# Patient Record
Sex: Female | Born: 1935 | Race: White | Hispanic: No | Smoking: Never smoker
Health system: Southern US, Community
[De-identification: ages and names within clinical notes are randomized; demographics above are authoritative.]

## PROBLEM LIST (undated history)

## (undated) DIAGNOSIS — F419 Anxiety disorder, unspecified: Secondary | ICD-10-CM

## (undated) DIAGNOSIS — I639 Cerebral infarction, unspecified: Secondary | ICD-10-CM

## (undated) DIAGNOSIS — F329 Major depressive disorder, single episode, unspecified: Secondary | ICD-10-CM

## (undated) DIAGNOSIS — R21 Rash and other nonspecific skin eruption: Secondary | ICD-10-CM

## (undated) DIAGNOSIS — L821 Other seborrheic keratosis: Secondary | ICD-10-CM

## (undated) DIAGNOSIS — R3129 Other microscopic hematuria: Secondary | ICD-10-CM

## (undated) DIAGNOSIS — K922 Gastrointestinal hemorrhage, unspecified: Secondary | ICD-10-CM

## (undated) DIAGNOSIS — I10 Essential (primary) hypertension: Secondary | ICD-10-CM

## (undated) DIAGNOSIS — H539 Unspecified visual disturbance: Secondary | ICD-10-CM

## (undated) DIAGNOSIS — I442 Atrioventricular block, complete: Secondary | ICD-10-CM

## (undated) DIAGNOSIS — S32010A Wedge compression fracture of first lumbar vertebra, initial encounter for closed fracture: Secondary | ICD-10-CM

## (undated) DIAGNOSIS — J309 Allergic rhinitis, unspecified: Secondary | ICD-10-CM

## (undated) DIAGNOSIS — T8859XA Other complications of anesthesia, initial encounter: Secondary | ICD-10-CM

## (undated) DIAGNOSIS — N6019 Diffuse cystic mastopathy of unspecified breast: Secondary | ICD-10-CM

## (undated) DIAGNOSIS — T4145XA Adverse effect of unspecified anesthetic, initial encounter: Secondary | ICD-10-CM

## (undated) DIAGNOSIS — F32A Depression, unspecified: Secondary | ICD-10-CM

## (undated) DIAGNOSIS — M1990 Unspecified osteoarthritis, unspecified site: Secondary | ICD-10-CM

## (undated) HISTORY — DX: Rash and other nonspecific skin eruption: R21

## (undated) HISTORY — DX: Unspecified visual disturbance: H53.9

## (undated) HISTORY — DX: Anxiety disorder, unspecified: F41.9

## (undated) HISTORY — DX: Other seborrheic keratosis: L82.1

## (undated) HISTORY — DX: Wedge compression fracture of first lumbar vertebra, initial encounter for closed fracture: S32.010A

## (undated) HISTORY — DX: Major depressive disorder, single episode, unspecified: F32.9

## (undated) HISTORY — DX: Depression, unspecified: F32.A

## (undated) HISTORY — DX: Essential (primary) hypertension: I10

## (undated) HISTORY — PX: INSERT / REPLACE / REMOVE PACEMAKER: SUR710

## (undated) HISTORY — DX: Gastrointestinal hemorrhage, unspecified: K92.2

## (undated) HISTORY — DX: Unspecified osteoarthritis, unspecified site: M19.90

## (undated) HISTORY — DX: Diffuse cystic mastopathy of unspecified breast: N60.19

## (undated) HISTORY — DX: Allergic rhinitis, unspecified: J30.9

## (undated) HISTORY — DX: Atrioventricular block, complete: I44.2

## (undated) HISTORY — DX: Cerebral infarction, unspecified: I63.9

## (undated) HISTORY — DX: Other microscopic hematuria: R31.29

---

## 1999-02-10 ENCOUNTER — Encounter: Payer: Self-pay | Admitting: Obstetrics and Gynecology

## 1999-02-10 ENCOUNTER — Encounter: Admission: RE | Admit: 1999-02-10 | Discharge: 1999-02-10 | Payer: Self-pay | Admitting: Obstetrics and Gynecology

## 2000-02-12 ENCOUNTER — Encounter: Admission: RE | Admit: 2000-02-12 | Discharge: 2000-02-12 | Payer: Self-pay | Admitting: Obstetrics and Gynecology

## 2000-02-12 ENCOUNTER — Encounter: Payer: Self-pay | Admitting: Obstetrics and Gynecology

## 2001-02-13 ENCOUNTER — Encounter: Admission: RE | Admit: 2001-02-13 | Discharge: 2001-02-13 | Payer: Self-pay | Admitting: Obstetrics and Gynecology

## 2001-02-13 ENCOUNTER — Encounter: Payer: Self-pay | Admitting: Obstetrics and Gynecology

## 2002-02-15 ENCOUNTER — Encounter: Admission: RE | Admit: 2002-02-15 | Discharge: 2002-02-15 | Payer: Self-pay | Admitting: Obstetrics and Gynecology

## 2002-02-15 ENCOUNTER — Encounter: Payer: Self-pay | Admitting: Obstetrics and Gynecology

## 2002-08-15 ENCOUNTER — Encounter: Admission: RE | Admit: 2002-08-15 | Discharge: 2002-08-15 | Payer: Self-pay | Admitting: Internal Medicine

## 2002-08-15 ENCOUNTER — Encounter: Payer: Self-pay | Admitting: Internal Medicine

## 2003-02-18 ENCOUNTER — Encounter: Admission: RE | Admit: 2003-02-18 | Discharge: 2003-02-18 | Payer: Self-pay | Admitting: Obstetrics and Gynecology

## 2003-02-18 ENCOUNTER — Encounter: Payer: Self-pay | Admitting: Obstetrics and Gynecology

## 2004-02-19 ENCOUNTER — Encounter: Admission: RE | Admit: 2004-02-19 | Discharge: 2004-02-19 | Payer: Self-pay | Admitting: Obstetrics and Gynecology

## 2004-05-06 ENCOUNTER — Ambulatory Visit (HOSPITAL_COMMUNITY): Admission: RE | Admit: 2004-05-06 | Discharge: 2004-05-06 | Payer: Self-pay | Admitting: Cardiology

## 2005-02-26 ENCOUNTER — Encounter: Admission: RE | Admit: 2005-02-26 | Discharge: 2005-02-26 | Payer: Self-pay | Admitting: Obstetrics and Gynecology

## 2005-03-11 ENCOUNTER — Encounter: Admission: RE | Admit: 2005-03-11 | Discharge: 2005-03-11 | Payer: Self-pay | Admitting: Obstetrics and Gynecology

## 2006-02-28 ENCOUNTER — Encounter: Admission: RE | Admit: 2006-02-28 | Discharge: 2006-02-28 | Payer: Self-pay | Admitting: Obstetrics and Gynecology

## 2006-05-27 ENCOUNTER — Inpatient Hospital Stay (HOSPITAL_COMMUNITY): Admission: EM | Admit: 2006-05-27 | Discharge: 2006-05-31 | Payer: Self-pay | Admitting: Emergency Medicine

## 2007-03-03 ENCOUNTER — Encounter: Admission: RE | Admit: 2007-03-03 | Discharge: 2007-03-03 | Payer: Self-pay | Admitting: Internal Medicine

## 2008-01-15 ENCOUNTER — Encounter: Admission: RE | Admit: 2008-01-15 | Discharge: 2008-01-15 | Payer: Self-pay | Admitting: Family Medicine

## 2008-03-04 ENCOUNTER — Encounter: Admission: RE | Admit: 2008-03-04 | Discharge: 2008-03-04 | Payer: Self-pay | Admitting: Obstetrics and Gynecology

## 2009-03-05 ENCOUNTER — Encounter: Admission: RE | Admit: 2009-03-05 | Discharge: 2009-03-05 | Payer: Self-pay | Admitting: Obstetrics and Gynecology

## 2009-03-14 ENCOUNTER — Encounter: Admission: RE | Admit: 2009-03-14 | Discharge: 2009-03-14 | Payer: Self-pay | Admitting: Obstetrics and Gynecology

## 2010-03-06 ENCOUNTER — Encounter: Admission: RE | Admit: 2010-03-06 | Discharge: 2010-03-06 | Payer: Self-pay | Admitting: Internal Medicine

## 2010-05-16 ENCOUNTER — Encounter: Payer: Self-pay | Admitting: Obstetrics and Gynecology

## 2010-09-11 NOTE — Consult Note (Signed)
NAMESYNAI, [name] NO.:  [id number]   MEDICAL RECORD NO.:  [id number]          PATIENT TYPE:  INP   LOCATION:  1826                         FACILITY:  [facility]   PHYSICIAN:  Corky Crafts, MDDATE OF BIRTH:  12-11-1924   DATE OF CONSULTATION:  05/27/2006  DATE OF DISCHARGE:                                 CONSULTATION   REASON FOR CONSULTATION:  Bradycardia.   HISTORY OF PRESENT ILLNESS:  The patient is a 77 year old woman who  underwent cath in January2006 showing no significant coronary artery  disease. She had been on diltiazem in the past for diastolic  dysfunction, but had a heart rate in the 30s. Diltiazem was stopped, and  her heart rate improved to the 50s to 60s range. Earlier this week she  experienced tachy palpitations and called her primary care doctor. He  called in some Toprol XL, and the patient tachy palpitations did  resolve. She then began having dyspnea on exertion and came to the  office today. She was found to have sinus bradycardia with 2:1 AV block  with a heart rate around 30 beats per minute. She was hemodynamically  stable. She does not complain of chest pain. She does get somewhat  lightheaded if she walks around. While lying in bed she feels okay. She  was given atropine in the emergency room without any significant change  in her heart rate.   PAST MEDICAL HISTORY:  1. Hypertension.  2. Question of diastolic dysfunction.  3. History of fibroadenoma.   ALLERGIES:  ANTIHISTAMINES, MICARDIS.   MEDICATIONS:  1. Aspirin 81 mg a day.  2. Maxzide 37.5/25.  3. Norvasc 5 mg a day.  4. Fish oil.  5. Toprol XL 25 mg a day for the past four days.  6. Multivitamin.   PAST SURGICAL HISTORY:  Breast surgery.   FAMILY HISTORY:  Mother had heart disease and died at 23. Father died of  lung cancer at age 48.   SOCIAL HISTORY:  The patient is widowed. She has one adult daughter. She  lives alone. She does not smoke, drink or use  any drugs. She walks one  mile six days per week.   REVIEW OF SYSTEMS:  She has no weight loss. No nausea or vomiting,  lightheadedness and slow heart rate, dyspnea on exertion as described  above. All other systems negative.   PHYSICAL EXAMINATION:  VITAL SIGNS:  Blood pressure is 142/52, pulse 28.  HEENT:  Head normocephalic, atraumatic. Eyes:  Extraocular movements  intact.  NECK:  No carotid bruits.  CARDIOVASCULAR:  Bradycardic, S1, S2.  LUNGS:  Clear to auscultation bilaterally.  ABDOMEN:  Soft, nontender, nondistended.  EXTREMITIES:  Showed no edema.  SKIN:  Warm and dry.  NEUROLOGICAL:  No focal motor or sensory deficits.  PSYCHIATRIC:  Normal mood and affect.  BACK:  No kyphosis.   LABORATORY DATA:  Lab work shows hematocrit of 43.5. Creatinine 3.6.  Troponin less than 0.05.   Chest x-ray is pending. EKG shows sinus bradycardia with atrial rate of  about 59 beats per minute with 2:1 AV block. Ventricular rate is about  29 beats per minute.   ASSESSMENT/PLAN:  A 77 year old with possible tachy-brady syndrome. To  my knowledge we have yet to document tachy arrhythmias on a rhythm  strip.   PLAN:  1. Cardiac:  Will hold all rate slowing drugs at this point. The      patient is currently hemodynamically stable and has not required      transcutaneous pacing at any point. Will continue to monitor. If      she does require transcutaneous pacing will consider placing a      temporary pacemaker.  2. Second degree heart block is likely related to beta blocker. As      metoprolol wears off her heart rate should improve.  3. Will monitor her in a step-down bed.  4. If the patient's heart rate improves and we do not document any      tachy arrhythmias while she is in the hospital, I would consider      sending her home with an event monitor. If we are able to document      tachy arrhythmias she would likely be a candidate for a pacemaker.      She clearly will not tolerate  even low-dose rate slowing drug.      Eventually her Norvasc can be continued for blood pressure control.  5. We will follow the patient while she is in the hospital.      Corky Crafts, MD  Electronically Signed     JSV/MEDQ  D:  05/27/2006  T:  05/27/2006  Job:  [id number]

## 2010-09-11 NOTE — Cardiovascular Report (Signed)
NAMEELIZABETH, PAULSEN NO.:  [id number]   MEDICAL RECORD NO.:  [id number]          PATIENT TYPE:  INP   LOCATION:  2926                         FACILITY:  [facility]   PHYSICIAN:  Francisca December, M.D.  DATE OF BIRTH:  [birth year]   DATE OF PROCEDURE:  05/28/2006  DATE OF DISCHARGE:                            CARDIAC CATHETERIZATION   PROCEDURE:  Temporary pacemaker insertion.   INDICATIONS:  An 77 year old woman admitted on beta blockers with  complete heart block.  Now greater than 36 hours since last dose of beta  blocker, persists with complete heart block, nodal escape at a rate of  25 to 35 beats per minute.  She is brought to the catheterization  laboratory at this time for insertion of a temporary transvenous pacing  wire in anticipation of the need for a permanent pacemaker insertion.   PROCEDURE NOTE:  The patient is brought to the cardiac catheterization  laboratory in a fasting state.  The right groin was prepped and draped  in the usual sterile fashion.  Local anesthesia was obtained with  infiltration of 1% lidocaine.  A 6-French catheter sheath was inserted  percutaneously into the right femoral vein utilizing an anterior  approach over the guiding J-wire.  A 5-French balloon flow directed  pacing wire was advanced into the right ventricular apex without  difficulty.  Excellent pacing capture was obtained with rate of 70 beats  per minute and an output 5 MA.  The sheath was then sutured into place  and the patient was transported back to the CCU in stable condition.   IMPRESSION:  Successful temporary right ventricular pacing wire placed  under fluoroscopy.      Francisca December, M.D.  Electronically Signed     JHE/MEDQ  D:  05/28/2006  T:  05/28/2006  Job:  [id number]

## 2010-09-11 NOTE — Cardiovascular Report (Signed)
[name], LOCKAMY NO.:  [id number]   MEDICAL RECORD NO.:  [id number]          PATIENT TYPE:  OIB   LOCATION:  2899                         FACILITY:  [facility]   PHYSICIAN:  Armanda Magic, M.D.     DATE OF BIRTH:  [birth year]   DATE OF PROCEDURE:  05/06/2004  DATE OF DISCHARGE:                              CARDIAC CATHETERIZATION   PROCEDURE:  Left heart catheterization, coronary angiography, left  ventriculography.   OPERATOR:  Armanda Magic, M.D.   INDICATIONS:  Chest pain, shortness of breath.   COMPLICATIONS:  None.   IV ACCESS:  Via a right femoral artery 6 French sheath.   This is a 77 year old white female who has been having significant  exertional chest pain and shortness of breath.  She underwent an Adenosine  Cardiolite study which showed a fixed defect in the basal septum consistent  with the presence of attenuation artifact but a reversible defect in the  apex and now presents for cardiac catheterization.   The patient was brought to the cardiac catheterization laboratory in the  fasting non sedated state.  Informed consent was obtained.  The patient was  connected to continuous heart rate and pulse oximetry monitoring,  intermittent blood pressure monitoring.  The right groin was prepped and  draped in a sterile fashion.  A 1% Xylocaine was used for local anesthesia.  Using the modified Seldinger technique, a 6 French sheath was placed in the  right femoral artery.  Under fluoroscopic guidance, a 6 [place] JL4 catheter  was placed in the left coronary artery.  Monocine films were taken at 30  degree RAO and 40 degree LAO views.  This catheter was next changed out over  our guide wire for a 6 [place] JR4 catheter was placed under fluoroscopic  guidance in the right coronary artery.  Multiple cine films were taken at 30  degree RAO, 40 degree LAO views.  This catheter was next changed out over  our guide wire for a 6 French angled pigtail catheter  which was placed under  fluoroscopic guidance in the left ventricular cavity.  The left  ventriculography was performed in the 30 degree RAO views and a total of 30  cc of contrast at 15 cc per second.  The catheter was then pulled back  across the aortic valve with no significant gradient noted.  At the end of  the procedure, all catheters and sheaths were removed.  Manual compression  was performed until an adequate hemostasis was obtained.  The patient was  transferred back to the room in stable condition.   RESULTS:  1.  Left main coronary artery is widely patent and bifurcates in the left      anterior descending artery and left circumflex artery.  The left      anterior descending artery is widely patent throughout its course to the      apex giving rise to one diagonal which is a fairly large diagonal with a      30% proximal narrowing.  It bifurcates distally into two daughter      branches, both of which are  widely patent.  The left circumflex is      widely patent throughout its course and traverses the AV groove.  It      gives rise distally to a very large obtuse marginal branch which      trifurcates into three daughter branches and is widely patent.  2.  The right coronary artery is widely patent throughout its course and      distally bifurcates into the posterior descending artery and      posterolateral artery, both of which are widely patent.  3.  Left ventriculography shows normal left ventricular systolic function,      ejection fraction 65%, LV pressure 151/14 mmHg, aortic pressure 151/72      mmHg, LVEDP 24 mmHg.   ASSESSMENT:  1.  Nonobstructive coronary disease.  2.  Normal left ventricular function.  3.  Elevated LVEDP consistent with diastolic dysfunction which could be      causing her shortness of breath and chest pain.   PLAN:  Discharge to administer IV fluid bed rest, Cardizem CD 180 mg a day  for diastolic dysfunction, pulmonary function tests as an  outpatient for  shortness of breath to rule out pulmonary disease.  Follow up with me in two  weeks.  Check a 2D echocardiogram as an outpatient if not already done.      TT/MEDQ  D:  05/06/2004  T:  05/06/2004  Job:  [id number]

## 2010-09-11 NOTE — Op Note (Signed)
NAMESAUMYA, HUKILL NO.:  [id number]   MEDICAL RECORD NO.:  [id number]          PATIENT TYPE:  INP   LOCATION:  2926                         FACILITY:  [facility]   PHYSICIAN:  [name], M.D.  DATE OF BIRTH:  1924/08/30   DATE OF PROCEDURE:  05/30/2006  DATE OF DISCHARGE:                               OPERATIVE REPORT   PROCEDURES PERFORMED:  1. Insertion of dual-chamber permanent transvenous pacemaker.  2. Left subclavian venogram.   INDICATIONS:  [name] is a 77 year old woman with a history of  nonobstructive CAD and hypertension.  She was admitted 3 days ago with a  marked bradycardia thought secondary to beta blocker therapy.  However,  with resolution of the beta blocker therapy, she continued to have  evidence of high-degree heart block and intermittent complete heart  block.  Heart rate was in a 20-30 beat per minute range.  She required  temporary transvenous pacemaker which has been in place for 48 hours.  She is brought to the catheterization laboratory at this time for  insertion of a permanent dual-chamber transvenous pacemaker.   PROCEDURAL NOTE:  The patient was brought to the cardiac catheterization  laboratory in the fasting state.  The left prepectoral region was  prepped and draped in the usual sterile fashion.  Local anesthesia was  obtained with infiltration of 1% lidocaine throughout the left  prepectoral region.  A left subclavian venogram was then performed with  a peripheral injection of 20 cc of Omnipaque.  A digital cineangiogram  was obtained and road mapped to guide future left subclavian puncture.  The venogram did demonstrate the vein to be widely patent and coursing  in a normal fashion over the anterior surface of the first rib and  beneath the middle third of the clavicle.  There was no evidence for  persistence of the left superior vena cava.   A 6-7 cm incision was then made in the deltopectoral groove. and  this  was carried down by sharp dissection with electrocautery to the  prepectoral fascia.  There, a plane was lifted and a pocket formed  inferiorly and medially utilizing blunt dissection.  Hemostasis was  obtained.  Two separate left subclavian punctures were then performed  through which was passed a 0.038-inch tight J guidewire.  A figure-of-  eight hemostasis suture was required around the pectoralis insertion  site.  A 7-French tearaway sheath and dilator was then advanced over a  wire.  The wire and dilator were removed.  The ventricular lead was  advanced to the level of the right atrium, and the sheath was torn away.  The lead was then manipulated onto the right ventricular septum using  fluoroscopic landmarks and standard techniques.  This was an active  fixation lead, and the screw was advanced as appropriate.  It was tested  for adequate pacing parameters. and these are reported below.  It was  also tested for diaphragmatic pacing at 10 volts, and none was found.  The lead was then sutured into place using three separate 0 silk  ligatures.  Over the remaining guidewire, a second tearaway  sheath and  dilator were advanced.  The dilator and wire were removed.  The atrial  lead was advanced to the level of the right atrium, and the sheath was  torn away.  Using standard technique and fluoroscopic landmarks, the  lead was manipulated into the right atrial appendage.  There, excellent  pacing parameters were obtained, as will be noted below.  This was also  an active fixation lead, and a screw was advanced as appropriate.  The  lead was tested for diaphragmatic pacing at 10 volts, and none was  found.  The lead was then sutured into place using three separate 0 silk  ligatures.  The kanamycin-soaked gauze was then removed from the  pocket,and the pocket was copiously irrigated using 1% kanamycin  solution.  The leads were then attached to the pacing generator,  carefully  identifying each by its serial number, and placing each into  the appropriate receptacle.  Each lead was tightened into place and  tested for security.  The leads were then wound beneath the pacing  generator, and the generator was placed in the pocket.  An 0 silk  anchoring suture was applied.  The wound was then closed using 2-0  Vicryl in a running fashion for the subcutaneous layer.  The skin was  approximated using 4-0 Vicryl in a running subcuticular fashion.  Steri-  Strips and a sterile dressing were applied, and the patient was  transported to the recovery area in A sense, V pace mode.   EQUIPMENT DATA:  The pacing generator is a Medtronic Welda, model  number [id number], are all once serial number [id number].  The atrial lead  is a Medtronic model number Z7227316, serial number [id number].  The  ventricular lead is a Medtronic model number Z7227316, serial number  [id number].   PACING DATA:  The ventricular lead detected a 12 mV R wave, but this was  paced.  There was no underlying ventricular rhythm.  The threshold was  0.6 V at 0.5 msec pulse width.  The impedance was 816 ohms, resulting in  a current at capture threshold of 0.9 MA.  The atrial lead detected a  4.1 mV P wave.  The pacing threshold was 0.6 V at 0.5 msec pulse width.  The impedance was 619 ohms resulting in a current at capture threshold  of 1.3 MA.      [name], M.D.  Electronically Signed     JHE/MEDQ  D:  05/30/2006  T:  05/30/2006  Job:  [id number]   cc:   Candyce Churn, M.D.

## 2010-09-11 NOTE — Discharge Summary (Signed)
[name], [name] NO.:  [id number]   MEDICAL RECORD NO.:  [id number]          PATIENT TYPE:  INP   LOCATION:  3703                         FACILITY:  [facility]   PHYSICIAN:  Candyce Churn, M.D.DATE OF BIRTH:  [birth year]   DATE OF ADMISSION:  05/27/2006  DATE OF DISCHARGE:  05/31/2006                               DISCHARGE SUMMARY   DISCHARGE DIAGNOSES:  1. Bradycardia with a second degree heart block with heart rate in the      30s - resolved with placement of a permanent pacemaker.  2. Hypertension.  3. Nonobstructive coronary arteries via catheterization January of      2006.  4. History of bilateral breast fibroadenomas.   DISCHARGE MEDICATIONS:  1. Toprol-XL 25 mg daily.  2. Aspirin 81 mg daily.  3. Norvasc 5 mg daily.  4. Vicodin 1 p.o. q.4 hours p.r.n. pain.   CONSULTATIONS:  Cardiology - Dr. Everette Rank.   PROCEDURES:  Placement of dual-chamber permanent pacemaker, May 30, 2006, Dr. Corliss Marcus.   HOSPITAL COURSE:  [name] is a very pleasant 77 year old  female with hypertension and history of bradycardia and palpitations,  who presented to [facility] on May 27, 2006 with a  heart rate of 29.  She is in second degree type 2 AV block.  Toprol-XL  had been started several days before for tachycardia and palpitations.  She was having some chest tightness with exertion and no sweating, but  mild shortness of breath.   Patient was admitted and monitored off a beta blocker therapy and heart  rate remained in the 20s.   She was seen in consultation by Dr. Everette Rank and ultimately had a  permanent pacemaker placed on May 30, 2006.  This was a dual-  chamber, Medtronic [place], model number [id number] with a serial number of  [id number].   Atrial lead is a Medtronic, model number Z7227316, serial number [id number]  and the ventricular lead is a Medtronic, model number 5706, serial  number [id number].   The patient tolerated the insertion well and was pacing well with normal  pacer interrogation on May 30, 2006, status post placement.   For atrial measurements, her P/R-wave was 4.1 millivolt and pedence was  619 ohms, threshold was 0.6 and current was 1.3 milliamps.  For  ventricular lead, P/R-wave was 12.0 millivolt, pedence was 816 ohms,  threshold was 0.06.  Current was 0.9 milliamps.   Patient did have some mild hypertension while hospitalized and was  restarted on Toprol-XL 25 mg daily at discharge, which can now be safely  done with a permanent pacemaker in place.   The patient will be seen in followup in 1 week in my office and as per  cardiology.      Candyce Churn, M.D.  Electronically Signed     RNG/MEDQ  D:  05/31/2006  T:  05/31/2006  Job:  [id number]   cc:   Francisca December, M.D.  Corky Crafts, MD

## 2010-09-11 NOTE — H&P (Signed)
NAMEMARYAN, [name] NO.:  [id number]   MEDICAL RECORD NO.:  [id number]          PATIENT TYPE:  INP   LOCATION:  2926                         FACILITY:  [facility]   PHYSICIAN:  [name], M.D.DATE OF BIRTH:  Feb 08, 1925   DATE OF ADMISSION:  05/27/2006  DATE OF DISCHARGE:                              HISTORY & PHYSICAL   PRIMARY CARE PHYSICIAN:  [name].   CARDIAC ASSESSMENT:  [name].   CHIEF COMPLAINT:  1. Near syncope.  2. Bradycardia.  3. Second degree heart block on EKG.   HISTORY OF PRESENT ILLNESS:  [name] is a very pleasant  77 year old female with hypertension, bradycardia, palpitations who  presents with a heart rate of 29 and second degree type 2 heart block.  Toprol XL 25 mg a day was started on May 23, 2006 for complaint of  tachycardia and palpitations.  She is now having some chest discomfort  with exertion-feels tight substernally.  Denies any diaphoresis and is  moderately short of breath with minimal exertion.  Admitted now for  further work up and with probable need for cardiac pacing.   MEDICATIONS:  1. Aspirin 81 mg a day.  2. Fish oil 3 grams daily.  3. Multivitamin 1 daily.  4. Norvasc 5 mg daily.  5. Toprol XL 25 mg for 4 days prior to admission and then will be      discontinued.   ALLERGIES:  ANTIHISTAMINES causes nightmares.  MICARDIS-patient did not  tolerate.  ATIVAN caused confusion and delirium.   PAST MEDICAL HISTORY:  1. Hypertension.  2. Fibrocystic breast disease.  3. DJD of the hips.  4. Nonobstructive coronary artery disease-single vessel-diagonal off      LAD from cardiac catheterizations from January 2006.   PAST SURGICAL HISTORY:  Right and left breast lumpectomies.   FAMILY HISTORY:  1. Lung cancer.  2. Coronary artery disease.  3. Drug hypertension.   HABITS:  No tobacco or alcohol.   SOCIAL HISTORY:  Patient is widowed.  Has been married twice.   Both  husbands had strokes.  Very supportive daughter, [name].   REVIEW OF SYSTEMS:  As per HPI.  She does have shortness of breath on  exertion.  No fevers or chills, abdominal pain, dysuria, change of bowel  habits or peripheral edema.   PHYSICAL EXAMINATION:  GENERAL:  Alert and oriented x3.  Pulse is 30 and  regular.  Blood pressure is 142/64.  Respiratory rate is 16, nonlabored.  Weight is 130.  HEENT EXAM:  Atraumatic, normocephalic.  NECK:  Supple without JVD or thyromegaly or carotid bruits.  CHEST:  Was clear to auscultation.  CARDIAC EXAM:  Regular rhythm, very bradycardic.  1/6 systolic ejection  murmur noted over the right second intracostal space.  ABDOMEN:  Soft, nontender.  EXTREMITIES:  Without edema.  NEUROLOGICAL:  Nonfocal.   EKG reveals sinus rhythm with second degree type 2 AV block.  Ventricular rate is 29.   ASSESSMENT:  Severe heart block-question beta blocker induced only.  She  is having some chest tightness which may be secondary to coronary  insufficiency secondary to bradycardia.   PLAN:  Will admit and monitor.  Place on oxygen and discontinue Toprol  and check cardiac enzymes and ask [name] to consult.      [name], M.D.  Electronically Signed     RNG/MEDQ  D:  05/30/2006  T:  05/30/2006  Job:  [id number]   cc:   Maria Crafts, MD

## 2011-02-03 ENCOUNTER — Other Ambulatory Visit: Payer: Self-pay | Admitting: Internal Medicine

## 2011-02-03 DIAGNOSIS — Z1231 Encounter for screening mammogram for malignant neoplasm of breast: Secondary | ICD-10-CM

## 2011-03-08 ENCOUNTER — Ambulatory Visit
Admission: RE | Admit: 2011-03-08 | Discharge: 2011-03-08 | Disposition: A | Discharge status: Home / Self Care | Payer: Medicare Other | Source: Ambulatory Visit | Attending: Internal Medicine | Admitting: Internal Medicine

## 2011-03-08 DIAGNOSIS — Z1231 Encounter for screening mammogram for malignant neoplasm of breast: Secondary | ICD-10-CM

## 2011-04-28 DIAGNOSIS — Z950 Presence of cardiac pacemaker: Secondary | ICD-10-CM | POA: Diagnosis not present

## 2011-07-08 DIAGNOSIS — M25519 Pain in unspecified shoulder: Secondary | ICD-10-CM | POA: Diagnosis not present

## 2011-07-08 DIAGNOSIS — H6120 Impacted cerumen, unspecified ear: Secondary | ICD-10-CM | POA: Diagnosis not present

## 2011-07-22 DIAGNOSIS — R300 Dysuria: Secondary | ICD-10-CM | POA: Diagnosis not present

## 2011-08-02 DIAGNOSIS — Z950 Presence of cardiac pacemaker: Secondary | ICD-10-CM | POA: Diagnosis not present

## 2011-08-02 DIAGNOSIS — I495 Sick sinus syndrome: Secondary | ICD-10-CM | POA: Diagnosis not present

## 2011-08-16 DIAGNOSIS — R300 Dysuria: Secondary | ICD-10-CM | POA: Diagnosis not present

## 2011-08-23 DIAGNOSIS — Z79899 Other long term (current) drug therapy: Secondary | ICD-10-CM | POA: Diagnosis not present

## 2011-08-23 DIAGNOSIS — R350 Frequency of micturition: Secondary | ICD-10-CM | POA: Diagnosis not present

## 2011-08-23 DIAGNOSIS — E538 Deficiency of other specified B group vitamins: Secondary | ICD-10-CM | POA: Diagnosis not present

## 2011-08-23 DIAGNOSIS — Z0000 Encounter for general adult medical examination without abnormal findings: Secondary | ICD-10-CM | POA: Diagnosis not present

## 2011-08-23 DIAGNOSIS — I10 Essential (primary) hypertension: Secondary | ICD-10-CM | POA: Diagnosis not present

## 2011-08-23 DIAGNOSIS — F329 Major depressive disorder, single episode, unspecified: Secondary | ICD-10-CM | POA: Diagnosis not present

## 2011-08-23 DIAGNOSIS — E782 Mixed hyperlipidemia: Secondary | ICD-10-CM | POA: Diagnosis not present

## 2011-08-23 DIAGNOSIS — Z1331 Encounter for screening for depression: Secondary | ICD-10-CM | POA: Diagnosis not present

## 2011-08-23 DIAGNOSIS — E559 Vitamin D deficiency, unspecified: Secondary | ICD-10-CM | POA: Diagnosis not present

## 2011-10-10 DIAGNOSIS — L02419 Cutaneous abscess of limb, unspecified: Secondary | ICD-10-CM | POA: Diagnosis not present

## 2011-10-10 DIAGNOSIS — IMO0001 Reserved for inherently not codable concepts without codable children: Secondary | ICD-10-CM | POA: Diagnosis not present

## 2011-10-10 DIAGNOSIS — L03119 Cellulitis of unspecified part of limb: Secondary | ICD-10-CM | POA: Diagnosis not present

## 2011-11-02 DIAGNOSIS — M67919 Unspecified disorder of synovium and tendon, unspecified shoulder: Secondary | ICD-10-CM | POA: Diagnosis not present

## 2011-11-16 DIAGNOSIS — Z950 Presence of cardiac pacemaker: Secondary | ICD-10-CM | POA: Diagnosis not present

## 2011-11-16 DIAGNOSIS — I10 Essential (primary) hypertension: Secondary | ICD-10-CM | POA: Diagnosis not present

## 2011-11-16 DIAGNOSIS — E782 Mixed hyperlipidemia: Secondary | ICD-10-CM | POA: Diagnosis not present

## 2011-11-25 DIAGNOSIS — Z950 Presence of cardiac pacemaker: Secondary | ICD-10-CM | POA: Diagnosis not present

## 2011-12-07 DIAGNOSIS — S0990XA Unspecified injury of head, initial encounter: Secondary | ICD-10-CM | POA: Diagnosis not present

## 2011-12-07 DIAGNOSIS — T148XXA Other injury of unspecified body region, initial encounter: Secondary | ICD-10-CM | POA: Diagnosis not present

## 2011-12-07 DIAGNOSIS — S0100XA Unspecified open wound of scalp, initial encounter: Secondary | ICD-10-CM | POA: Diagnosis not present

## 2012-01-07 DIAGNOSIS — R55 Syncope and collapse: Secondary | ICD-10-CM | POA: Diagnosis not present

## 2012-01-07 DIAGNOSIS — S0990XA Unspecified injury of head, initial encounter: Secondary | ICD-10-CM | POA: Diagnosis not present

## 2012-01-07 DIAGNOSIS — M542 Cervicalgia: Secondary | ICD-10-CM | POA: Diagnosis not present

## 2012-01-31 DIAGNOSIS — H3530 Unspecified macular degeneration: Secondary | ICD-10-CM | POA: Diagnosis not present

## 2012-01-31 DIAGNOSIS — H04129 Dry eye syndrome of unspecified lacrimal gland: Secondary | ICD-10-CM | POA: Diagnosis not present

## 2012-01-31 DIAGNOSIS — H02059 Trichiasis without entropian unspecified eye, unspecified eyelid: Secondary | ICD-10-CM | POA: Diagnosis not present

## 2012-01-31 DIAGNOSIS — Z961 Presence of intraocular lens: Secondary | ICD-10-CM | POA: Diagnosis not present

## 2012-02-02 ENCOUNTER — Other Ambulatory Visit: Payer: Self-pay | Admitting: Internal Medicine

## 2012-02-02 DIAGNOSIS — Z1231 Encounter for screening mammogram for malignant neoplasm of breast: Secondary | ICD-10-CM

## 2012-02-07 DIAGNOSIS — Z23 Encounter for immunization: Secondary | ICD-10-CM | POA: Diagnosis not present

## 2012-02-07 DIAGNOSIS — M546 Pain in thoracic spine: Secondary | ICD-10-CM | POA: Diagnosis not present

## 2012-02-07 DIAGNOSIS — M542 Cervicalgia: Secondary | ICD-10-CM | POA: Diagnosis not present

## 2012-03-03 DIAGNOSIS — H6120 Impacted cerumen, unspecified ear: Secondary | ICD-10-CM | POA: Diagnosis not present

## 2012-03-08 ENCOUNTER — Ambulatory Visit
Admission: RE | Admit: 2012-03-08 | Discharge: 2012-03-08 | Disposition: A | Discharge status: Home / Self Care | Payer: Medicare Other | Source: Ambulatory Visit | Attending: Internal Medicine | Admitting: Internal Medicine

## 2012-03-08 DIAGNOSIS — Z1231 Encounter for screening mammogram for malignant neoplasm of breast: Secondary | ICD-10-CM

## 2012-05-04 DIAGNOSIS — I10 Essential (primary) hypertension: Secondary | ICD-10-CM | POA: Diagnosis not present

## 2012-05-04 DIAGNOSIS — M19019 Primary osteoarthritis, unspecified shoulder: Secondary | ICD-10-CM | POA: Diagnosis not present

## 2012-05-04 DIAGNOSIS — L570 Actinic keratosis: Secondary | ICD-10-CM | POA: Diagnosis not present

## 2012-05-04 DIAGNOSIS — M542 Cervicalgia: Secondary | ICD-10-CM | POA: Diagnosis not present

## 2012-06-05 DIAGNOSIS — M549 Dorsalgia, unspecified: Secondary | ICD-10-CM | POA: Diagnosis not present

## 2012-08-15 DIAGNOSIS — I10 Essential (primary) hypertension: Secondary | ICD-10-CM | POA: Diagnosis not present

## 2012-08-15 DIAGNOSIS — R609 Edema, unspecified: Secondary | ICD-10-CM | POA: Diagnosis not present

## 2012-09-19 DIAGNOSIS — I809 Phlebitis and thrombophlebitis of unspecified site: Secondary | ICD-10-CM | POA: Diagnosis not present

## 2012-09-19 DIAGNOSIS — M1710 Unilateral primary osteoarthritis, unspecified knee: Secondary | ICD-10-CM | POA: Diagnosis not present

## 2012-09-19 DIAGNOSIS — M76899 Other specified enthesopathies of unspecified lower limb, excluding foot: Secondary | ICD-10-CM | POA: Diagnosis not present

## 2012-10-09 DIAGNOSIS — I495 Sick sinus syndrome: Secondary | ICD-10-CM | POA: Diagnosis not present

## 2012-10-09 DIAGNOSIS — Z950 Presence of cardiac pacemaker: Secondary | ICD-10-CM | POA: Diagnosis not present

## 2012-11-16 DIAGNOSIS — I10 Essential (primary) hypertension: Secondary | ICD-10-CM | POA: Diagnosis not present

## 2012-11-16 DIAGNOSIS — R609 Edema, unspecified: Secondary | ICD-10-CM | POA: Diagnosis not present

## 2012-11-16 DIAGNOSIS — E782 Mixed hyperlipidemia: Secondary | ICD-10-CM | POA: Diagnosis not present

## 2012-11-16 DIAGNOSIS — Z950 Presence of cardiac pacemaker: Secondary | ICD-10-CM | POA: Diagnosis not present

## 2012-11-21 DIAGNOSIS — M542 Cervicalgia: Secondary | ICD-10-CM | POA: Diagnosis not present

## 2012-12-28 DIAGNOSIS — L723 Sebaceous cyst: Secondary | ICD-10-CM | POA: Diagnosis not present

## 2012-12-28 DIAGNOSIS — C44319 Basal cell carcinoma of skin of other parts of face: Secondary | ICD-10-CM | POA: Diagnosis not present

## 2012-12-28 DIAGNOSIS — L738 Other specified follicular disorders: Secondary | ICD-10-CM | POA: Diagnosis not present

## 2012-12-28 DIAGNOSIS — L570 Actinic keratosis: Secondary | ICD-10-CM | POA: Diagnosis not present

## 2013-02-07 ENCOUNTER — Ambulatory Visit (INDEPENDENT_AMBULATORY_CARE_PROVIDER_SITE_OTHER): Payer: Medicare Other | Admitting: *Deleted

## 2013-02-07 ENCOUNTER — Encounter: Payer: Self-pay | Admitting: Internal Medicine

## 2013-02-07 DIAGNOSIS — I442 Atrioventricular block, complete: Secondary | ICD-10-CM | POA: Diagnosis not present

## 2013-02-07 LAB — PACEMAKER DEVICE OBSERVATION
AL AMPLITUDE: 2 mv
AL IMPEDENCE PM: 563 Ohm
AL THRESHOLD: 0.5 V
ATRIAL PACING PM: 8
BAMS-0001: 150 {beats}/min
RV LEAD IMPEDENCE PM: 687 Ohm
RV LEAD THRESHOLD: 0.5 V

## 2013-02-07 NOTE — Progress Notes (Signed)
Pacemaker check in clinic. Normal device function. Thresholds, sensing, impedances consistent with previous measurements. Device programmed to maximize longevity. 135 mode switches (0.4%)---89 AHR episodes, Max Duration of 1 hr , Max A 186, Max V 145, Max Avg V 110---PACs. 2 NSVT episodes--max V 226, max dur.of 21 bts---last episode 4-23. Device programmed at appropriate safety margins. Histogram distribution appropriate for patient activity level. Device programmed to optimize intrinsic conduction. Estimated longevity 2.5 years. Patient will follow up with GT in 3 months.

## 2013-02-08 DIAGNOSIS — Z0000 Encounter for general adult medical examination without abnormal findings: Secondary | ICD-10-CM | POA: Diagnosis not present

## 2013-02-08 DIAGNOSIS — L270 Generalized skin eruption due to drugs and medicaments taken internally: Secondary | ICD-10-CM | POA: Diagnosis not present

## 2013-02-08 DIAGNOSIS — I10 Essential (primary) hypertension: Secondary | ICD-10-CM | POA: Diagnosis not present

## 2013-02-08 DIAGNOSIS — Z1331 Encounter for screening for depression: Secondary | ICD-10-CM | POA: Diagnosis not present

## 2013-02-08 DIAGNOSIS — R21 Rash and other nonspecific skin eruption: Secondary | ICD-10-CM | POA: Diagnosis not present

## 2013-03-08 DIAGNOSIS — M5430 Sciatica, unspecified side: Secondary | ICD-10-CM | POA: Diagnosis not present

## 2013-03-08 DIAGNOSIS — R21 Rash and other nonspecific skin eruption: Secondary | ICD-10-CM | POA: Diagnosis not present

## 2013-03-08 DIAGNOSIS — I10 Essential (primary) hypertension: Secondary | ICD-10-CM | POA: Diagnosis not present

## 2013-03-19 DIAGNOSIS — H3530 Unspecified macular degeneration: Secondary | ICD-10-CM | POA: Diagnosis not present

## 2013-03-19 DIAGNOSIS — Z961 Presence of intraocular lens: Secondary | ICD-10-CM | POA: Diagnosis not present

## 2013-03-26 ENCOUNTER — Other Ambulatory Visit: Payer: Self-pay | Admitting: Internal Medicine

## 2013-03-26 ENCOUNTER — Ambulatory Visit
Admission: RE | Admit: 2013-03-26 | Discharge: 2013-03-26 | Disposition: A | Discharge status: Home / Self Care | Payer: Medicare Other | Source: Ambulatory Visit | Attending: Internal Medicine | Admitting: Internal Medicine

## 2013-03-26 DIAGNOSIS — M25559 Pain in unspecified hip: Secondary | ICD-10-CM | POA: Diagnosis not present

## 2013-03-26 DIAGNOSIS — M5430 Sciatica, unspecified side: Secondary | ICD-10-CM | POA: Diagnosis not present

## 2013-03-26 DIAGNOSIS — M76899 Other specified enthesopathies of unspecified lower limb, excluding foot: Secondary | ICD-10-CM | POA: Diagnosis not present

## 2013-03-26 DIAGNOSIS — M25552 Pain in left hip: Secondary | ICD-10-CM

## 2013-05-16 ENCOUNTER — Encounter: Payer: Self-pay | Admitting: *Deleted

## 2013-05-16 DIAGNOSIS — R21 Rash and other nonspecific skin eruption: Secondary | ICD-10-CM | POA: Diagnosis not present

## 2013-05-16 DIAGNOSIS — Z85828 Personal history of other malignant neoplasm of skin: Secondary | ICD-10-CM | POA: Diagnosis not present

## 2013-05-18 ENCOUNTER — Encounter: Payer: Self-pay | Admitting: Internal Medicine

## 2013-05-18 ENCOUNTER — Ambulatory Visit (INDEPENDENT_AMBULATORY_CARE_PROVIDER_SITE_OTHER): Payer: Medicare Other | Admitting: Internal Medicine

## 2013-05-18 VITALS — BP 162/96 | HR 87 | Ht 65.0 in | Wt 127.0 lb

## 2013-05-18 DIAGNOSIS — Z950 Presence of cardiac pacemaker: Secondary | ICD-10-CM

## 2013-05-18 DIAGNOSIS — I442 Atrioventricular block, complete: Secondary | ICD-10-CM

## 2013-05-18 LAB — MDC_IDC_ENUM_SESS_TYPE_INCLINIC
Battery Impedance: 1639 Ohm
Battery Remaining Longevity: 31 mo
Battery Voltage: 2.74 V
Brady Statistic AP VP Percent: 11 %
Brady Statistic AP VS Percent: 0 %
Brady Statistic AS VP Percent: 88 %
Brady Statistic AS VS Percent: 0 %
Date Time Interrogation Session: 20150123101807
Lead Channel Impedance Value: 464 Ohm
Lead Channel Impedance Value: 704 Ohm
Lead Channel Pacing Threshold Amplitude: 0.5 V
Lead Channel Pacing Threshold Amplitude: 0.75 V
Lead Channel Pacing Threshold Pulse Width: 0.4 ms
Lead Channel Pacing Threshold Pulse Width: 0.4 ms
Lead Channel Setting Pacing Amplitude: 1.5 V
Lead Channel Setting Pacing Amplitude: 2 V
Lead Channel Setting Pacing Pulse Width: 0.4 ms
Lead Channel Setting Sensing Sensitivity: 2 mV
MDC IDC MSMT LEADCHNL RA SENSING INTR AMPL: 2 mV

## 2013-05-18 NOTE — Patient Instructions (Signed)
Your physician recommends that you continue on your current medications as directed. Please refer to the Current Medication list given to you today.  Remote monitoring is used to monitor your Pacemaker of ICD from home. This monitoring reduces the number of office visits required to check your device to one time per year. It allows [place] to keep an eye on the functioning of your device to ensure it is working properly. You are scheduled for a device check from home on 08/20/2013. You may send your transmission at any time that day. If you have a wireless device, the transmission will be sent automatically. After your physician reviews your transmission, you will receive a postcard with your next transmission date.  Your physician wants you to follow-up in: 1 year with Dr. Lovena Le.  You will receive a reminder letter in the mail two months in advance. If you don't receive a letter, please call our office to schedule the follow-up appointment.

## 2013-05-19 ENCOUNTER — Encounter: Payer: Self-pay | Admitting: Internal Medicine

## 2013-05-19 DIAGNOSIS — I442 Atrioventricular block, complete: Secondary | ICD-10-CM | POA: Insufficient documentation

## 2013-05-19 DIAGNOSIS — Z950 Presence of cardiac pacemaker: Secondary | ICD-10-CM | POA: Insufficient documentation

## 2013-05-19 NOTE — Assessment & Plan Note (Signed)
She is asymptomatic after PPM insertion.

## 2013-05-19 NOTE — Progress Notes (Signed)
HPI [name] is referred today for ongoing evaluation and management of her PPM. She is a pleasant 80 yo woman, with a h/o CHB, s/p PPM insertion and HTN. The patient denies anginal symptoms. No sob or syncope. Minimal peripheral edema. Allergies  Allergen Reactions  . Antihistamines, Chlorpheniramine-Type     nightmares  . Ativan [Lorazepam]     Possible: agitation  . Imiquimod   . Micardis Hct [Telmisartan-Hctz]     fatigue  . Thiazide-Type Diuretics     Possible photosensitivity  . Ultracet [Tramadol-Acetaminophen]     nightmares     Current Outpatient Prescriptions  Medication Sig Dispense Refill  . amLODipine (NORVASC) 5 MG tablet Take 2.5 mg by mouth daily.      Marland Kitchen aspirin 81 MG tablet Take 81 mg by mouth daily.      . calcium carbonate (OS-CAL) 600 MG TABS tablet Take 600 mg by mouth daily.      . Camphor-Menthol (TIGER BALM REGULAR STRENGTH) 11-8 % OINT Apply topically as directed.      . Cholecalciferol (VITAMIN D3) 2000 UNITS TABS Take by mouth daily.      . Cyanocobalamin (VITAMIN B-12 CR PO) Take by mouth daily.      . fish oil-omega-3 fatty acids 1000 MG capsule Take 2 g by mouth 2 (two) times daily.      . Multiple Vitamin (MULTIVITAMIN) tablet Take 1 tablet by mouth daily.      Marland Kitchen triamterene-hydrochlorothiazide (MAXZIDE-25) 37.5-25 MG per tablet Take 0.5 tablets by mouth daily.       No current facility-administered medications for this visit.     Past Medical History  Diagnosis Date  . Hypertension   . Facial rash   . Seborrheic keratoses   . Microscopic hematuria     with negative workup   . Fibrocystic disease of breast   . Anxiety   . Compression fracture of L1 lumbar vertebra   . Eustachian tube dysfunction   . Allergic rhinitis   . Rhomboid muscle pain     spasm  . Depression     situational  . Urinary frequency     with nocturia  . Headache   . UTI (lower urinary tract infection)   . Tendinopathy of rotator cuff   . Arthritis      Left AC joint    ROS:   All systems reviewed and negative except as noted in the HPI.   Past Surgical History  Procedure Laterality Date  . Insert / replace / remove pacemaker       Family History  Problem Relation Age of Onset  . Diabetes Mother   . Stroke Mother   . Coronary artery disease Mother   . Cancer - Lung Father   . Hypertension Sister   . Hypertension Brother   . Diabetes Brother   . Cancer Brother   . Hypertension Brother   . Diabetes Brother   . Cancer Brother      History   Social History  . Marital Status: Widowed    Spouse Name: [name]    Number of Children: [name]  . Years of Education: [name]   Occupational History  . Not on file.   Social History Main Topics  . Smoking status: Never Smoker   . Smokeless tobacco: Not on file  . Alcohol Use: No  . Drug Use: No  . Sexual Activity: Not on file   Other Topics Concern  . Not on  file   Social History Narrative  . No narrative on file     BP 162/96  Pulse 87  Ht 5\' 5"  (1.651 m)  Wt 127 lb (57.607 kg)  BMI 21.13 kg/m2  Physical Exam:  Well appearing elderly woman, NAD HEENT: Unremarkable Neck:  No JVD, no thyromegally Back:  No CVA tenderness Lungs:  Clear with no wheezes, rales, or rhonchi HEART:  Regular rate rhythm, no murmurs, no rubs, no clicks Abd:  soft, positive bowel sounds, no organomegally, no rebound, no guarding Ext:  2 plus pulses, no edema, no cyanosis, no clubbing Skin:  No rashes no nodules Neuro:  CN II through XII intact, motor grossly intact  EKG  DEVICE  Normal device function.  See PaceArt for details.   Assess/Plan:

## 2013-05-19 NOTE — Assessment & Plan Note (Signed)
Her Medtronic DDD PM is working normally. Will recheck in several months. She has approximately 2.5 years of battery longevity.

## 2013-05-23 DIAGNOSIS — R300 Dysuria: Secondary | ICD-10-CM | POA: Diagnosis not present

## 2013-05-23 DIAGNOSIS — M1710 Unilateral primary osteoarthritis, unspecified knee: Secondary | ICD-10-CM | POA: Diagnosis not present

## 2013-05-23 DIAGNOSIS — IMO0002 Reserved for concepts with insufficient information to code with codable children: Secondary | ICD-10-CM | POA: Diagnosis not present

## 2013-05-23 DIAGNOSIS — R269 Unspecified abnormalities of gait and mobility: Secondary | ICD-10-CM | POA: Diagnosis not present

## 2013-05-23 DIAGNOSIS — I10 Essential (primary) hypertension: Secondary | ICD-10-CM | POA: Diagnosis not present

## 2013-06-01 DIAGNOSIS — F3289 Other specified depressive episodes: Secondary | ICD-10-CM | POA: Diagnosis not present

## 2013-06-01 DIAGNOSIS — S9030XA Contusion of unspecified foot, initial encounter: Secondary | ICD-10-CM | POA: Diagnosis not present

## 2013-06-01 DIAGNOSIS — F329 Major depressive disorder, single episode, unspecified: Secondary | ICD-10-CM | POA: Diagnosis not present

## 2013-06-01 DIAGNOSIS — W19XXXA Unspecified fall, initial encounter: Secondary | ICD-10-CM | POA: Diagnosis not present

## 2013-06-11 DIAGNOSIS — IMO0001 Reserved for inherently not codable concepts without codable children: Secondary | ICD-10-CM | POA: Diagnosis not present

## 2013-06-11 DIAGNOSIS — M1710 Unilateral primary osteoarthritis, unspecified knee: Secondary | ICD-10-CM | POA: Diagnosis not present

## 2013-06-11 DIAGNOSIS — R269 Unspecified abnormalities of gait and mobility: Secondary | ICD-10-CM | POA: Diagnosis not present

## 2013-06-11 DIAGNOSIS — W19XXXA Unspecified fall, initial encounter: Secondary | ICD-10-CM | POA: Diagnosis not present

## 2013-06-20 DIAGNOSIS — W19XXXA Unspecified fall, initial encounter: Secondary | ICD-10-CM | POA: Diagnosis not present

## 2013-06-20 DIAGNOSIS — IMO0001 Reserved for inherently not codable concepts without codable children: Secondary | ICD-10-CM | POA: Diagnosis not present

## 2013-06-20 DIAGNOSIS — M1710 Unilateral primary osteoarthritis, unspecified knee: Secondary | ICD-10-CM | POA: Diagnosis not present

## 2013-06-20 DIAGNOSIS — R269 Unspecified abnormalities of gait and mobility: Secondary | ICD-10-CM | POA: Diagnosis not present

## 2013-06-26 DIAGNOSIS — R269 Unspecified abnormalities of gait and mobility: Secondary | ICD-10-CM | POA: Diagnosis not present

## 2013-06-26 DIAGNOSIS — IMO0001 Reserved for inherently not codable concepts without codable children: Secondary | ICD-10-CM | POA: Diagnosis not present

## 2013-06-26 DIAGNOSIS — M1710 Unilateral primary osteoarthritis, unspecified knee: Secondary | ICD-10-CM | POA: Diagnosis not present

## 2013-06-26 DIAGNOSIS — W19XXXA Unspecified fall, initial encounter: Secondary | ICD-10-CM | POA: Diagnosis not present

## 2013-06-26 DIAGNOSIS — F3289 Other specified depressive episodes: Secondary | ICD-10-CM | POA: Diagnosis not present

## 2013-06-26 DIAGNOSIS — F329 Major depressive disorder, single episode, unspecified: Secondary | ICD-10-CM | POA: Diagnosis not present

## 2013-06-26 DIAGNOSIS — I10 Essential (primary) hypertension: Secondary | ICD-10-CM | POA: Diagnosis not present

## 2013-06-26 DIAGNOSIS — IMO0002 Reserved for concepts with insufficient information to code with codable children: Secondary | ICD-10-CM | POA: Diagnosis not present

## 2013-06-28 DIAGNOSIS — M1710 Unilateral primary osteoarthritis, unspecified knee: Secondary | ICD-10-CM | POA: Diagnosis not present

## 2013-06-28 DIAGNOSIS — IMO0001 Reserved for inherently not codable concepts without codable children: Secondary | ICD-10-CM | POA: Diagnosis not present

## 2013-06-28 DIAGNOSIS — W19XXXA Unspecified fall, initial encounter: Secondary | ICD-10-CM | POA: Diagnosis not present

## 2013-06-28 DIAGNOSIS — R269 Unspecified abnormalities of gait and mobility: Secondary | ICD-10-CM | POA: Diagnosis not present

## 2013-07-03 DIAGNOSIS — IMO0001 Reserved for inherently not codable concepts without codable children: Secondary | ICD-10-CM | POA: Diagnosis not present

## 2013-07-03 DIAGNOSIS — M1710 Unilateral primary osteoarthritis, unspecified knee: Secondary | ICD-10-CM | POA: Diagnosis not present

## 2013-07-03 DIAGNOSIS — R269 Unspecified abnormalities of gait and mobility: Secondary | ICD-10-CM | POA: Diagnosis not present

## 2013-07-03 DIAGNOSIS — W19XXXA Unspecified fall, initial encounter: Secondary | ICD-10-CM | POA: Diagnosis not present

## 2013-07-05 DIAGNOSIS — R269 Unspecified abnormalities of gait and mobility: Secondary | ICD-10-CM | POA: Diagnosis not present

## 2013-07-05 DIAGNOSIS — W19XXXA Unspecified fall, initial encounter: Secondary | ICD-10-CM | POA: Diagnosis not present

## 2013-07-05 DIAGNOSIS — IMO0001 Reserved for inherently not codable concepts without codable children: Secondary | ICD-10-CM | POA: Diagnosis not present

## 2013-07-05 DIAGNOSIS — M1710 Unilateral primary osteoarthritis, unspecified knee: Secondary | ICD-10-CM | POA: Diagnosis not present

## 2013-07-10 DIAGNOSIS — IMO0001 Reserved for inherently not codable concepts without codable children: Secondary | ICD-10-CM | POA: Diagnosis not present

## 2013-07-10 DIAGNOSIS — M1710 Unilateral primary osteoarthritis, unspecified knee: Secondary | ICD-10-CM | POA: Diagnosis not present

## 2013-07-10 DIAGNOSIS — W19XXXA Unspecified fall, initial encounter: Secondary | ICD-10-CM | POA: Diagnosis not present

## 2013-07-10 DIAGNOSIS — R269 Unspecified abnormalities of gait and mobility: Secondary | ICD-10-CM | POA: Diagnosis not present

## 2013-07-12 DIAGNOSIS — M1710 Unilateral primary osteoarthritis, unspecified knee: Secondary | ICD-10-CM | POA: Diagnosis not present

## 2013-07-12 DIAGNOSIS — W19XXXA Unspecified fall, initial encounter: Secondary | ICD-10-CM | POA: Diagnosis not present

## 2013-07-12 DIAGNOSIS — R269 Unspecified abnormalities of gait and mobility: Secondary | ICD-10-CM | POA: Diagnosis not present

## 2013-07-12 DIAGNOSIS — IMO0001 Reserved for inherently not codable concepts without codable children: Secondary | ICD-10-CM | POA: Diagnosis not present

## 2013-07-17 DIAGNOSIS — IMO0001 Reserved for inherently not codable concepts without codable children: Secondary | ICD-10-CM | POA: Diagnosis not present

## 2013-07-17 DIAGNOSIS — R269 Unspecified abnormalities of gait and mobility: Secondary | ICD-10-CM | POA: Diagnosis not present

## 2013-07-17 DIAGNOSIS — W19XXXA Unspecified fall, initial encounter: Secondary | ICD-10-CM | POA: Diagnosis not present

## 2013-07-17 DIAGNOSIS — M1710 Unilateral primary osteoarthritis, unspecified knee: Secondary | ICD-10-CM | POA: Diagnosis not present

## 2013-07-19 DIAGNOSIS — IMO0001 Reserved for inherently not codable concepts without codable children: Secondary | ICD-10-CM | POA: Diagnosis not present

## 2013-07-19 DIAGNOSIS — R269 Unspecified abnormalities of gait and mobility: Secondary | ICD-10-CM | POA: Diagnosis not present

## 2013-07-19 DIAGNOSIS — M1710 Unilateral primary osteoarthritis, unspecified knee: Secondary | ICD-10-CM | POA: Diagnosis not present

## 2013-07-19 DIAGNOSIS — W19XXXA Unspecified fall, initial encounter: Secondary | ICD-10-CM | POA: Diagnosis not present

## 2013-08-20 ENCOUNTER — Encounter: Payer: Self-pay | Admitting: Internal Medicine

## 2013-08-20 ENCOUNTER — Ambulatory Visit (INDEPENDENT_AMBULATORY_CARE_PROVIDER_SITE_OTHER): Payer: Medicare Other | Admitting: *Deleted

## 2013-08-20 DIAGNOSIS — I442 Atrioventricular block, complete: Secondary | ICD-10-CM | POA: Diagnosis not present

## 2013-08-20 LAB — MDC_IDC_ENUM_SESS_TYPE_REMOTE
Battery Impedance: 1881 Ohm
Battery Remaining Longevity: 26 mo
Battery Voltage: 2.75 V
Brady Statistic AP VP Percent: 7 %
Brady Statistic AP VS Percent: 0 %
Brady Statistic AS VP Percent: 92 %
Brady Statistic AS VS Percent: 1 %
Date Time Interrogation Session: 20150427203437
Lead Channel Impedance Value: 672 Ohm
Lead Channel Pacing Threshold Amplitude: 0.5 V
Lead Channel Pacing Threshold Amplitude: 0.75 V
Lead Channel Pacing Threshold Pulse Width: 0.4 ms
Lead Channel Pacing Threshold Pulse Width: 0.4 ms
Lead Channel Sensing Intrinsic Amplitude: 2.8 mV
Lead Channel Setting Pacing Amplitude: 1.5 V
Lead Channel Setting Pacing Pulse Width: 0.4 ms
Lead Channel Setting Sensing Sensitivity: 2 mV
MDC IDC MSMT LEADCHNL RA IMPEDANCE VALUE: 476 Ohm
MDC IDC SET LEADCHNL RV PACING AMPLITUDE: 2 V

## 2013-09-06 ENCOUNTER — Encounter: Payer: Self-pay | Admitting: Cardiology

## 2013-09-06 DIAGNOSIS — IMO0002 Reserved for concepts with insufficient information to code with codable children: Secondary | ICD-10-CM | POA: Diagnosis not present

## 2013-09-06 DIAGNOSIS — I10 Essential (primary) hypertension: Secondary | ICD-10-CM | POA: Diagnosis not present

## 2013-09-06 DIAGNOSIS — R269 Unspecified abnormalities of gait and mobility: Secondary | ICD-10-CM | POA: Diagnosis not present

## 2013-09-06 DIAGNOSIS — F3289 Other specified depressive episodes: Secondary | ICD-10-CM | POA: Diagnosis not present

## 2013-09-06 DIAGNOSIS — W19XXXA Unspecified fall, initial encounter: Secondary | ICD-10-CM | POA: Diagnosis not present

## 2013-09-06 DIAGNOSIS — R233 Spontaneous ecchymoses: Secondary | ICD-10-CM | POA: Diagnosis not present

## 2013-09-06 DIAGNOSIS — M1710 Unilateral primary osteoarthritis, unspecified knee: Secondary | ICD-10-CM | POA: Diagnosis not present

## 2013-09-06 DIAGNOSIS — F329 Major depressive disorder, single episode, unspecified: Secondary | ICD-10-CM | POA: Diagnosis not present

## 2013-10-11 ENCOUNTER — Encounter: Payer: Self-pay | Admitting: Physician Assistant

## 2013-10-11 DIAGNOSIS — E860 Dehydration: Secondary | ICD-10-CM | POA: Diagnosis not present

## 2013-10-17 DIAGNOSIS — M549 Dorsalgia, unspecified: Secondary | ICD-10-CM | POA: Diagnosis not present

## 2013-10-19 ENCOUNTER — Encounter: Payer: Self-pay | Admitting: Interventional Cardiology

## 2013-10-21 DIAGNOSIS — IMO0002 Reserved for concepts with insufficient information to code with codable children: Secondary | ICD-10-CM | POA: Diagnosis not present

## 2013-11-16 ENCOUNTER — Ambulatory Visit: Payer: Medicare Other | Admitting: Interventional Cardiology

## 2013-11-21 ENCOUNTER — Ambulatory Visit (INDEPENDENT_AMBULATORY_CARE_PROVIDER_SITE_OTHER): Payer: Medicare Other | Admitting: Physician Assistant

## 2013-11-21 ENCOUNTER — Encounter: Payer: Self-pay | Admitting: Physician Assistant

## 2013-11-21 VITALS — BP 150/70 | HR 75 | Ht 65.0 in | Wt 129.0 lb

## 2013-11-21 DIAGNOSIS — E785 Hyperlipidemia, unspecified: Secondary | ICD-10-CM

## 2013-11-21 DIAGNOSIS — I10 Essential (primary) hypertension: Secondary | ICD-10-CM | POA: Insufficient documentation

## 2013-11-21 DIAGNOSIS — Z950 Presence of cardiac pacemaker: Secondary | ICD-10-CM

## 2013-11-21 MED ORDER — AMLODIPINE BESYLATE 10 MG PO TABS: 10.0000 mg | ORAL_TABLET | Freq: Every day | ORAL | Status: DC

## 2013-11-21 NOTE — Patient Instructions (Signed)
INCREASE NORVASC TO 10 MG DAILY; NEW RX SENT IN TODAY  YOU WILL NEED A BLOOD PRESSURE CHECK WITH THE NURSE IN 3-4 WEEKS; MAKE SURE TO BRING YOUR BP MACHINE THAT DAY AS WELL  Your physician wants you to follow-up in: [place] DR. VARANASI. You will receive a reminder letter in the mail two months in advance. If you don't receive a letter, please call our office to schedule the follow-up appointment.

## 2013-11-21 NOTE — Progress Notes (Signed)
Cardiology Office Note    Date:  11/21/2013   ID:  [name], DOB 11-01-1924, MRN [id number]  PCP:  [name], MD  Cardiologist:  Dr. Casandra Doffing   Electrophysiologist:  Dr. Cristopher Peru    History of Present Illness: [name] is a 80 y.o. female with a hx of no significant CAD at cath in 2006, prior CVA (residual L leg weakness), HTN, HL, CHB s/p PPM.  Last seen by Dr. Casandra Doffing in 7.2014.  Returns for f/u.  The patient denies chest pain, shortness of breath, syncope, orthopnea, PND.  She has some mild edema without change. She has mild assoc stasis changes.  BP stays high at home.   Studies:  - LHC (04/2004):  Dx 30%, EF 65%  - Echo (04/2004):  EF 70%, mild diast dysfn, mild Ao sclerosis  - Nuclear (03/2004):  Breast atten, apical gut artifact vs ischemia, EF 76%; low risk   Recent Labs: No results found for requested labs within last 365 days. 10/11/13:  K 4.3, Cr 0.88 09/06/13:  Hgb 14.6, TSH 2.38   Wt Readings from Last 3 Encounters:  11/21/13 129 lb (58.514 kg)  05/18/13 127 lb (57.607 kg)     Past Medical History  Diagnosis Date  . Hypertension   . Facial rash   . Seborrheic keratoses   . Microscopic hematuria     with negative workup   . Fibrocystic disease of breast   . Anxiety   . Compression fracture of L1 lumbar vertebra   . Eustachian tube dysfunction   . Allergic rhinitis   . Rhomboid muscle pain     spasm  . Depression     situational  . Urinary frequency     with nocturia  . Headache   . UTI (lower urinary tract infection)   . Tendinopathy of rotator cuff   . Arthritis     Left AC joint    Current Outpatient Prescriptions  Medication Sig Dispense Refill  . amLODipine (NORVASC) 5 MG tablet Take 2.5 mg by mouth daily.      Marland Kitchen aspirin 81 MG tablet Take 81 mg by mouth daily.      . calcium carbonate (OS-CAL) 600 MG TABS tablet Take 600 mg by mouth daily.      . Camphor-Menthol (TIGER BALM REGULAR STRENGTH) 11-8  % OINT Apply topically as directed.      . Cholecalciferol (VITAMIN D3) 2000 UNITS TABS Take by mouth daily.      . Cyanocobalamin (VITAMIN B-12 CR PO) Take by mouth daily.      . fish oil-omega-3 fatty acids 1000 MG capsule Take 2 g by mouth 2 (two) times daily.      . Multiple Vitamin (MULTIVITAMIN) tablet Take 1 tablet by mouth daily.      Marland Kitchen triamterene-hydrochlorothiazide (MAXZIDE-25) 37.5-25 MG per tablet Take 0.5 tablets by mouth daily.       No current facility-administered medications for this visit.    Allergies:   Antihistamines, chlorpheniramine-type; Ativan; Imiquimod; Micardis hct; Thiazide-type diuretics; and Ultracet   Social History:  The patient  reports that she has never smoked. She does not have any smokeless tobacco history on file. She reports that she does not drink alcohol or use illicit drugs.   Family History:  The patient's family history includes Cancer in her brother and brother; Cancer - Lung in her father; Coronary artery disease in her mother; Diabetes in her brother, brother, and mother; Hypertension in  her brother, brother, and sister; Stroke in her mother.   ROS:  Please see the history of present illness.      All other systems reviewed and negative.   PHYSICAL EXAM: VS:  BP 150/70  Pulse 75  Ht 5\' 5"  (1.651 m)  Wt 129 lb (58.514 kg)  BMI 21.47 kg/m2 Well nourished, well developed, in no acute distress HEENT: normal Neck: no JVD Vascular:  No carotid bruit Cardiac:  normal S1, S2; RRR; no murmur Lungs:  clear to auscultation bilaterally, no wheezing, rhonchi or rales Abd: soft, nontender, no hepatomegaly Ext: trace bilateral LE  [place] stasis changes Skin: warm and dry Neuro:  CNs 2-12 intact, no focal abnormalities noted  EKG:  NSR, Vpaced, HR 75     ASSESSMENT AND PLAN:  Unspecified essential hypertension :  BP uncontrolled.  Increase Norvasc from 5 to 10 mg QD.  Check BP with RN in 3-4 weeks with her machine.    HLD (hyperlipidemia):   Managed by PCP.  Pacemaker:  F/u with EP as planned.    Disposition:  F/u with RN in 3-4 weeks for BP check with her BP machine.  F/u with Dr. Casandra Doffing in 6 mos.    Signed, Versie Starks, MHS 11/21/2013 3:52 PM    [facility] [place], [place], [place]  [address] Phone: [phone]; Fax: [phone]

## 2013-11-22 ENCOUNTER — Encounter: Payer: Medicare Other | Admitting: *Deleted

## 2013-11-28 ENCOUNTER — Encounter: Payer: Self-pay | Admitting: *Deleted

## 2013-11-28 ENCOUNTER — Encounter: Payer: Self-pay | Admitting: Interventional Cardiology

## 2013-11-29 ENCOUNTER — Encounter: Payer: Self-pay | Admitting: Cardiology

## 2013-12-04 ENCOUNTER — Other Ambulatory Visit: Payer: Self-pay

## 2013-12-04 DIAGNOSIS — I10 Essential (primary) hypertension: Secondary | ICD-10-CM

## 2013-12-04 MED ORDER — AMLODIPINE BESYLATE 10 MG PO TABS: 10.0000 mg | ORAL_TABLET | Freq: Every day | ORAL | Status: DC

## 2013-12-07 ENCOUNTER — Ambulatory Visit (INDEPENDENT_AMBULATORY_CARE_PROVIDER_SITE_OTHER): Payer: Medicare Other | Admitting: *Deleted

## 2013-12-07 DIAGNOSIS — I442 Atrioventricular block, complete: Secondary | ICD-10-CM

## 2013-12-10 NOTE — Progress Notes (Signed)
Remote pacemaker transmission.   

## 2013-12-11 LAB — MDC_IDC_ENUM_SESS_TYPE_REMOTE
Brady Statistic AP VS Percent: 0.1 %
Brady Statistic AS VP Percent: 89.1 %
Brady Statistic AS VS Percent: 0.6 %
Lead Channel Impedance Value: 502 Ohm
Lead Channel Pacing Threshold Amplitude: 0.75 V
Lead Channel Pacing Threshold Pulse Width: 0.4 ms
Lead Channel Pacing Threshold Pulse Width: 0.4 ms
Lead Channel Setting Pacing Amplitude: 2 V
Lead Channel Setting Pacing Pulse Width: 0.4 ms
MDC IDC MSMT LEADCHNL RA SENSING INTR AMPL: 2.8 mV
MDC IDC MSMT LEADCHNL RV IMPEDANCE VALUE: 687 Ohm
MDC IDC MSMT LEADCHNL RV PACING THRESHOLD AMPLITUDE: 0.375 V
MDC IDC SET LEADCHNL RA PACING AMPLITUDE: 1.5 V
MDC IDC SET LEADCHNL RV SENSING SENSITIVITY: 2 mV
MDC IDC STAT BRADY AP VP PERCENT: 10.3 %

## 2013-12-20 DIAGNOSIS — F329 Major depressive disorder, single episode, unspecified: Secondary | ICD-10-CM | POA: Diagnosis not present

## 2013-12-20 DIAGNOSIS — I10 Essential (primary) hypertension: Secondary | ICD-10-CM | POA: Diagnosis not present

## 2013-12-20 DIAGNOSIS — I2510 Atherosclerotic heart disease of native coronary artery without angina pectoris: Secondary | ICD-10-CM | POA: Diagnosis not present

## 2013-12-20 DIAGNOSIS — T148XXA Other injury of unspecified body region, initial encounter: Secondary | ICD-10-CM | POA: Diagnosis not present

## 2013-12-20 DIAGNOSIS — Z23 Encounter for immunization: Secondary | ICD-10-CM | POA: Diagnosis not present

## 2013-12-20 DIAGNOSIS — F3289 Other specified depressive episodes: Secondary | ICD-10-CM | POA: Diagnosis not present

## 2014-02-10 DIAGNOSIS — T798XXA Other early complications of trauma, initial encounter: Secondary | ICD-10-CM | POA: Diagnosis not present

## 2014-02-10 DIAGNOSIS — L03116 Cellulitis of left lower limb: Secondary | ICD-10-CM | POA: Diagnosis not present

## 2014-02-11 DIAGNOSIS — T148 Other injury of unspecified body region: Secondary | ICD-10-CM | POA: Diagnosis not present

## 2014-02-18 DIAGNOSIS — Z23 Encounter for immunization: Secondary | ICD-10-CM | POA: Diagnosis not present

## 2014-02-18 DIAGNOSIS — T148 Other injury of unspecified body region: Secondary | ICD-10-CM | POA: Diagnosis not present

## 2014-02-23 DIAGNOSIS — R21 Rash and other nonspecific skin eruption: Secondary | ICD-10-CM | POA: Diagnosis not present

## 2014-03-12 ENCOUNTER — Encounter: Payer: Self-pay | Admitting: Internal Medicine

## 2014-03-13 ENCOUNTER — Ambulatory Visit (INDEPENDENT_AMBULATORY_CARE_PROVIDER_SITE_OTHER): Payer: Medicare Other | Admitting: *Deleted

## 2014-03-13 DIAGNOSIS — I442 Atrioventricular block, complete: Secondary | ICD-10-CM

## 2014-03-13 LAB — MDC_IDC_ENUM_SESS_TYPE_REMOTE
Battery Impedance: 2122 Ohm
Battery Remaining Longevity: 24 mo
Battery Voltage: 2.74 V
Brady Statistic AP VP Percent: 10 %
Brady Statistic AS VP Percent: 89 %
Brady Statistic AS VS Percent: 0 %
Date Time Interrogation Session: 20151117223106
Lead Channel Impedance Value: 517 Ohm
Lead Channel Pacing Threshold Pulse Width: 0.4 ms
Lead Channel Pacing Threshold Pulse Width: 0.4 ms
Lead Channel Sensing Intrinsic Amplitude: 0.7 mV
Lead Channel Setting Pacing Amplitude: 1.5 V
Lead Channel Setting Pacing Amplitude: 2 V
Lead Channel Setting Pacing Pulse Width: 0.4 ms
Lead Channel Setting Sensing Sensitivity: 2 mV
MDC IDC MSMT LEADCHNL RA PACING THRESHOLD AMPLITUDE: 0.75 V
MDC IDC MSMT LEADCHNL RV IMPEDANCE VALUE: 697 Ohm
MDC IDC MSMT LEADCHNL RV PACING THRESHOLD AMPLITUDE: 0.375 V
MDC IDC STAT BRADY AP VS PERCENT: 0 %

## 2014-03-13 NOTE — Progress Notes (Signed)
Remote pacemaker transmission.   

## 2014-03-14 DIAGNOSIS — R3915 Urgency of urination: Secondary | ICD-10-CM | POA: Diagnosis not present

## 2014-03-14 DIAGNOSIS — E782 Mixed hyperlipidemia: Secondary | ICD-10-CM | POA: Diagnosis not present

## 2014-03-14 DIAGNOSIS — F419 Anxiety disorder, unspecified: Secondary | ICD-10-CM | POA: Diagnosis not present

## 2014-03-14 DIAGNOSIS — Z0001 Encounter for general adult medical examination with abnormal findings: Secondary | ICD-10-CM | POA: Diagnosis not present

## 2014-03-14 DIAGNOSIS — I4510 Unspecified right bundle-branch block: Secondary | ICD-10-CM | POA: Diagnosis not present

## 2014-03-14 DIAGNOSIS — M549 Dorsalgia, unspecified: Secondary | ICD-10-CM | POA: Diagnosis not present

## 2014-03-14 DIAGNOSIS — I10 Essential (primary) hypertension: Secondary | ICD-10-CM | POA: Diagnosis not present

## 2014-03-14 DIAGNOSIS — I2510 Atherosclerotic heart disease of native coronary artery without angina pectoris: Secondary | ICD-10-CM | POA: Diagnosis not present

## 2014-03-14 DIAGNOSIS — Z1389 Encounter for screening for other disorder: Secondary | ICD-10-CM | POA: Diagnosis not present

## 2014-03-14 DIAGNOSIS — E559 Vitamin D deficiency, unspecified: Secondary | ICD-10-CM | POA: Diagnosis not present

## 2014-03-20 ENCOUNTER — Encounter: Payer: Self-pay | Admitting: Cardiology

## 2014-03-22 ENCOUNTER — Encounter (HOSPITAL_COMMUNITY): Payer: Self-pay | Admitting: *Deleted

## 2014-03-22 ENCOUNTER — Encounter (HOSPITAL_COMMUNITY)
Admission: EM | Disposition: A | Discharge status: Home / Self Care | Payer: Self-pay | Source: Home / Self Care | Attending: Internal Medicine

## 2014-03-22 ENCOUNTER — Inpatient Hospital Stay (HOSPITAL_COMMUNITY)
Admission: EM | Admit: 2014-03-22 | Discharge: 2014-03-25 | DRG: 378 | Disposition: A | Discharge status: Home / Self Care | Payer: Medicare Other | Attending: Internal Medicine | Admitting: Internal Medicine

## 2014-03-22 DIAGNOSIS — F329 Major depressive disorder, single episode, unspecified: Secondary | ICD-10-CM | POA: Diagnosis present

## 2014-03-22 DIAGNOSIS — K449 Diaphragmatic hernia without obstruction or gangrene: Secondary | ICD-10-CM | POA: Diagnosis present

## 2014-03-22 DIAGNOSIS — Z7982 Long term (current) use of aspirin: Secondary | ICD-10-CM

## 2014-03-22 DIAGNOSIS — E876 Hypokalemia: Secondary | ICD-10-CM | POA: Diagnosis present

## 2014-03-22 DIAGNOSIS — K921 Melena: Secondary | ICD-10-CM | POA: Diagnosis not present

## 2014-03-22 DIAGNOSIS — K922 Gastrointestinal hemorrhage, unspecified: Secondary | ICD-10-CM

## 2014-03-22 DIAGNOSIS — R531 Weakness: Secondary | ICD-10-CM | POA: Diagnosis not present

## 2014-03-22 DIAGNOSIS — L821 Other seborrheic keratosis: Secondary | ICD-10-CM | POA: Diagnosis present

## 2014-03-22 DIAGNOSIS — K254 Chronic or unspecified gastric ulcer with hemorrhage: Secondary | ICD-10-CM | POA: Diagnosis present

## 2014-03-22 DIAGNOSIS — R312 Other microscopic hematuria: Secondary | ICD-10-CM | POA: Diagnosis present

## 2014-03-22 DIAGNOSIS — B9681 Helicobacter pylori [H. pylori] as the cause of diseases classified elsewhere: Secondary | ICD-10-CM | POA: Diagnosis present

## 2014-03-22 DIAGNOSIS — Z950 Presence of cardiac pacemaker: Secondary | ICD-10-CM | POA: Diagnosis not present

## 2014-03-22 DIAGNOSIS — Z833 Family history of diabetes mellitus: Secondary | ICD-10-CM

## 2014-03-22 DIAGNOSIS — D62 Acute posthemorrhagic anemia: Secondary | ICD-10-CM | POA: Diagnosis present

## 2014-03-22 DIAGNOSIS — Z23 Encounter for immunization: Secondary | ICD-10-CM

## 2014-03-22 DIAGNOSIS — Z66 Do not resuscitate: Secondary | ICD-10-CM | POA: Diagnosis present

## 2014-03-22 DIAGNOSIS — M19012 Primary osteoarthritis, left shoulder: Secondary | ICD-10-CM | POA: Diagnosis present

## 2014-03-22 DIAGNOSIS — I10 Essential (primary) hypertension: Secondary | ICD-10-CM | POA: Diagnosis present

## 2014-03-22 DIAGNOSIS — Z8249 Family history of ischemic heart disease and other diseases of the circulatory system: Secondary | ICD-10-CM

## 2014-03-22 DIAGNOSIS — Z823 Family history of stroke: Secondary | ICD-10-CM

## 2014-03-22 DIAGNOSIS — Z888 Allergy status to other drugs, medicaments and biological substances status: Secondary | ICD-10-CM | POA: Diagnosis not present

## 2014-03-22 DIAGNOSIS — I2510 Atherosclerotic heart disease of native coronary artery without angina pectoris: Secondary | ICD-10-CM | POA: Diagnosis present

## 2014-03-22 DIAGNOSIS — F419 Anxiety disorder, unspecified: Secondary | ICD-10-CM | POA: Diagnosis present

## 2014-03-22 DIAGNOSIS — K250 Acute gastric ulcer with hemorrhage: Secondary | ICD-10-CM | POA: Diagnosis not present

## 2014-03-22 DIAGNOSIS — I442 Atrioventricular block, complete: Secondary | ICD-10-CM | POA: Diagnosis present

## 2014-03-22 DIAGNOSIS — K920 Hematemesis: Secondary | ICD-10-CM | POA: Diagnosis not present

## 2014-03-22 DIAGNOSIS — D649 Anemia, unspecified: Secondary | ICD-10-CM | POA: Diagnosis not present

## 2014-03-22 HISTORY — DX: Other complications of anesthesia, initial encounter: T88.59XA

## 2014-03-22 HISTORY — PX: ESOPHAGOGASTRODUODENOSCOPY: SHX5428

## 2014-03-22 HISTORY — DX: Adverse effect of unspecified anesthetic, initial encounter: T41.45XA

## 2014-03-22 LAB — COMPREHENSIVE METABOLIC PANEL
ALK PHOS: 40 U/L (ref 39–117)
ALT: 21 U/L (ref 0–35)
ANION GAP: 14 (ref 5–15)
AST: 19 U/L (ref 0–37)
Albumin: 3 g/dL — ABNORMAL LOW (ref 3.5–5.2)
BUN: 77 mg/dL — AB (ref 6–23)
CO2: 25 mEq/L (ref 19–32)
Calcium: 9.8 mg/dL (ref 8.4–10.5)
Chloride: 101 mEq/L (ref 96–112)
Creatinine, Ser: 1.06 mg/dL (ref 0.50–1.10)
GFR calc non Af Amer: 46 mL/min — ABNORMAL LOW (ref >90)
GFR, EST AFRICAN AMERICAN: 53 mL/min — AB (ref >90)
GLUCOSE: 114 mg/dL — AB (ref 70–99)
POTASSIUM: 3.7 meq/L (ref 3.7–5.3)
Sodium: 140 mEq/L (ref 137–147)
Total Bilirubin: 0.4 mg/dL (ref 0.3–1.2)
Total Protein: 5.8 g/dL — ABNORMAL LOW (ref 6.0–8.3)

## 2014-03-22 LAB — MRSA PCR SCREENING: MRSA BY PCR: NEGATIVE

## 2014-03-22 LAB — CBC
HEMATOCRIT: 31.9 % — AB (ref 36.0–46.0)
HEMOGLOBIN: 10.9 g/dL — AB (ref 12.0–15.0)
MCH: 30.3 pg (ref 26.0–34.0)
MCHC: 34.2 g/dL (ref 30.0–36.0)
MCV: 88.6 fL (ref 78.0–100.0)
Platelets: 162 10*3/uL (ref 150–400)
RBC: 3.6 MIL/uL — ABNORMAL LOW (ref 3.87–5.11)
RDW: 14.4 % (ref 11.5–15.5)
WBC: 11.8 10*3/uL — AB (ref 4.0–10.5)

## 2014-03-22 LAB — PROTIME-INR
INR: 1.04 (ref 0.00–1.49)
Prothrombin Time: 13.7 seconds (ref 11.6–15.2)

## 2014-03-22 LAB — ABO/RH: ABO/RH(D): A POS

## 2014-03-22 SURGERY — EGD (ESOPHAGOGASTRODUODENOSCOPY)
Anesthesia: Moderate Sedation | Laterality: Left

## 2014-03-22 MED ORDER — MIDAZOLAM HCL 5 MG/ML IJ SOLN
INTRAMUSCULAR | Status: AC
  Filled 2014-03-22: qty 1

## 2014-03-22 MED ORDER — SODIUM CHLORIDE 0.9 % IV SOLN: INTRAVENOUS | Status: DC

## 2014-03-22 MED ORDER — BUTAMBEN-TETRACAINE-BENZOCAINE 2-2-14 % EX AERO
INHALATION_SPRAY | CUTANEOUS | Status: DC | PRN
  Administered 2014-03-22: 2 via TOPICAL

## 2014-03-22 MED ORDER — MIDAZOLAM HCL 10 MG/2ML IJ SOLN
INTRAMUSCULAR | Status: DC | PRN
  Administered 2014-03-22: 2 mg via INTRAVENOUS

## 2014-03-22 MED ORDER — SODIUM CHLORIDE 0.9 % IV SOLN
80.0000 mg | Freq: Once | INTRAVENOUS | Status: AC
  Administered 2014-03-22: 80 mg via INTRAVENOUS
  Filled 2014-03-22 (×2): qty 80

## 2014-03-22 MED ORDER — SODIUM CHLORIDE 0.9 % IV SOLN
8.0000 mg/h | INTRAVENOUS | Status: DC
  Administered 2014-03-22 – 2014-03-24 (×6): 8 mg/h via INTRAVENOUS
  Filled 2014-03-22 (×12): qty 80

## 2014-03-22 MED ORDER — FENTANYL CITRATE 0.05 MG/ML IJ SOLN
INTRAMUSCULAR | Status: AC
  Filled 2014-03-22: qty 2

## 2014-03-22 MED ORDER — FENTANYL CITRATE 0.05 MG/ML IJ SOLN
INTRAMUSCULAR | Status: DC | PRN
  Administered 2014-03-22: 25 ug via INTRAVENOUS

## 2014-03-22 NOTE — ED Notes (Signed)
Hurt her back while raking leaves a week ago. Prescribed vicodin - developed constipation. Started having bowel movements last evening and now moe frequent. Now, rectal bleeding and x 1 coffee ground emesis. Was di=zzy but no orthhostatic changes. According to family, pt. Pale with dizzy spells.

## 2014-03-22 NOTE — ED Notes (Signed)
Attempted report 

## 2014-03-22 NOTE — H&P (Signed)
[facility] History and Physical  [name] UEA:[id number] DOB: July 01, 1924 DOA: 03/22/2014  Referring physician: Emergency PCP: [name], MD  Specialists: Gastroenterology  Chief Complaint: bleed  HPI: [name] is a 80 y.o. 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 She has no sick contacts does not take alcohol and sparingly takes ibuprofen or Aleve once in a while took the last 1 to 3 days ago She started having diarrhea last night and had about 2-3 episodes, first episode at 2030. She felt lightheaded at the time and felt she was in a blackout and could not sit up right. She did not have any abdominal pain. The stool was mixed with the blood but did not seem completely firm and was not completely formed. Next line is morning she awoke and when she was on the bathroom passing another bloody stool she also vomited dark brown vomit. She's never had these issues before She's never had a colonoscopy either He did not note any abdominal pain with vomiting but has had nausea since and was medicated for this in the emergency room.  She is a very healthy lady overall drives cooks and cleans herself lives alone She worked for 59 years with [employer] and [employer], she was never a smoker or drinker and finished 12th grade She had 2 husbands both of whom passed with strokes and has one child  Initial lab showed BUN/creatinine = 77/1.06, LFTs within normal limits Blood glucose 114 EC 11.8 Hemoglobin 10.9 with no baseline  EKG performed showed atrial sensed ventricular paced rhythm QRS axis extreme right  Review of Systems:  Denies chest pain Denies rash Denies dysuria Denies falls Denies blurred vision or double vision Denies other complaints except as noted above in organ system  review-all other organ systems were questioned and were negative   Past Medical History  Diagnosis Date  . Hypertension   . Facial rash   . Seborrheic keratoses   . Microscopic hematuria     with negative workup   . Fibrocystic disease of breast   . Anxiety   . Compression fracture of L1 lumbar vertebra   . Eustachian tube dysfunction   . Allergic rhinitis   . Rhomboid muscle pain     spasm  . Depression     situational  . Urinary frequency     with nocturia  . Headache   . UTI (lower urinary tract infection)   . Tendinopathy of rotator cuff   . Arthritis     Left AC joint   Past Surgical History  Procedure Laterality Date  . Insert / replace / remove pacemaker     Social History:  History   Social History Narrative    Allergies  Allergen Reactions  . Clindamycin/Lincomycin Rash  . Antihistamines, Chlorpheniramine-Type     nightmares  . Ativan [Lorazepam]     Possible: agitation  . Imiquimod   . Micardis Hct [Telmisartan-Hctz]     fatigue  . Thiazide-Type Diuretics     Possible photosensitivity  . Ultracet [Tramadol-Acetaminophen]     nightmares    Family History  Problem Relation Age of Onset  . Diabetes Mother   . Stroke Mother   . Coronary artery disease Mother   . Cancer - Lung Father   . Hypertension Sister   .  Hypertension Brother   . Diabetes Brother   . Cancer Brother   . Hypertension Brother   . Diabetes Brother   . Cancer Brother     Prior to Admission medications   Medication Sig Start Date End Date Taking? Authorizing Provider  amLODipine (NORVASC) 10 MG tablet Take 1 tablet (10 mg total) by mouth daily. 12/04/13  Yes Jettie Booze, MD  aspirin 81 MG tablet Take 81 mg by mouth daily.   Yes Historical Provider, MD  calcium carbonate (OS-CAL) 600 MG TABS tablet Take 600 mg by mouth daily.   Yes Historical Provider, MD  Camphor-Menthol (TIGER BALM REGULAR STRENGTH) 11-8 % OINT Apply topically as directed.   Yes Historical Provider,  MD  Cholecalciferol (VITAMIN D3) 2000 UNITS TABS Take 1 tablet by mouth daily.    Yes Historical Provider, MD  Cyanocobalamin (VITAMIN B-12 CR PO) Take 1 tablet by mouth daily.    Yes Historical Provider, MD  fish oil-omega-3 fatty acids 1000 MG capsule Take 2 g by mouth 2 (two) times daily.   Yes Historical Provider, MD  HYDROcodone-acetaminophen (NORCO/VICODIN) 5-325 MG per tablet Take 1 tablet by mouth every 6 (six) hours as needed for severe pain.  03/15/14  Yes Historical Provider, MD  Multiple Vitamin (MULTIVITAMIN) tablet Take 1 tablet by mouth daily.   Yes Historical Provider, MD  triamterene-hydrochlorothiazide (MAXZIDE-25) 37.5-25 MG per tablet Take 0.5 tablets by mouth daily.   Yes Historical Provider, MD   Physical Exam: Filed Vitals:   03/22/14 1000 03/22/14 1030 03/22/14 1115 03/22/14 1121  BP: 131/65 128/70 118/63 118/63  Pulse: 92 93 94 95  Resp: 22 24 19 17   SpO2: 100% 98% 97% 100%     General:  EOMI NCAT mild pallor  Eyes: Extraocular movements intact, arcus senilis  ENT: Dry mucosa poor dentition  Neck: Soft supple no JVD no bruit  Cardiovascular: S1-S2 tachycardic questionable murmur left lower sternal edge  Respiratory: Clinically clear no added sound  Abdomen: Soft nontender nondistended but slightly tender in the epigastric and upper quadrant  Skin: Lower extremities have a bite from her cat bite and she has some denudation of her skin  Musculoskeletal: Range of motion intact  Psychiatric: Pleasant oriented  Neurologic: Cranial nerves grossly intact, moves all 4 limbs equally, power 5/5, sensory grossly intact  Labs on Admission:  Basic Metabolic Panel:  Recent Labs Lab 03/22/14 1028  NA 140  K 3.7  CL 101  CO2 25  GLUCOSE 114*  BUN 77*  CREATININE 1.06  CALCIUM 9.8   Liver Function Tests:  Recent Labs Lab 03/22/14 1028  AST 19  ALT 21  ALKPHOS 40  BILITOT 0.4  PROT 5.8*  ALBUMIN 3.0*   No results for input(s): LIPASE, AMYLASE  in the last 168 hours. No results for input(s): AMMONIA in the last 168 hours. CBC:  Recent Labs Lab 03/22/14 1028  WBC 11.8*  HGB 10.9*  HCT 31.9*  MCV 88.6  PLT 162   Cardiac Enzymes: No results for input(s): CKTOTAL, CKMB, CKMBINDEX, TROPONINI in the last 168 hours.  BNP (last 3 results) No results for input(s): PROBNP in the last 8760 hours. CBG: No results for input(s): GLUCAP in the last 168 hours.  Radiological Exams on Admission: No results found.  EKG: Independently reviewed. As above  Assessment/Plan Principal Problem:   Upper GI bleed-patient started on Protonix PPI infusion which I agree with. Gastroenterology has been consulted as is very likely that since given her constellation clinical  findings as well as her exam and upper GI bleed due to she's never had a colonoscopy and this may need to be done at some point in the future as well although one may argue against it given current guidelines.. Duration and timing of resumption of aspirin and fish oil should be performed and discussed with gastroenterology.  I will keep on IV saline 100 cc per hour at least and hold her antihypertensive medications and she will not get DVT prophylaxis with heparin only with SCDs Active Problems:   Complete heart block s/p PPM 2009-doing fair. Monitor   Essential hypertension-hold amlodipine 10 mg daily, Maxide 37.5-25   CAD (coronary artery disease), CAth 04/2004 non obst-hold aspirin for right now until GI says ok to resume    Time spent: 20 DO NOT RESUSCITATE Discussed with daughter bedside   Nita Sells [facility] Pager [phone]  If 7PM-7AM, please contact night-coverage [email or web address] Password [facility] 03/22/2014, 12:23 PM

## 2014-03-22 NOTE — Consult Note (Signed)
[facility] Consultation Note  Referring Provider:  [facility] Primary Care Physician:  [name], MD  Reason for Consultation:  Hematemesis, melena  HPI: [name] is a 80 y.o. female whom we've been asked to see for evaluation of hematemesis and melena.  In static state of GI health until last night, at which time she began having several black tarry stools.  This morning, she developed some nausea and had a couple episodes of black/coffee ground emesis.  No bright red hematemesis.  No hematochezia.  No abdominal pain, GERD, dysphagia, change in bowel habits, loss-of-appetite, unintentional weight loss.  Baby ASA per day.  No other NSAIDs.  No prior episodes of GI bleeding.  No prior endoscopy or colonoscopy.   Past Medical History  Diagnosis Date  . Hypertension   . Facial rash   . Seborrheic keratoses   . Microscopic hematuria     with negative workup   . Fibrocystic disease of breast   . Anxiety   . Compression fracture of L1 lumbar vertebra   . Eustachian tube dysfunction   . Allergic rhinitis   . Rhomboid muscle pain     spasm  . Depression     situational  . Urinary frequency     with nocturia  . Headache   . UTI (lower urinary tract infection)   . Tendinopathy of rotator cuff   . Arthritis     Left AC joint    Past Surgical History  Procedure Laterality Date  . Insert / replace / remove pacemaker      Prior to Admission medications   Medication Sig Start Date End Date Taking? Authorizing Provider  amLODipine (NORVASC) 10 MG tablet Take 1 tablet (10 mg total) by mouth daily. 12/04/13  Yes Jettie Booze, MD  aspirin 81 MG tablet Take 81 mg by mouth daily.   Yes Historical Provider, MD  calcium carbonate (OS-CAL) 600 MG TABS tablet Take 600 mg by mouth daily.   Yes Historical Provider, MD  Camphor-Menthol (TIGER BALM REGULAR STRENGTH) 11-8 % OINT Apply topically as directed.   Yes Historical Provider, MD  Cholecalciferol  (VITAMIN D3) 2000 UNITS TABS Take 1 tablet by mouth daily.    Yes Historical Provider, MD  Cyanocobalamin (VITAMIN B-12 CR PO) Take 1 tablet by mouth daily.    Yes Historical Provider, MD  fish oil-omega-3 fatty acids 1000 MG capsule Take 2 g by mouth 2 (two) times daily.   Yes Historical Provider, MD  HYDROcodone-acetaminophen (NORCO/VICODIN) 5-325 MG per tablet Take 1 tablet by mouth every 6 (six) hours as needed for severe pain.  03/15/14  Yes Historical Provider, MD  Multiple Vitamin (MULTIVITAMIN) tablet Take 1 tablet by mouth daily.   Yes Historical Provider, MD  triamterene-hydrochlorothiazide (MAXZIDE-25) 37.5-25 MG per tablet Take 0.5 tablets by mouth daily.   Yes Historical Provider, MD    Current Facility-Administered Medications  Medication Dose Route Frequency Provider Last Rate Last Dose  . pantoprazole (PROTONIX) 80 mg in sodium chloride 0.9 % 250 mL (0.32 mg/mL) infusion  8 mg/hr Intravenous Continuous Hoy Morn, MD 25 mL/hr at 03/22/14 1124 8 mg/hr at 03/22/14 1124   Current Outpatient Prescriptions  Medication Sig Dispense Refill  . amLODipine (NORVASC) 10 MG tablet Take 1 tablet (10 mg total) by mouth daily. 90 tablet 3  . aspirin 81 MG tablet Take 81 mg by mouth daily.    . calcium carbonate (OS-CAL) 600 MG TABS tablet Take 600 mg by mouth daily.    Marland Kitchen  Camphor-Menthol (TIGER BALM REGULAR STRENGTH) 11-8 % OINT Apply topically as directed.    . Cholecalciferol (VITAMIN D3) 2000 UNITS TABS Take 1 tablet by mouth daily.     . Cyanocobalamin (VITAMIN B-12 CR PO) Take 1 tablet by mouth daily.     . fish oil-omega-3 fatty acids 1000 MG capsule Take 2 g by mouth 2 (two) times daily.    Marland Kitchen HYDROcodone-acetaminophen (NORCO/VICODIN) 5-325 MG per tablet Take 1 tablet by mouth every 6 (six) hours as needed for severe pain.     . Multiple Vitamin (MULTIVITAMIN) tablet Take 1 tablet by mouth daily.    Marland Kitchen triamterene-hydrochlorothiazide (MAXZIDE-25) 37.5-25 MG per tablet Take 0.5 tablets  by mouth daily.      Allergies as of 03/22/2014 - Review Complete 03/22/2014  Allergen Reaction Noted  . Clindamycin/lincomycin Rash 03/22/2014  . Antihistamines, chlorpheniramine-type  05/16/2013  . Ativan [lorazepam]  05/16/2013  . Imiquimod  02/07/2013  . Micardis hct [telmisartan-hctz]  05/16/2013  . Thiazide-type diuretics  05/16/2013  . Ultracet [tramadol-acetaminophen]  05/16/2013    Family History  Problem Relation Age of Onset  . Diabetes Mother   . Stroke Mother   . Coronary artery disease Mother   . Cancer - Lung Father   . Hypertension Sister   . Hypertension Brother   . Diabetes Brother   . Cancer Brother   . Hypertension Brother   . Diabetes Brother   . Cancer Brother     History   Social History  . Marital Status: Widowed    Spouse Name: [name]    Number of Children: [name]  . Years of Education: [name]   Occupational History  . Not on file.   Social History Main Topics  . Smoking status: Never Smoker   . Smokeless tobacco: Not on file  . Alcohol Use: No  . Drug Use: No  . Sexual Activity: Not on file   Other Topics Concern  . Not on file   Social History Narrative    Review of Systems: Positive = bikd Gen: Denies any fever, chills, rigors, night sweats, anorexia, fatigue, weakness, malaise, involuntary weight loss, and sleep disorder CV: Denies chest pain, angina, palpitations, syncope, orthopnea, PND, peripheral edema, and claudication. Resp: Denies dyspnea, cough, sputum, wheezing, coughing up blood. GI: Described in detail in HPI.    GU : Denies urinary burning, blood in urine, urinary frequency, urinary hesitancy, nocturnal urination, and urinary incontinence. MS: Denies joint pain or swelling.  Denies muscle weakness, cramps, atrophy.  Derm: Denies rash, itching, oral ulcerations, hives, unhealing ulcers.  Psych: Denies depression, anxiety, memory loss, suicidal ideation, hallucinations,  and confusion. Heme: Denies bruising, bleeding, and  enlarged lymph nodes. Neuro:  Denies any headaches, dizziness, paresthesias. Endo:  Denies any problems with DM, thyroid, adrenal function.  Physical Exam: Vital signs in last 24 hours: Pulse Rate:  [87-95] 95 (11/27 1121) Resp:  [17-27] 17 (11/27 1121) BP: (118-131)/(63-70) 118/63 mmHg (11/27 1121) SpO2:  [97 %-100 %] 100 % (11/27 1121)   General:   Alert,  Well-developed, well-nourished, pleasant and cooperative in NAD, elderly but much younger-appearing than stated age Head:  Normocephalic and atraumatic. Eyes:  Sclera clear, no icterus.   Conjunctiva slightly pale Ears:  Normal auditory acuity. Nose:  No deformity, discharge,  or lesions. Mouth:  No deformity or lesions.  Oropharynx pink & moist. Neck:  Supple; no masses or thyromegaly. Lungs:  Clear throughout to auscultation.   No wheezes, crackles, or rhonchi. No acute distress. Heart:  Regular rate and rhythm; no murmurs, clicks, rubs,  or gallops. Abdomen:  Soft, nontender and nondistended. No masses, hepatosplenomegaly or hernias noted. Normal bowel sounds, without guarding, and without rebound.     Msk:  Symmetrical without gross deformities. Normal posture. Pulses:  Normal pulses noted. Extremities:  Without clubbing or edema. Neurologic:  Alert and  oriented x4;  Diffusely weak, otherwise grossly normal neurologically. Skin:  Rare ecchymoses scattered, otherwise intact without significant lesions or rashes. Cervical Nodes:  No significant cervical adenopathy. Psych:  Alert and cooperative. Normal mood and affect.   Lab Results:  Recent Labs  03/22/14 1028  WBC 11.8*  HGB 10.9*  HCT 31.9*  PLT 162   BMET  Recent Labs  03/22/14 1028  NA 140  K 3.7  CL 101  CO2 25  GLUCOSE 114*  BUN 77*  CREATININE 1.06  CALCIUM 9.8   LFT  Recent Labs  03/22/14 1028  PROT 5.8*  ALBUMIN 3.0*  AST 19  ALT 21  ALKPHOS 40  BILITOT 0.4   PT/INR  Recent Labs  03/22/14 1028  LABPROT 13.7  INR 1.04     Studies/Results: No results found.  Impression:  1.  Melena and hematemesis.  Hemodynamically stable.  Suspect peptic ulcer. 2.  Acute blood loss anemia, likely from #1 above.  Plan:  1.  PPI. 2.  Volume repletion. 3.  Follow CBCs, and transfuse PRBCs as needed. 4.  Endoscopy today. 5.  Risks (bleeding, infection, bowel perforation that could require surgery, sedation-related changes in cardiopulmonary systems), benefits (identification and possible treatment of source of symptoms, exclusion of certain causes of symptoms), and alternatives (watchful waiting, radiographic imaging studies, empiric medical treatment) of upper endoscopy (EGD) were explained to patient/family in detail and patient wishes to proceed.   LOS: 0 days   Nicey Krah M  03/22/2014, 12:24 PM

## 2014-03-22 NOTE — H&P (View-Only) (Signed)
[facility] Consultation Note  Referring Provider:  [facility] Primary Care Physician:  [name], MD  Reason for Consultation:  Hematemesis, melena  HPI: [name] is a 80 y.o. female whom we've been asked to see for evaluation of hematemesis and melena.  In static state of GI health until last night, at which time she began having several black tarry stools.  This morning, she developed some nausea and had a couple episodes of black/coffee ground emesis.  No bright red hematemesis.  No hematochezia.  No abdominal pain, GERD, dysphagia, change in bowel habits, loss-of-appetite, unintentional weight loss.  Baby ASA per day.  No other NSAIDs.  No prior episodes of GI bleeding.  No prior endoscopy or colonoscopy.   Past Medical History  Diagnosis Date  . Hypertension   . Facial rash   . Seborrheic keratoses   . Microscopic hematuria     with negative workup   . Fibrocystic disease of breast   . Anxiety   . Compression fracture of L1 lumbar vertebra   . Eustachian tube dysfunction   . Allergic rhinitis   . Rhomboid muscle pain     spasm  . Depression     situational  . Urinary frequency     with nocturia  . Headache   . UTI (lower urinary tract infection)   . Tendinopathy of rotator cuff   . Arthritis     Left AC joint    Past Surgical History  Procedure Laterality Date  . Insert / replace / remove pacemaker      Prior to Admission medications   Medication Sig Start Date End Date Taking? Authorizing Provider  amLODipine (NORVASC) 10 MG tablet Take 1 tablet (10 mg total) by mouth daily. 12/04/13  Yes Jettie Booze, MD  aspirin 81 MG tablet Take 81 mg by mouth daily.   Yes Historical Provider, MD  calcium carbonate (OS-CAL) 600 MG TABS tablet Take 600 mg by mouth daily.   Yes Historical Provider, MD  Camphor-Menthol (TIGER BALM REGULAR STRENGTH) 11-8 % OINT Apply topically as directed.   Yes Historical Provider, MD  Cholecalciferol  (VITAMIN D3) 2000 UNITS TABS Take 1 tablet by mouth daily.    Yes Historical Provider, MD  Cyanocobalamin (VITAMIN B-12 CR PO) Take 1 tablet by mouth daily.    Yes Historical Provider, MD  fish oil-omega-3 fatty acids 1000 MG capsule Take 2 g by mouth 2 (two) times daily.   Yes Historical Provider, MD  HYDROcodone-acetaminophen (NORCO/VICODIN) 5-325 MG per tablet Take 1 tablet by mouth every 6 (six) hours as needed for severe pain.  03/15/14  Yes Historical Provider, MD  Multiple Vitamin (MULTIVITAMIN) tablet Take 1 tablet by mouth daily.   Yes Historical Provider, MD  triamterene-hydrochlorothiazide (MAXZIDE-25) 37.5-25 MG per tablet Take 0.5 tablets by mouth daily.   Yes Historical Provider, MD    Current Facility-Administered Medications  Medication Dose Route Frequency Provider Last Rate Last Dose  . pantoprazole (PROTONIX) 80 mg in sodium chloride 0.9 % 250 mL (0.32 mg/mL) infusion  8 mg/hr Intravenous Continuous Hoy Morn, MD 25 mL/hr at 03/22/14 1124 8 mg/hr at 03/22/14 1124   Current Outpatient Prescriptions  Medication Sig Dispense Refill  . amLODipine (NORVASC) 10 MG tablet Take 1 tablet (10 mg total) by mouth daily. 90 tablet 3  . aspirin 81 MG tablet Take 81 mg by mouth daily.    . calcium carbonate (OS-CAL) 600 MG TABS tablet Take 600 mg by mouth daily.    Marland Kitchen  Camphor-Menthol (TIGER BALM REGULAR STRENGTH) 11-8 % OINT Apply topically as directed.    . Cholecalciferol (VITAMIN D3) 2000 UNITS TABS Take 1 tablet by mouth daily.     . Cyanocobalamin (VITAMIN B-12 CR PO) Take 1 tablet by mouth daily.     . fish oil-omega-3 fatty acids 1000 MG capsule Take 2 g by mouth 2 (two) times daily.    Marland Kitchen HYDROcodone-acetaminophen (NORCO/VICODIN) 5-325 MG per tablet Take 1 tablet by mouth every 6 (six) hours as needed for severe pain.     . Multiple Vitamin (MULTIVITAMIN) tablet Take 1 tablet by mouth daily.    Marland Kitchen triamterene-hydrochlorothiazide (MAXZIDE-25) 37.5-25 MG per tablet Take 0.5 tablets  by mouth daily.      Allergies as of 03/22/2014 - Review Complete 03/22/2014  Allergen Reaction Noted  . Clindamycin/lincomycin Rash 03/22/2014  . Antihistamines, chlorpheniramine-type  05/16/2013  . Ativan [lorazepam]  05/16/2013  . Imiquimod  02/07/2013  . Micardis hct [telmisartan-hctz]  05/16/2013  . Thiazide-type diuretics  05/16/2013  . Ultracet [tramadol-acetaminophen]  05/16/2013    Family History  Problem Relation Age of Onset  . Diabetes Mother   . Stroke Mother   . Coronary artery disease Mother   . Cancer - Lung Father   . Hypertension Sister   . Hypertension Brother   . Diabetes Brother   . Cancer Brother   . Hypertension Brother   . Diabetes Brother   . Cancer Brother     History   Social History  . Marital Status: Widowed    Spouse Name: [name]    Number of Children: [name]  . Years of Education: [name]   Occupational History  . Not on file.   Social History Main Topics  . Smoking status: Never Smoker   . Smokeless tobacco: Not on file  . Alcohol Use: No  . Drug Use: No  . Sexual Activity: Not on file   Other Topics Concern  . Not on file   Social History Narrative    Review of Systems: Positive = bikd Gen: Denies any fever, chills, rigors, night sweats, anorexia, fatigue, weakness, malaise, involuntary weight loss, and sleep disorder CV: Denies chest pain, angina, palpitations, syncope, orthopnea, PND, peripheral edema, and claudication. Resp: Denies dyspnea, cough, sputum, wheezing, coughing up blood. GI: Described in detail in HPI.    GU : Denies urinary burning, blood in urine, urinary frequency, urinary hesitancy, nocturnal urination, and urinary incontinence. MS: Denies joint pain or swelling.  Denies muscle weakness, cramps, atrophy.  Derm: Denies rash, itching, oral ulcerations, hives, unhealing ulcers.  Psych: Denies depression, anxiety, memory loss, suicidal ideation, hallucinations,  and confusion. Heme: Denies bruising, bleeding, and  enlarged lymph nodes. Neuro:  Denies any headaches, dizziness, paresthesias. Endo:  Denies any problems with DM, thyroid, adrenal function.  Physical Exam: Vital signs in last 24 hours: Pulse Rate:  [87-95] 95 (11/27 1121) Resp:  [17-27] 17 (11/27 1121) BP: (118-131)/(63-70) 118/63 mmHg (11/27 1121) SpO2:  [97 %-100 %] 100 % (11/27 1121)   General:   Alert,  Well-developed, well-nourished, pleasant and cooperative in NAD, elderly but much younger-appearing than stated age Head:  Normocephalic and atraumatic. Eyes:  Sclera clear, no icterus.   Conjunctiva slightly pale Ears:  Normal auditory acuity. Nose:  No deformity, discharge,  or lesions. Mouth:  No deformity or lesions.  Oropharynx pink & moist. Neck:  Supple; no masses or thyromegaly. Lungs:  Clear throughout to auscultation.   No wheezes, crackles, or rhonchi. No acute distress. Heart:  Regular rate and rhythm; no murmurs, clicks, rubs,  or gallops. Abdomen:  Soft, nontender and nondistended. No masses, hepatosplenomegaly or hernias noted. Normal bowel sounds, without guarding, and without rebound.     Msk:  Symmetrical without gross deformities. Normal posture. Pulses:  Normal pulses noted. Extremities:  Without clubbing or edema. Neurologic:  Alert and  oriented x4;  Diffusely weak, otherwise grossly normal neurologically. Skin:  Rare ecchymoses scattered, otherwise intact without significant lesions or rashes. Cervical Nodes:  No significant cervical adenopathy. Psych:  Alert and cooperative. Normal mood and affect.   Lab Results:  Recent Labs  03/22/14 1028  WBC 11.8*  HGB 10.9*  HCT 31.9*  PLT 162   BMET  Recent Labs  03/22/14 1028  NA 140  K 3.7  CL 101  CO2 25  GLUCOSE 114*  BUN 77*  CREATININE 1.06  CALCIUM 9.8   LFT  Recent Labs  03/22/14 1028  PROT 5.8*  ALBUMIN 3.0*  AST 19  ALT 21  ALKPHOS 40  BILITOT 0.4   PT/INR  Recent Labs  03/22/14 1028  LABPROT 13.7  INR 1.04     Studies/Results: No results found.  Impression:  1.  Melena and hematemesis.  Hemodynamically stable.  Suspect peptic ulcer. 2.  Acute blood loss anemia, likely from #1 above.  Plan:  1.  PPI. 2.  Volume repletion. 3.  Follow CBCs, and transfuse PRBCs as needed. 4.  Endoscopy today. 5.  Risks (bleeding, infection, bowel perforation that could require surgery, sedation-related changes in cardiopulmonary systems), benefits (identification and possible treatment of source of symptoms, exclusion of certain causes of symptoms), and alternatives (watchful waiting, radiographic imaging studies, empiric medical treatment) of upper endoscopy (EGD) were explained to patient/family in detail and patient wishes to proceed.   LOS: 0 days   Marshall Roehrich M  03/22/2014, 12:24 PM

## 2014-03-22 NOTE — ED Provider Notes (Addendum)
CSN: [id number]     Arrival date & time 03/22/14  0908 History   First MD Initiated Contact with Patient 03/22/14 0914     Chief Complaint  Patient presents with  . GI Bleeding      HPI Reports of dark frequent stools last night followed by developing nausea vomiting today with dark vomit.  No bright red blood noted.  No prior history of GI bleeding.  No use of anticoagulants.  Patient and family deny adamantly use of nonsteroidals or other GI/gastric irritants.  Patient feels slightly weak and was lightheaded earlier today.  Denies abdominal pain.  No upper abdominal pain.  Was having some mid back pain last week.   Past Medical History  Diagnosis Date  . Hypertension   . Facial rash   . Seborrheic keratoses   . Microscopic hematuria     with negative workup   . Fibrocystic disease of breast   . Anxiety   . Compression fracture of L1 lumbar vertebra   . Eustachian tube dysfunction   . Allergic rhinitis   . Rhomboid muscle pain     spasm  . Depression     situational  . Urinary frequency     with nocturia  . Headache   . UTI (lower urinary tract infection)   . Tendinopathy of rotator cuff   . Arthritis     Left AC joint   Past Surgical History  Procedure Laterality Date  . Insert / replace / remove pacemaker     Family History  Problem Relation Age of Onset  . Diabetes Mother   . Stroke Mother   . Coronary artery disease Mother   . Cancer - Lung Father   . Hypertension Sister   . Hypertension Brother   . Diabetes Brother   . Cancer Brother   . Hypertension Brother   . Diabetes Brother   . Cancer Brother    History  Substance Use Topics  . Smoking status: Never Smoker   . Smokeless tobacco: Not on file  . Alcohol Use: No   OB History    No data available     Review of Systems  All other systems reviewed and are negative.     Allergies  Antihistamines, chlorpheniramine-type; Ativan; Imiquimod; Micardis hct; Thiazide-type diuretics; and  Ultracet  Home Medications   Prior to Admission medications   Medication Sig Start Date End Date Taking? Authorizing Provider  amLODipine (NORVASC) 10 MG tablet Take 1 tablet (10 mg total) by mouth daily. 12/04/13   Jettie Booze, MD  aspirin 81 MG tablet Take 81 mg by mouth daily.    Historical Provider, MD  calcium carbonate (OS-CAL) 600 MG TABS tablet Take 600 mg by mouth daily.    Historical Provider, MD  Camphor-Menthol (TIGER BALM REGULAR STRENGTH) 11-8 % OINT Apply topically as directed.    Historical Provider, MD  Cholecalciferol (VITAMIN D3) 2000 UNITS TABS Take by mouth daily.    Historical Provider, MD  Cyanocobalamin (VITAMIN B-12 CR PO) Take by mouth daily.    Historical Provider, MD  fish oil-omega-3 fatty acids 1000 MG capsule Take 2 g by mouth 2 (two) times daily.    Historical Provider, MD  Multiple Vitamin (MULTIVITAMIN) tablet Take 1 tablet by mouth daily.    Historical Provider, MD  triamterene-hydrochlorothiazide (MAXZIDE-25) 37.5-25 MG per tablet Take 0.5 tablets by mouth daily.    Historical Provider, MD   There were no vitals taken for this visit. Physical Exam  Constitutional: She is oriented to person, place, and time. She appears well-developed and well-nourished. No distress.  HENT:  Head: Normocephalic and atraumatic.  Eyes: EOM are normal.  Neck: Normal range of motion.  Cardiovascular: Normal rate, regular rhythm and normal heart sounds.   Pulmonary/Chest: Effort normal and breath sounds normal.  Abdominal: Soft. She exhibits no distension. There is no tenderness.  Genitourinary:  Melena on rectal exam  Musculoskeletal: Normal range of motion.  Neurological: She is alert and oriented to person, place, and time.  Skin: Skin is warm and dry.  Psychiatric: She has a normal mood and affect. Judgment normal.  Nursing note and vitals reviewed.   ED Course  Procedures (including critical care time) Labs Review Labs Reviewed  CBC - Abnormal; Notable  for the following:    WBC 11.8 (*)    RBC 3.60 (*)    Hemoglobin 10.9 (*)    HCT 31.9 (*)    All other components within normal limits  COMPREHENSIVE METABOLIC PANEL - Abnormal; Notable for the following:    Glucose, Bld 114 (*)    BUN 77 (*)    Total Protein 5.8 (*)    Albumin 3.0 (*)    GFR calc non Af Amer 46 (*)    GFR calc Af Amer 53 (*)    All other components within normal limits  PROTIME-INR  OCCULT BLOOD X 1 CARD TO LAB, STOOL  POC OCCULT BLOOD, ED  TYPE AND SCREEN  ABO/RH    Imaging Review No results found.   EKG Interpretation None      MDM   Final diagnoses:  Upper GI bleed    Suspect upper GI bleed.  Protonix drip.  No obvious GI irritants used.  Melena on examination.  Patient will need admission to the hospital likely endoscopy. Elevated BUN. HR improved with IV fluids  GI: case discussed with Dr Iva Lento, MD 03/22/14 Max Meadows, MD 03/22/14 1158

## 2014-03-22 NOTE — ED Notes (Signed)
Spoke with pharmacy requesting Protonix to be sent to [facility]

## 2014-03-22 NOTE — Interval H&P Note (Signed)
History and Physical Interval Note:  03/22/2014 1:07 PM  [name]  has presented today for surgery, with the diagnosis of hematemesis, melena, anemia  The various methods of treatment have been discussed with the patient and family. After consideration of risks, benefits and other options for treatment, the patient has consented to  Procedure(s): ESOPHAGOGASTRODUODENOSCOPY (EGD) (Left) as a surgical intervention .  The patient's history has been reviewed, patient examined, no change in status, stable for surgery.  I have reviewed the patient's chart and labs.  Questions were answered to the patient's satisfaction.     Orean Giarratano M  Assessment:  1.  Hematemesis. 2.  Melena. 3.  Anemia.  Plan:  1.  Endoscopy. 2.  Risks (bleeding, infection, bowel perforation that could require surgery, sedation-related changes in cardiopulmonary systems), benefits (identification and possible treatment of source of symptoms, exclusion of certain causes of symptoms), and alternatives (watchful waiting, radiographic imaging studies, empiric medical treatment) of upper endoscopy (EGD) were explained to patient/family in detail and patient wishes to proceed.

## 2014-03-22 NOTE — Op Note (Signed)
[facility] [place], [id number]   ENDOSCOPY PROCEDURE REPORT  PATIENT: [name], [name]  MR#: [id number] BIRTHDATE: [birth year] , 89  yrs. old GENDER: female ENDOSCOPIST: Arta Silence, MD REFERRED BY:  [facility] PROCEDURE DATE:  04-21-14 PROCEDURE:  EGD w/ control of bleeding ASA CLASS:     Class II INDICATIONS:  hematemesis, melena, anemia. MEDICATIONS: Fentanyl 25 mcg IV and Versed 2 mg IV TOPICAL ANESTHETIC:  DESCRIPTION OF PROCEDURE: After the risks benefits and alternatives of the procedure were thoroughly explained, informed consent was obtained.  The Pentax Gastroscope [id number] endoscope was introduced through the mouth and advanced to the second portion of the duodenum. The instrument was slowly withdrawn as the mucosa was fully examined.    Findings:  Small hiatal hernia.  Scattered small linear erosions in the fundus and body of the stomach.  Significant edema and distortion of the pre-pyloric antrum.  With time and copious insufflation, two punctate kissing ulcers, each about 54mm in diameter, were noted in the pre-pyloric antrum.  One of the ulcers had a small visible vessel.  The vessel was obliterated with 7Fr Bicap probe (20w).  The pyloric channel was slightly narrowed from edema, but the diagnostic endoscope was able to traverse the pylorus into the duodenum.  Duodenum to the second portion was normal.  No old or fresh blood was seen to the extent of our examination.              The scope was then withdrawn from the patient and the procedure completed.  COMPLICATIONS: There were no immediate complications.  ENDOSCOPIC IMPRESSION:     Kissing pre-pyloric gastric ulcers, one with visible vessel, treated as above.  Ulcers highly likely the source of the patient's hematemesis, melena, anemia.  Etiology of the ulcers NSAIDs versus H. pylori.  RECOMMENDATIONS:     1.  Watch for potential complications  of procedure. 2.  Clear liquids ok today, advance tomorrow if no obvious further rebleeding. 3.  Check H. pylori serologies. 4.  PPI drip for  48 hours. 5.  [facility] will follow.  eSigned:  Arta Silence, MD 2014/04/21 2:09 PM   CC:  CPT CODES: ICD CODES:  The ICD and CPT codes recommended by this software are interpretations from the data that the clinical staff has captured with the software.  The verification of the translation of this report to the ICD and CPT codes and modifiers is the sole responsibility of the health care institution and practicing physician where this report was generated.  [employer] will not be held responsible for the validity of the ICD and CPT codes included on this report.  AMA assumes no liability for data contained or not contained herein. CPT is a Designer, television/film set of the [employer].

## 2014-03-22 NOTE — ED Notes (Signed)
Patient transported to Endoscopy

## 2014-03-23 LAB — COMPREHENSIVE METABOLIC PANEL
ALT: 14 U/L (ref 0–35)
AST: 13 U/L (ref 0–37)
Albumin: 2.5 g/dL — ABNORMAL LOW (ref 3.5–5.2)
Alkaline Phosphatase: 30 U/L — ABNORMAL LOW (ref 39–117)
Anion gap: 9 (ref 5–15)
BILIRUBIN TOTAL: 0.4 mg/dL (ref 0.3–1.2)
BUN: 61 mg/dL — ABNORMAL HIGH (ref 6–23)
CALCIUM: 9.4 mg/dL (ref 8.4–10.5)
CHLORIDE: 107 meq/L (ref 96–112)
CO2: 27 mEq/L (ref 19–32)
CREATININE: 1.06 mg/dL (ref 0.50–1.10)
GFR calc Af Amer: 53 mL/min — ABNORMAL LOW (ref >90)
GFR, EST NON AFRICAN AMERICAN: 46 mL/min — AB (ref >90)
Glucose, Bld: 95 mg/dL (ref 70–99)
Potassium: 3.5 mEq/L — ABNORMAL LOW (ref 3.7–5.3)
Sodium: 143 mEq/L (ref 137–147)
Total Protein: 4.6 g/dL — ABNORMAL LOW (ref 6.0–8.3)

## 2014-03-23 LAB — CBC
HCT: 23.8 % — ABNORMAL LOW (ref 36.0–46.0)
HCT: 21 % — ABNORMAL LOW (ref 36.0–46.0)
Hemoglobin: 8.2 g/dL — ABNORMAL LOW (ref 12.0–15.0)
Hemoglobin: 7.1 g/dL — ABNORMAL LOW (ref 12.0–15.0)
MCH: 29.8 pg (ref 26.0–34.0)
MCH: 33.8 pg (ref 26.0–34.0)
MCHC: 34.5 g/dL (ref 30.0–36.0)
MCHC: 33.8 g/dL (ref 30.0–36.0)
MCV: 86.5 fL (ref 78.0–100.0)
MCV: 89.4 fL (ref 78.0–100.0)
PLATELETS: 125 10*3/uL — AB (ref 150–400)
PLATELETS: 108 10*3/uL — AB (ref 150–400)
RBC: 2.75 MIL/uL — ABNORMAL LOW (ref 3.87–5.11)
RBC: 2.35 MIL/uL — ABNORMAL LOW (ref 3.87–5.11)
RDW: 14.9 % (ref 11.5–15.5)
RDW: 49.6 % — AB (ref 11.5–15.5)
WBC: 6.5 10*3/uL (ref 4.0–10.5)
WBC: 5.7 10*3/uL (ref 4.0–10.5)

## 2014-03-23 LAB — PROTIME-INR
INR: 1.07 (ref 0.00–1.49)
PROTHROMBIN TIME: 14 s (ref 11.6–15.2)

## 2014-03-23 LAB — OCCULT BLOOD, POC DEVICE: FECAL OCCULT BLD: POSITIVE — AB

## 2014-03-23 MED ORDER — ONE-DAILY MULTI VITAMINS PO TABS: 1.0000 | ORAL_TABLET | Freq: Every day | ORAL | Status: DC

## 2014-03-23 MED ORDER — CALCIUM CARBONATE 1250 (500 CA) MG PO TABS
1.0000 | ORAL_TABLET | Freq: Every day | ORAL | Status: DC
  Administered 2014-03-23 – 2014-03-25 (×3): 500 mg via ORAL
  Filled 2014-03-23 (×4): qty 1

## 2014-03-23 MED ORDER — PNEUMOCOCCAL VAC POLYVALENT 25 MCG/0.5ML IJ INJ
0.5000 mL | INJECTION | INTRAMUSCULAR | Status: AC
  Administered 2014-03-24: 0.5 mL via INTRAMUSCULAR
  Filled 2014-03-23: qty 0.5

## 2014-03-23 MED ORDER — PROSIGHT PO TABS
1.0000 | ORAL_TABLET | Freq: Every day | ORAL | Status: DC
  Administered 2014-03-23 – 2014-03-25 (×3): 1 via ORAL
  Filled 2014-03-23 (×3): qty 1

## 2014-03-23 MED ORDER — SODIUM CHLORIDE 0.9 % IV SOLN
INTRAVENOUS | Status: DC
  Administered 2014-03-23 – 2014-03-24 (×2): via INTRAVENOUS

## 2014-03-23 MED ORDER — OMEGA-3 FATTY ACIDS 1000 MG PO CAPS: 2.0000 g | ORAL_CAPSULE | Freq: Two times a day (BID) | ORAL | Status: DC

## 2014-03-23 MED ORDER — POTASSIUM CHLORIDE CRYS ER 20 MEQ PO TBCR
40.0000 meq | EXTENDED_RELEASE_TABLET | Freq: Once | ORAL | Status: AC
  Administered 2014-03-23: 40 meq via ORAL
  Filled 2014-03-23: qty 2

## 2014-03-23 MED ORDER — CALCIUM CARBONATE 600 MG PO TABS: 600.0000 mg | ORAL_TABLET | Freq: Every day | ORAL | Status: DC

## 2014-03-23 MED ORDER — OMEGA-3-ACID ETHYL ESTERS 1 G PO CAPS
2.0000 g | ORAL_CAPSULE | Freq: Two times a day (BID) | ORAL | Status: DC
  Administered 2014-03-23 – 2014-03-25 (×5): 2 g via ORAL
  Filled 2014-03-23 (×6): qty 2

## 2014-03-23 MED ORDER — VITAMIN D3 50 MCG (2000 UT) PO TABS: 1.0000 | ORAL_TABLET | Freq: Every day | ORAL | Status: DC

## 2014-03-23 MED ORDER — VITAMIN D3 25 MCG (1000 UNIT) PO TABS
1000.0000 [IU] | ORAL_TABLET | Freq: Every day | ORAL | Status: DC
  Administered 2014-03-23 – 2014-03-25 (×3): 1000 [IU] via ORAL
  Filled 2014-03-23 (×3): qty 1

## 2014-03-23 NOTE — Progress Notes (Signed)
Nutrition Brief Note  Patient identified on the Malnutrition Screening Tool (MST) Report  Wt Readings from Last 15 Encounters:  03/23/14 126 lb 9.6 oz (57.425 kg)  11/21/13 129 lb (58.514 kg)  05/18/13 127 lb (57.607 kg)    Body mass index is 21.07 kg/(m^2). Patient meets criteria for Normal Weight based on current BMI. Patient denies any weight loss, estimating usual body weight around 130 lbs.  Current diet order is Full Liquids, patient is consuming approximately 100% of meals at this time. Pt reports eating well PTA and having a good appetite. She tolerated full liquids at lunch and feels fine. Per RN, pt can be advanced to a regular diet now. Per patient and MD note, likely d/c in the AM.  Labs and medications reviewed.   No nutrition interventions warranted at this time. If nutrition issues arise, please consult RD.   Pryor Ochoa RD, LDN Inpatient Clinical Dietitian Pager: [phone] After Hours Pager: [phone]

## 2014-03-23 NOTE — Progress Notes (Signed)
[facility] Progress Note  Subjective: No stools, no abdominal pain  Objective: Vital signs in last 24 hours: Temp:  [97.2 F (36.2 C)-98.4 F (36.9 C)] 97.6 F (36.4 C) (11/28 0830) Pulse Rate:  [73-121] 73 (11/28 0830) Resp:  [16-29] 17 (11/28 0830) BP: (96-151)/(44-71) 105/44 mmHg (11/28 0830) SpO2:  [93 %-100 %] 95 % (11/28 0830) Weight:  [57.153 kg (126 lb)-57.561 kg (126 lb 14.4 oz)] 57.425 kg (126 lb 9.6 oz) (11/28 0417) Weight change:    PE: Abdomen soft nondistended with normoactive bowel sounds  Lab Results: Results for orders placed or performed during the hospital encounter of 03/22/14 (from the past 24 hour(s))  Type and screen     Status: None   Collection Time: 03/22/14 10:25 AM  Result Value Ref Range   ABO/RH(D) A POS    Antibody Screen NEG    Sample Expiration 03/25/2014   ABO/Rh     Status: None   Collection Time: 03/22/14 10:25 AM  Result Value Ref Range   ABO/RH(D) A POS   CBC     Status: Abnormal   Collection Time: 03/22/14 10:28 AM  Result Value Ref Range   WBC 11.8 (H) 4.0 - 10.5 K/uL   RBC 3.60 (L) 3.87 - 5.11 MIL/uL   Hemoglobin 10.9 (L) 12.0 - 15.0 g/dL   HCT 31.9 (L) 36.0 - 46.0 %   MCV 88.6 78.0 - 100.0 fL   MCH 30.3 26.0 - 34.0 pg   MCHC 34.2 30.0 - 36.0 g/dL   RDW 14.4 11.5 - 15.5 %   Platelets 162 150 - 400 K/uL  Comprehensive metabolic panel     Status: Abnormal   Collection Time: 03/22/14 10:28 AM  Result Value Ref Range   Sodium 140 137 - 147 mEq/L   Potassium 3.7 3.7 - 5.3 mEq/L   Chloride 101 96 - 112 mEq/L   CO2 25 19 - 32 mEq/L   Glucose, Bld 114 (H) 70 - 99 mg/dL   BUN 77 (H) 6 - 23 mg/dL   Creatinine, Ser 1.06 0.50 - 1.10 mg/dL   Calcium 9.8 8.4 - 10.5 mg/dL   Total Protein 5.8 (L) 6.0 - 8.3 g/dL   Albumin 3.0 (L) 3.5 - 5.2 g/dL   AST 19 0 - 37 U/L   ALT 21 0 - 35 U/L   Alkaline Phosphatase 40 39 - 117 U/L   Total Bilirubin 0.4 0.3 - 1.2 mg/dL   GFR calc non Af Amer 46 (L) >90 mL/min   GFR calc Af Amer  53 (L) >90 mL/min   Anion gap 14 5 - 15  Protime-INR     Status: None   Collection Time: 03/22/14 10:28 AM  Result Value Ref Range   Prothrombin Time 13.7 11.6 - 15.2 seconds   INR 1.04 0.00 - 1.49  MRSA PCR Screening     Status: None   Collection Time: 03/22/14  3:12 PM  Result Value Ref Range   MRSA by PCR NEGATIVE NEGATIVE  Comprehensive metabolic panel     Status: Abnormal   Collection Time: 03/23/14  2:10 AM  Result Value Ref Range   Sodium 143 137 - 147 mEq/L   Potassium 3.5 (L) 3.7 - 5.3 mEq/L   Chloride 107 96 - 112 mEq/L   CO2 27 19 - 32 mEq/L   Glucose, Bld 95 70 - 99 mg/dL   BUN 61 (H) 6 - 23 mg/dL   Creatinine, Ser 1.06 0.50 - 1.10 mg/dL  Calcium 9.4 8.4 - 10.5 mg/dL   Total Protein 4.6 (L) 6.0 - 8.3 g/dL   Albumin 2.5 (L) 3.5 - 5.2 g/dL   AST 13 0 - 37 U/L   ALT 14 0 - 35 U/L   Alkaline Phosphatase 30 (L) 39 - 117 U/L   Total Bilirubin 0.4 0.3 - 1.2 mg/dL   GFR calc non Af Amer 46 (L) >90 mL/min   GFR calc Af Amer 53 (L) >90 mL/min   Anion gap 9 5 - 15  CBC     Status: Abnormal   Collection Time: 03/23/14  2:10 AM  Result Value Ref Range   WBC 6.5 4.0 - 10.5 K/uL   RBC 2.75 (L) 3.87 - 5.11 MIL/uL   Hemoglobin 8.2 (L) 12.0 - 15.0 g/dL   HCT 23.8 (L) 36.0 - 46.0 %   MCV 86.5 78.0 - 100.0 fL   MCH 29.8 26.0 - 34.0 pg   MCHC 34.5 30.0 - 36.0 g/dL   RDW 14.9 11.5 - 15.5 %   Platelets 125 (L) 150 - 400 K/uL  Protime-INR     Status: None   Collection Time: 03/23/14  2:10 AM  Result Value Ref Range   Prothrombin Time 14.0 11.6 - 15.2 seconds   INR 1.07 0.00 - 1.49    Studies/Results: No results found.    Assessment: Duodenal ulcers one with some stigmata of hemorrhage status post epinephrine injection. H. pylori antibody pending  Plan: Advance to full liquid diet Monitor stools and hemoglobin. Probably switch to oral PPI today or tomorrow Await H. pylori antibody. Possibly home tomorrow if stable    Leisl Spurrier C 03/23/2014, 10:02 AM

## 2014-03-23 NOTE — Progress Notes (Signed)
[place] TEAM 1 - Stepdown/ICU TEAM Progress Note  [name] EQA:[id number] DOB: 07-17-1924 DOA: 03/22/2014 PCP: [name], MD  Admit HPI / Brief Narrative: 80 yo female s/p Medtronic ppm for symptomatic 2nd degree HB, non obst CAD from cath 04/2004, fibrocystic breast disease, and prior L1 compression fx who came to the [facility] ED 03/22/2014 with a 1 day history of dark stool. She sparingly takes ibuprofen or Aleve once in a while.  She started having diarrhea and felt lightheaded at the time. She did not have any abdominal pain. The stool was mixed with the dark blood.  She later vomited dark brown vomit.  HPI/Subjective: Pt has no new complaints today.  She is tolerating her liquid diet w/o difficulty.  She denies recurrent bleeding or nausea.  No abdom pain.   Assessment/Plan:  GIB - hematemesis and melena > gastric ulcers  S/p cautery via EGD - IV protonix for 48hrs - advance diet as able - H pylori ab pending - possible d/c home in AM   Acute blood loss anemia  Hgb nadir of 8.2 thus far - transfuse as needed for Hgb <7.0  Mild hypokalemia Due to decreased intake - replace and follow   HTN BP well controlled  Chronic microscopic hematuria   Code Status: LIMITED - MEDICATIONS ONLY - NO CPR - NO INTUBATION - NO DEFIB Family Communication: no family present at time of exam Disposition Plan: SDU - probable d/c home in AM   Consultants: GI  Procedures: EGD - 11/27 - Kissing pre-pyloric gastric ulcers, one with visible vessel, treated w/ bicap probe. Ulcers highly likely the source of the patient's hematemesis, melena, anemia. Etiology of the ulcers NSAIDs versus H. pylori.  Antibiotics: none  DVT prophylaxis: SCDs  Objective: Blood pressure 115/52, pulse 77, temperature 98.1 F (36.7 C), temperature source Oral, resp. rate 19, height 5\' 5"  (1.651 m), weight 57.425 kg (126 lb 9.6 oz), SpO2 97 %.  Intake/Output Summary (Last 24 hours) at 03/23/14  1430 Last data filed at 03/23/14 0859  Gross per 24 hour  Intake   1040 ml  Output    350 ml  Net    690 ml   Exam: General: No acute respiratory distress Lungs: Clear to auscultation bilaterally without wheezes or crackles Cardiovascular: Regular rate and rhythm without murmur gallop or rub normal S1 and S2 Abdomen: Nontender, nondistended, soft, bowel sounds positive, no rebound, no ascites, no appreciable mass Extremities: No significant cyanosis, clubbing, or edema bilateral lower extremities  Data Reviewed: Basic Metabolic Panel:  Recent Labs Lab 03/22/14 1028 03/23/14 0210  NA 140 143  K 3.7 3.5*  CL 101 107  CO2 25 27  GLUCOSE 114* 95  BUN 77* 61*  CREATININE 1.06 1.06  CALCIUM 9.8 9.4    Liver Function Tests:  Recent Labs Lab 03/22/14 1028 03/23/14 0210  AST 19 13  ALT 21 14  ALKPHOS 40 30*  BILITOT 0.4 0.4  PROT 5.8* 4.6*  ALBUMIN 3.0* 2.5*   Coags:  Recent Labs Lab 03/22/14 1028 03/23/14 0210  INR 1.04 1.07   CBC:  Recent Labs Lab 03/22/14 1028 03/23/14 0210  WBC 11.8* 6.5  HGB 10.9* 8.2*  HCT 31.9* 23.8*  MCV 88.6 86.5  PLT 162 125*    Recent Results (from the past 240 hour(s))  MRSA PCR Screening     Status: None   Collection Time: 03/22/14  3:12 PM  Result Value Ref Range Status   MRSA by PCR NEGATIVE  NEGATIVE Final    Comment:        The GeneXpert MRSA Assay (FDA approved for NASAL specimens only), is one component of a comprehensive MRSA colonization surveillance program. It is not intended to diagnose MRSA infection nor to guide or monitor treatment for MRSA infections.      Studies:  Recent x-ray studies have been reviewed in detail by the Attending Physician  Scheduled Meds:  Scheduled Meds: . calcium carbonate  1 tablet Oral Q breakfast  . cholecalciferol  1,000 Units Oral Daily  . multivitamin  1 tablet Oral Daily  . omega-3 acid ethyl esters  2 g Oral BID  . [START ON 03/24/2014] pneumococcal 23 valent  vaccine  0.5 mL Intramuscular Tomorrow-1000  . potassium chloride  40 mEq Oral Once    Time spent on care of this patient: 35 mins   Luane Rochon T , MD   [facility] Office  [phone] Pager - Text Page per Shea Evans as per below:  On-Call/Text Page:      [email or web address]      password [email or web address]  If 7PM-7AM, please contact night-coverage [email or web address] Password [email or web address] 03/23/2014, 2:30 PM   LOS: 1 day

## 2014-03-24 DIAGNOSIS — K254 Chronic or unspecified gastric ulcer with hemorrhage: Secondary | ICD-10-CM | POA: Diagnosis not present

## 2014-03-24 LAB — COMPREHENSIVE METABOLIC PANEL
ALT: 11 U/L (ref 0–35)
AST: 12 U/L (ref 0–37)
Albumin: 2.1 g/dL — ABNORMAL LOW (ref 3.5–5.2)
Alkaline Phosphatase: 26 U/L — ABNORMAL LOW (ref 39–117)
Anion gap: 10 (ref 5–15)
BUN: 34 mg/dL — ABNORMAL HIGH (ref 6–23)
CHLORIDE: 109 meq/L (ref 96–112)
CO2: 24 meq/L (ref 19–32)
CREATININE: 0.88 mg/dL (ref 0.50–1.10)
Calcium: 8.9 mg/dL (ref 8.4–10.5)
GFR, EST AFRICAN AMERICAN: 67 mL/min — AB (ref >90)
GFR, EST NON AFRICAN AMERICAN: 57 mL/min — AB (ref >90)
GLUCOSE: 110 mg/dL — AB (ref 70–99)
Potassium: 3.9 mEq/L (ref 3.7–5.3)
Sodium: 143 mEq/L (ref 137–147)
Total Bilirubin: 0.2 mg/dL — ABNORMAL LOW (ref 0.3–1.2)
Total Protein: 4.3 g/dL — ABNORMAL LOW (ref 6.0–8.3)

## 2014-03-24 LAB — CBC
HCT: 20.3 % — ABNORMAL LOW (ref 36.0–46.0)
HCT: 29.9 % — ABNORMAL LOW (ref 36.0–46.0)
Hemoglobin: 7 g/dL — ABNORMAL LOW (ref 12.0–15.0)
Hemoglobin: 10.3 g/dL — ABNORMAL LOW (ref 12.0–15.0)
MCH: 31.1 pg (ref 26.0–34.0)
MCH: 30.7 pg (ref 26.0–34.0)
MCHC: 34.5 g/dL (ref 30.0–36.0)
MCHC: 34.4 g/dL (ref 30.0–36.0)
MCV: 90.2 fL (ref 78.0–100.0)
MCV: 89.3 fL (ref 78.0–100.0)
PLATELETS: 109 10*3/uL — AB (ref 150–400)
PLATELETS: 121 10*3/uL — AB (ref 150–400)
RBC: 2.25 MIL/uL — ABNORMAL LOW (ref 3.87–5.11)
RBC: 3.35 MIL/uL — ABNORMAL LOW (ref 3.87–5.11)
RDW: 15.3 % (ref 11.5–15.5)
RDW: 15 % (ref 11.5–15.5)
WBC: 5.1 10*3/uL (ref 4.0–10.5)
WBC: 7.6 10*3/uL (ref 4.0–10.5)

## 2014-03-24 LAB — PREPARE RBC (CROSSMATCH)

## 2014-03-24 LAB — HEMOGLOBIN AND HEMATOCRIT, BLOOD
HCT: 20 % — ABNORMAL LOW (ref 36.0–46.0)
Hemoglobin: 6.8 g/dL — CL (ref 12.0–15.0)

## 2014-03-24 MED ORDER — SODIUM CHLORIDE 0.9 % IV SOLN: Freq: Once | INTRAVENOUS | Status: DC

## 2014-03-24 MED ORDER — PANTOPRAZOLE SODIUM 40 MG PO TBEC
40.0000 mg | DELAYED_RELEASE_TABLET | Freq: Every day | ORAL | Status: DC
  Administered 2014-03-25: 40 mg via ORAL

## 2014-03-24 NOTE — Progress Notes (Signed)
[place] TEAM 1 - Stepdown/ICU TEAM Progress Note  LIESL SIMONS MOQ:[id number] DOB: 1924-08-11 DOA: 03/22/2014 PCP: [name], MD  Admit HPI / Brief Narrative: 80 yo female s/p Medtronic ppm for HB, non obst CAD from cath 04/2004, fibrocystic breast disease, and prior L1 compression fx who came to the [facility] ED 03/22/2014 with a 1 day history of dark stool. She sparingly takes ibuprofen or Aleve for pain.  She started having diarrhea and felt lightheaded. She did not have any abdominal pain. Her stool was mixed with the dark blood.  She later vomited dark brown vomit.  HPI/Subjective: Pt has no new complaints.  Denies abdom pain, cp, n/v, or sob.    Assessment/Plan:  GIB - hematemesis and melena > gastric ulcers  S/p cautery via EGD - IV protonix for 48hrs - advanced to regular diet - H pylori ab pending   Acute blood loss anemia  Hgb nadir of 6.8 - transfused 2U PRBC today - follow trend - transfuse further as needed for Hgb <7.0  Mild hypokalemia Due to decreased intake - replaced  HTN BP well controlled  Chronic microscopic hematuria   Code Status: LIMITED - MEDICATIONS ONLY - NO CPR - NO INTUBATION - NO DEFIB Family Communication: no family present at time of exam Disposition Plan: SDU   Consultants: GI  Procedures: EGD - 11/27 - Kissing pre-pyloric gastric ulcers, one with visible vessel, treated w/ bicap probe. Ulcers highly likely the source of the patient's hematemesis, melena, anemia. Etiology of the ulcers NSAIDs versus H. pylori.  Antibiotics: none  DVT prophylaxis: SCDs  Objective: Blood pressure 138/59, pulse 86, temperature 97.8 F (36.6 C), temperature source Oral, resp. rate 21, height 5\' 5"  (1.651 m), weight 61.009 kg (134 lb 8 oz), SpO2 97 %.  Intake/Output Summary (Last 24 hours) at 03/24/14 1432 Last data filed at 03/24/14 1111  Gross per 24 hour  Intake   1355 ml  Output    525 ml  Net    830 ml   Exam: General: No acute  respiratory distress Lungs: Clear to auscultation bilaterally without wheezes or crackles Cardiovascular: Regular rate and rhythm without murmur gallop or rub normal S1 and S2 Abdomen: Nontender, nondistended, soft, bowel sounds positive, no rebound, no ascites, no appreciable mass Extremities: No significant cyanosis, clubbing, or edema bilateral lower extremities  Data Reviewed: Basic Metabolic Panel:  Recent Labs Lab 03/22/14 1028 03/23/14 0210 03/24/14 0229  NA 140 143 143  K 3.7 3.5* 3.9  CL 101 107 109  CO2 25 27 24   GLUCOSE 114* 95 110*  BUN 77* 61* 34*  CREATININE 1.06 1.06 0.88  CALCIUM 9.8 9.4 8.9    Liver Function Tests:  Recent Labs Lab 03/22/14 1028 03/23/14 0210 03/24/14 0229  AST 19 13 12   ALT 21 14 11   ALKPHOS 40 30* 26*  BILITOT 0.4 0.4 0.2*  PROT 5.8* 4.6* 4.3*  ALBUMIN 3.0* 2.5* 2.1*   Coags:  Recent Labs Lab 03/22/14 1028 03/23/14 0210  INR 1.04 1.07   CBC:  Recent Labs Lab 03/22/14 1028 03/23/14 0210 03/23/14 1930 03/24/14 0229 03/24/14 0535  WBC 11.8* 6.5 5.7 5.1  --   HGB 10.9* 8.2* 7.1* 7.0* 6.8*  HCT 31.9* 23.8* 21.0* 20.3* 20.0*  MCV 88.6 86.5 89.4 90.2  --   PLT 162 125* 108* 109*  --     Recent Results (from the past 240 hour(s))  MRSA PCR Screening     Status: None   Collection  Time: 03/22/14  3:12 PM  Result Value Ref Range Status   MRSA by PCR NEGATIVE NEGATIVE Final    Comment:        The GeneXpert MRSA Assay (FDA approved for NASAL specimens only), is one component of a comprehensive MRSA colonization surveillance program. It is not intended to diagnose MRSA infection nor to guide or monitor treatment for MRSA infections.      Studies:  Recent x-ray studies have been reviewed in detail by the Attending Physician  Scheduled Meds:  Scheduled Meds: . sodium chloride   Intravenous Once  . calcium carbonate  1 tablet Oral Q breakfast  . cholecalciferol  1,000 Units Oral Daily  . multivitamin  1  tablet Oral Daily  . omega-3 acid ethyl esters  2 g Oral BID  . pneumococcal 23 valent vaccine  0.5 mL Intramuscular Tomorrow-1000    Time spent on care of this patient: 35 mins   Railyn House T , MD   [facility] Office  [phone] Pager - Text Page per Shea Evans as per below:  On-Call/Text Page:      [email or web address]      password [email or web address]  If 7PM-7AM, please contact night-coverage [email or web address] Password [email or web address] 03/24/2014, 2:32 PM   LOS: 2 days

## 2014-03-24 NOTE — Progress Notes (Signed)
Notified Lamar Blinks, NP re: Hgb results last. New order for H&H at 0600.

## 2014-03-24 NOTE — Progress Notes (Signed)
[facility] Progress Note  Subjective: No stool since yesterday yet hemoglobin drop from 76.8, currently receiving a blood transfusion. Tolerating diet    Objective: Vital signs in last 24 hours: Temp:  [97.4 F (36.3 C)-98.6 F (37 C)] 98.2 F (36.8 C) (11/29 1128) Pulse Rate:  [82-107] 82 (11/29 1058) Resp:  [16-26] 16 (11/29 1058) BP: (103-132)/(40-91) 125/52 mmHg (11/29 1128) SpO2:  [95 %-99 %] 95 % (11/29 1058) Weight:  [61.009 kg (134 lb 8 oz)] 61.009 kg (134 lb 8 oz) (11/29 0402) Weight change: 3.856 kg (8 lb 8 oz)   PE: Abdomen soft nondistended with normoactive bowel sounds. No hepatomegaly masses or guarding  Lab Results: Results for orders placed or performed during the hospital encounter of 03/22/14 (from the past 24 hour(s))  CBC     Status: Abnormal   Collection Time: 03/23/14  7:30 PM  Result Value Ref Range   WBC 5.7 4.0 - 10.5 K/uL   RBC 2.35 (L) 3.87 - 5.11 MIL/uL   Hemoglobin 7.1 (L) 12.0 - 15.0 g/dL   HCT 21.0 (L) 36.0 - 46.0 %   MCV 89.4 78.0 - 100.0 fL   MCH 33.8 26.0 - 34.0 pg   MCHC 33.8 30.0 - 36.0 g/dL   RDW 49.6 (H) 11.5 - 15.5 %   Platelets 108 (L) 150 - 400 K/uL  Comprehensive metabolic panel     Status: Abnormal   Collection Time: 03/24/14  2:29 AM  Result Value Ref Range   Sodium 143 137 - 147 mEq/L   Potassium 3.9 3.7 - 5.3 mEq/L   Chloride 109 96 - 112 mEq/L   CO2 24 19 - 32 mEq/L   Glucose, Bld 110 (H) 70 - 99 mg/dL   BUN 34 (H) 6 - 23 mg/dL   Creatinine, Ser 0.88 0.50 - 1.10 mg/dL   Calcium 8.9 8.4 - 10.5 mg/dL   Total Protein 4.3 (L) 6.0 - 8.3 g/dL   Albumin 2.1 (L) 3.5 - 5.2 g/dL   AST 12 0 - 37 U/L   ALT 11 0 - 35 U/L   Alkaline Phosphatase 26 (L) 39 - 117 U/L   Total Bilirubin 0.2 (L) 0.3 - 1.2 mg/dL   GFR calc non Af Amer 57 (L) >90 mL/min   GFR calc Af Amer 67 (L) >90 mL/min   Anion gap 10 5 - 15  CBC     Status: Abnormal   Collection Time: 03/24/14  2:29 AM  Result Value Ref Range   WBC 5.1 4.0 - 10.5 K/uL    RBC 2.25 (L) 3.87 - 5.11 MIL/uL   Hemoglobin 7.0 (L) 12.0 - 15.0 g/dL   HCT 20.3 (L) 36.0 - 46.0 %   MCV 90.2 78.0 - 100.0 fL   MCH 31.1 26.0 - 34.0 pg   MCHC 34.5 30.0 - 36.0 g/dL   RDW 15.3 11.5 - 15.5 %   Platelets 109 (L) 150 - 400 K/uL  Hemoglobin and hematocrit, blood     Status: Abnormal   Collection Time: 03/24/14  5:35 AM  Result Value Ref Range   Hemoglobin 6.8 (LL) 12.0 - 15.0 g/dL   HCT 20.0 (L) 36.0 - 46.0 %  Prepare RBC     Status: None   Collection Time: 03/24/14  6:35 AM  Result Value Ref Range   Order Confirmation ORDER PROCESSED BY BLOOD BANK   Prepare RBC     Status: None   Collection Time: 03/24/14  8:13 AM  Result Value Ref  Range   Order Confirmation ORDER PROCESSED BY BLOOD BANK     Studies/Results: No results found.    Assessment: Duodenal ulcer with bleed, hemoglobin is still low but no stools today  Plan: Agree with transfusion and monitor stools and hemoglobin. Await H. pylori antibody. Hopefully discharge in a day or 2 unless evidence of reactivated bleeding    Kaleiah Kutzer C 03/24/2014, 12:14 PM

## 2014-03-25 ENCOUNTER — Encounter (HOSPITAL_COMMUNITY): Payer: Self-pay | Admitting: Gastroenterology

## 2014-03-25 LAB — TYPE AND SCREEN
ABO/RH(D): A POS
Antibody Screen: NEGATIVE
UNIT DIVISION: 0
Unit division: 0

## 2014-03-25 LAB — CBC
HEMATOCRIT: 29.2 % — AB (ref 36.0–46.0)
Hemoglobin: 10.2 g/dL — ABNORMAL LOW (ref 12.0–15.0)
MCH: 31.7 pg (ref 26.0–34.0)
MCHC: 34.9 g/dL (ref 30.0–36.0)
MCV: 90.7 fL (ref 78.0–100.0)
Platelets: 125 10*3/uL — ABNORMAL LOW (ref 150–400)
RBC: 3.22 MIL/uL — ABNORMAL LOW (ref 3.87–5.11)
RDW: 14.9 % (ref 11.5–15.5)
WBC: 7.1 10*3/uL (ref 4.0–10.5)

## 2014-03-25 LAB — COMPREHENSIVE METABOLIC PANEL
ALBUMIN: 2.3 g/dL — AB (ref 3.5–5.2)
ALT: 13 U/L (ref 0–35)
AST: 14 U/L (ref 0–37)
Alkaline Phosphatase: 31 U/L — ABNORMAL LOW (ref 39–117)
Anion gap: 9 (ref 5–15)
BUN: 28 mg/dL — ABNORMAL HIGH (ref 6–23)
CO2: 25 mEq/L (ref 19–32)
CREATININE: 0.86 mg/dL (ref 0.50–1.10)
Calcium: 9.1 mg/dL (ref 8.4–10.5)
Chloride: 108 mEq/L (ref 96–112)
GFR calc Af Amer: 68 mL/min — ABNORMAL LOW (ref >90)
GFR calc non Af Amer: 59 mL/min — ABNORMAL LOW (ref >90)
Glucose, Bld: 119 mg/dL — ABNORMAL HIGH (ref 70–99)
Potassium: 3.8 mEq/L (ref 3.7–5.3)
Sodium: 142 mEq/L (ref 137–147)
Total Bilirubin: 0.5 mg/dL (ref 0.3–1.2)
Total Protein: 4.9 g/dL — ABNORMAL LOW (ref 6.0–8.3)

## 2014-03-25 LAB — HELICOBACTER PYLORI ABS-IGG+IGA, BLD
H Pylori IgA: 22 U/mL — ABNORMAL HIGH (ref <9.0)
H Pylori IgG: 2.35 {ISR} — ABNORMAL HIGH

## 2014-03-25 MED ORDER — ACETAMINOPHEN 325 MG PO TABS
650.0000 mg | ORAL_TABLET | Freq: Four times a day (QID) | ORAL | Status: DC | PRN
  Administered 2014-03-25 (×2): 650 mg via ORAL
  Filled 2014-03-25 (×2): qty 2

## 2014-03-25 MED ORDER — SACCHAROMYCES BOULARDII 250 MG PO CAPS
250.0000 mg | ORAL_CAPSULE | Freq: Two times a day (BID) | ORAL | Status: DC
  Filled 2014-03-25: qty 1

## 2014-03-25 MED ORDER — PANTOPRAZOLE SODIUM 40 MG PO TBEC
40.0000 mg | DELAYED_RELEASE_TABLET | Freq: Two times a day (BID) | ORAL | Status: DC
Start: 2014-03-26 — End: 2014-03-25

## 2014-03-25 MED ORDER — AMLODIPINE BESYLATE 10 MG PO TABS
10.0000 mg | ORAL_TABLET | Freq: Every day | ORAL | Status: DC
  Administered 2014-03-25: 10 mg via ORAL
  Filled 2014-03-25: qty 1

## 2014-03-25 MED ORDER — CLARITHROMYCIN 500 MG PO TABS
500.0000 mg | ORAL_TABLET | Freq: Two times a day (BID) | ORAL | Status: DC
  Filled 2014-03-25 (×2): qty 1

## 2014-03-25 MED ORDER — SACCHAROMYCES BOULARDII 250 MG PO CAPS: 250.0000 mg | ORAL_CAPSULE | Freq: Two times a day (BID) | ORAL | Status: DC

## 2014-03-25 MED ORDER — AMOXICILLIN 500 MG PO CAPS
1000.0000 mg | ORAL_CAPSULE | Freq: Two times a day (BID) | ORAL | Status: DC
  Filled 2014-03-25 (×2): qty 2

## 2014-03-25 MED ORDER — TRIAMTERENE-HCTZ 37.5-25 MG PO TABS
0.5000 | ORAL_TABLET | Freq: Every day | ORAL | Status: DC
  Administered 2014-03-25: 0.5 via ORAL
  Filled 2014-03-25: qty 0.5

## 2014-03-25 MED ORDER — AMOXICILLIN 500 MG PO CAPS: 1000.0000 mg | ORAL_CAPSULE | Freq: Two times a day (BID) | ORAL | Status: DC

## 2014-03-25 MED ORDER — PANTOPRAZOLE SODIUM 40 MG PO TBEC: 40.0000 mg | DELAYED_RELEASE_TABLET | Freq: Two times a day (BID) | ORAL | Status: DC

## 2014-03-25 MED ORDER — CLARITHROMYCIN 500 MG PO TABS: 500.0000 mg | ORAL_TABLET | Freq: Two times a day (BID) | ORAL | Status: DC

## 2014-03-25 NOTE — Evaluation (Signed)
Occupational Therapy Evaluation Patient Details Name: [name] MRN: [id number] DOB: 09-16-1924 Today's Date: 03/25/2014    History of Present Illness this 80 y.o. female admitted with one week h/o diarrhea, feeling light headed dark stool as well as vomitting dark brown emesis.  Pt diagnosed with GIB due to gastric ulcers.  PMH:  CAD; fibrocystic breas t disease and prior L1 compression fx.    Clinical Impression   Pt admitted with above. She demonstrates the below listed deficits and will benefit from continued OT to maximize safety and independence with BADLs.  Pt appears to be very close to her baseline.  She requires supervision for BADLs, and anticipate she will quickly advance to independent level.  Family is supportive.  No futher OT needs identified.  Will sign off.       Follow Up Recommendations  No OT follow up;Supervision - Intermittent    Equipment Recommendations  None recommended by OT    Recommendations for Other Services       Precautions / Restrictions        Mobility Bed Mobility Overal bed mobility: Independent                Transfers Overall transfer level: Modified independent                    Balance Overall balance assessment: No apparent balance deficits (not formally assessed)                                          ADL Overall ADL's : Needs assistance/impaired Eating/Feeding: Independent   Grooming: Wash/dry hands;Wash/dry face;Oral care;Brushing hair;Supervision/safety;Standing   Upper Body Bathing: Set up;Sitting   Lower Body Bathing: Supervison/ safety;Sit to/from stand   Upper Body Dressing : Set up;Sitting   Lower Body Dressing: Supervision/safety;Sit to/from stand   Toilet Transfer: Supervision/safety;Ambulation;Comfort height toilet   Toileting- Clothing Manipulation and Hygiene: Supervision/safety;Sit to/from stand   Tub/ Shower Transfer: Walk-in  shower;Supervision/safety;Ambulation;Shower seat   Functional mobility during ADLs: Supervision/safety General ADL Comments: Pt initially with complaint of dizziness/lightheadedness when standing.   167/78.     Vision                     Perception     Praxis      Pertinent Vitals/Pain Pain Assessment: No/denies pain     Hand Dominance Right   Extremity/Trunk Assessment Upper Extremity Assessment Upper Extremity Assessment: Overall WFL for tasks assessed   Lower Extremity Assessment Lower Extremity Assessment: Defer to PT evaluation       Communication Communication Communication: HOH   Cognition Arousal/Alertness: Awake/alert Behavior During Therapy: WFL for tasks assessed/performed Overall Cognitive Status: Within Functional Limits for tasks assessed       Memory: Decreased short-term memory (Pt appears with questionable  mild memory deficits)             General Comments       Exercises       Shoulder Instructions      Home Living Family/patient expects to be discharged to:: Private residence Living Arrangements: Alone Available Help at Discharge: Family;Available PRN/intermittently Type of Home: House Home Access: Stairs to enter [employer] of Steps: 1   Home Layout: One level     Bathroom Shower/Tub: Occupational psychologist: Standard     Home Equipment: Environmental consultant - 2 wheels;Cane -  single point          Prior Functioning/Environment Level of Independence: Independent        Comments: Pt drives, does her own yard work including push mowing.  She does endorse ~4 falls/year.  In speaking with pt and daughter they both report that falls occur when pt is outside doing yard work and becomes fatigued.     OT Diagnosis: Generalized weakness   OT Problem List:     OT Treatment/Interventions:      OT Goals(Current goals can be found in the care plan section) Acute Rehab OT Goals OT Goal Formulation: All  assessment and education complete, DC therapy  OT Frequency:     Barriers to D/C:            Co-evaluation              End of Session Nurse Communication: Mobility status  Activity Tolerance: Patient tolerated treatment well Patient left: in chair;with call bell/phone within reach;with family/visitor present   Time: 4210-3128 OT Time Calculation (min): 26 min Charges:  OT General Charges $OT Visit: 1 Procedure OT Evaluation $Initial OT Evaluation Tier I: 1 Procedure OT Treatments $Self Care/Home Management : 8-22 mins G-Codes:    Giavanni Odonovan M 03-30-2014, 11:35 AM

## 2014-03-25 NOTE — Discharge Summary (Signed)
DISCHARGE SUMMARY  [name]  MR#: [id number]  DOB:1924-11-08  Date of Admission: 03/22/2014 Date of Discharge: 03/25/2014  Attending Physician:MCCLUNG,JEFFREY T  Patient's [name]  Consults:  GI - Dr. Paulita Fujita  Disposition: d/c home   Follow-up Appts:     Follow-up Information    Follow up with [name]. Schedule an appointment as soon as possible for a visit in 1 week.   Specialty:  Internal Medicine   Contact information:   [address] [place] [address] [phone]      Tests Needing Follow-up: -repeat CBC is suggested in f/u to assure Hgb stable/climbing   Discharge Diagnoses: GIB - hematemesis and melena > gastric ulcers & H pylori + Acute blood loss anemia  Mild hypokalemia HTN Chronic microscopic hematuria   Initial presentation: 80 yo female s/p Medtronic ppm for HB, non obst CAD from cath 04/2004, fibrocystic breast disease, and prior L1 compression fx who came to the [facility] ED 03/22/2014 with a 1 day history of dark stool. She sparingly takes ibuprofen or Aleve for pain. She started having diarrhea and felt lightheaded. She did not have any abdominal pain. Her stool was mixed with the dark blood. She later vomited dark brown vomit.  Hospital Course:  GIB - hematemesis and melena > gastric ulcers  S/p cautery via EGD - IV protonix for 48hrs - advanced to regular diet - H pylori ab + so will initiated tx course at time of d/c   Acute blood loss anemia  Hgb nadir of 6.8 - transfused 2U PRBC - followed trend - Hb stable at ~10 at time of d/c   Mild hypokalemia Due to decreased intake - replaced  HTN BP well controlled  Chronic microscopic hematuria     Medication List    TAKE these medications        amLODipine 10 MG tablet  Commonly known as:  NORVASC  Take 1 tablet (10 mg total) by mouth daily.     amoxicillin 500 MG capsule  Commonly known as:  AMOXIL  Take 2 capsules  (1,000 mg total) by mouth every 12 (twelve) hours.     aspirin 81 MG tablet  Take 81 mg by mouth daily.     calcium carbonate 600 MG Tabs tablet  Commonly known as:  OS-CAL  Take 600 mg by mouth daily.     clarithromycin 500 MG tablet  Commonly known as:  BIAXIN  Take 1 tablet (500 mg total) by mouth every 12 (twelve) hours.     fish oil-omega-3 fatty acids 1000 MG capsule  Take 2 g by mouth 2 (two) times daily.     HYDROcodone-acetaminophen 5-325 MG per tablet  Commonly known as:  NORCO/VICODIN  Take 1 tablet by mouth every 6 (six) hours as needed for severe pain.     multivitamin tablet  Take 1 tablet by mouth daily.     pantoprazole 40 MG tablet  Commonly known as:  PROTONIX  Take 1 tablet (40 mg total) by mouth 2 (two) times daily with breakfast and lunch.  Start taking on:  03/26/2014     saccharomyces boulardii 250 MG capsule  Commonly known as:  FLORASTOR  Take 1 capsule (250 mg total) by mouth 2 (two) times daily.     TIGER BALM REGULAR STRENGTH 11-8 % Oint  Apply topically as directed.     triamterene-hydrochlorothiazide 37.5-25 MG per tablet  Commonly known as:  MAXZIDE-25  Take 0.5 tablets by  mouth daily.     VITAMIN B-12 CR PO  Take 1 tablet by mouth daily.     Vitamin D3 2000 UNITS Tabs  Take 1 tablet by mouth daily.        Day of Discharge BP 160/71 mmHg  Pulse 91  Temp(Src) 97.4 F (36.3 C) (Oral)  Resp 16  Ht 5\' 4"  (1.626 m)  Wt 58.786 kg (129 lb 9.6 oz)  BMI 22.23 kg/m2  SpO2 99%  Physical Exam: General: No acute respiratory distress Lungs: Clear to auscultation bilaterally without wheezes or crackles Cardiovascular: Regular rate and rhythm without murmur gallop or rub normal S1 and S2 Abdomen: Nontender, nondistended, soft, bowel sounds positive, no rebound, no ascites, no appreciable mass Extremities: No significant cyanosis, clubbing, or edema bilateral lower extremities  Basic Metabolic Panel:  Recent Labs Lab 03/22/14 1028  03/23/14 0210 03/24/14 0229 03/25/14 0240  NA 140 143 143 142  K 3.7 3.5* 3.9 3.8  CL 101 107 109 108  CO2 25 27 24 25   GLUCOSE 114* 95 110* 119*  BUN 77* 61* 34* 28*  CREATININE 1.06 1.06 0.88 0.86  CALCIUM 9.8 9.4 8.9 9.1    Liver Function Tests:  Recent Labs Lab 03/22/14 1028 03/23/14 0210 03/24/14 0229 03/25/14 0240  AST 19 13 12 14   ALT 21 14 11 13   ALKPHOS 40 30* 26* 31*  BILITOT 0.4 0.4 0.2* 0.5  PROT 5.8* 4.6* 4.3* 4.9*  ALBUMIN 3.0* 2.5* 2.1* 2.3*   Coags:  Recent Labs Lab 03/22/14 1028 03/23/14 0210  INR 1.04 1.07   CBC:  Recent Labs Lab 03/23/14 0210 03/23/14 1930 03/24/14 0229 03/24/14 0535 03/24/14 1802 03/25/14 0240  WBC 6.5 5.7 5.1  --  7.6 7.1  HGB 8.2* 7.1* 7.0* 6.8* 10.3* 10.2*  HCT 23.8* 21.0* 20.3* 20.0* 29.9* 29.2*  MCV 86.5 89.4 90.2  --  89.3 90.7  PLT 125* 108* 109*  --  121* 125*    Recent Results (from the past 240 hour(s))  MRSA PCR Screening     Status: None   Collection Time: 03/22/14  3:12 PM  Result Value Ref Range Status   MRSA by PCR NEGATIVE NEGATIVE Final    Comment:        The GeneXpert MRSA Assay (FDA approved for NASAL specimens only), is one component of a comprehensive MRSA colonization surveillance program. It is not intended to diagnose MRSA infection nor to guide or monitor treatment for MRSA infections.       Time spent in discharge (includes decision making & examination of pt): >30 minutes  03/25/2014, 3:18 PM   Cherene Altes, Riddle [facility] Office  [phone] Pager [phone]  On-Call/Text Page:      [email or web address]      password [facility]

## 2014-03-25 NOTE — Progress Notes (Signed)
Utilization Review Completed.  

## 2014-03-25 NOTE — Discharge Instructions (Signed)

## 2014-03-25 NOTE — Evaluation (Signed)
Physical Therapy Evaluation Patient Details Name: [name] MRN: [id number] DOB: Sep 10, 1924 Today's Date: 03/25/2014   History of Present Illness  this 80 y.o. female admitted with one week h/o diarrhea, feeling light headed dark stool as well as vomitting dark brown emesis.  Pt diagnosed with GIB due to gastric ulcers.  PMH:  CAD; fibrocystic breas t disease and prior L1 compression fx.   Clinical Impression  Patient presents with decreased independence with mobility due to deficits listed in PT problem list.  A little wobbly today (first day up on her feet since hospitalized, and without her normal footwear,) demonstrates high fall risk per Merrilee Jansky balance assessment.  Feel continued acute level rehab will assist in safer ambulation with training with cane, and able to practice with her normal footwear.  Has current HEP from [place] when she had a stroke.  Patient plans to use to work on balance and specific strengthening once home.  Encouraged 1-2x daily as well as walking for exercise, but knowing her limits.  Encouraged to seek referral for outpatient PT for balance from PCP if not better after using current HEP.    Follow Up Recommendations No PT follow up    Equipment Recommendations  Cane    Recommendations for Other Services       Precautions / Restrictions Precautions Precautions: Fall Precaution Comments: has had about one fall outside every three of months ususally when fatigued from yardwork      Mobility  Bed Mobility Overal bed mobility: Independent             General bed mobility comments: up in chair  Transfers Overall transfer level: Modified independent Equipment used: None             General transfer comment: requires UE support for sit < stand  Ambulation/Gait Ambulation/Gait assistance: Supervision Ambulation Distance (Feet): 200 Feet Assistive device: None Gait Pattern/deviations: Step-through pattern;Decreased stride length;Shuffle      General Gait Details: reports has high arches and wears inserts in shoes for arch support which helps her walk better; today is first day up since she came in; veers right with right head turns; able to turn and stop without loss of balance with increased time, able to do vertical head turns without change in balance, difficult to change walking speeds, but no loss of balance   Stairs            Wheelchair Mobility    Modified Rankin (Stroke Patients Only)       Balance Overall balance assessment: Needs assistance           Standing balance-Leahy Scale: Good                   Standardized Balance Assessment Standardized Balance Assessment : Berg Balance Test Berg Balance Test Sit to Stand: Able to stand  independently using hands Standing Unsupported: Able to stand safely 2 minutes Sitting with Back Unsupported but Feet Supported on Floor or Stool: Able to sit safely and securely 2 minutes Stand to Sit: Controls descent by using hands Transfers: Able to transfer safely, definite need of hands Standing Unsupported with Eyes Closed: Able to stand 10 seconds safely Standing Ubsupported with Feet Together: Needs help to attain position but able to stand for 30 seconds with feet together From Standing, Reach Forward with Outstretched Arm: Can reach forward >12 cm safely (5") From Standing Position, Pick up Object from Floor: Able to pick up shoe safely and easily From Standing  Position, Turn to Look Behind Over each Shoulder: Looks behind one side only/other side shows less weight shift Turn 360 Degrees: Able to turn 360 degrees safely but slowly Standing Unsupported, Alternately Place Feet on Step/Stool: Able to complete >2 steps/needs minimal assist Standing Unsupported, One Foot in Front: Able to take small step independently and hold 30 seconds Standing on One Leg: Tries to lift leg/unable to hold 3 seconds but remains standing independently Total Score: 38          Pertinent Vitals/Pain Pain Assessment: No/denies pain    Home Living Family/patient expects to be discharged to:: Private residence Living Arrangements: Alone Available Help at Discharge: Family;Available PRN/intermittently Type of Home: House Home Access: Stairs to enter   Entrance Stairs-Number of Steps: 1 Home Layout: One level Home Equipment: Walker - 2 wheels;Cane - single point      Prior Function Level of Independence: Independent         Comments: Pt drives, does her own yard work including push mowing.       Hand Dominance   Dominant Hand: Right    Extremity/Trunk Assessment   Upper Extremity Assessment: Overall WFL for tasks assessed           Lower Extremity Assessment: RLE deficits/detail;LLE deficits/detail RLE Deficits / Details: AROM WFL, strength hip flexion 3/5, knee extension 4/5, ankle DF 4/5 LLE Deficits / Details: AROM WFL, strength hip flexion 3+/5, knee extension 4+/5, ankle DF 4+/5     Communication   Communication: HOH  Cognition Arousal/Alertness: Awake/alert Behavior During Therapy: WFL for tasks assessed/performed Overall Cognitive Status: Within Functional Limits for tasks assessed       Memory: Decreased short-term memory              General Comments General comments (skin integrity, edema, etc.): educated in use of cane for outings due to fall risk and difficulty with balance on one foot for curb negotiation; at home uses gate to step up on step into home.  Patient relates has HEP still from when had HHPT after stroke and plans to use daily for work on balance/ specific LE strength    Exercises        Assessment/Plan    PT Assessment Patient needs continued PT services  PT Diagnosis Abnormality of gait;Generalized weakness   PT Problem List Decreased strength;Decreased activity tolerance;Decreased knowledge of use of DME;Decreased balance;Decreased safety awareness;Decreased mobility  PT Treatment  Interventions DME instruction;Balance training;Gait training;Functional mobility training;Patient/family education;Therapeutic activities;Therapeutic exercise;Stair training   PT Goals (Current goals can be found in the Care Plan section) Acute Rehab PT Goals Patient Stated Goal: To go home today PT Goal Formulation: With patient/family Time For Goal Achievement: 04/01/14 Potential to Achieve Goals: Good    Frequency Min 3X/week   Barriers to discharge        Co-evaluation               End of Session Equipment Utilized During Treatment: Gait belt Activity Tolerance: Patient tolerated treatment well Patient left: in chair;with call bell/phone within reach;with family/visitor present           Time: 1150-1216 PT Time Calculation (min) (ACUTE ONLY): 26 min   Charges:   PT Evaluation $Initial PT Evaluation Tier I: 1 Procedure PT Treatments $Gait Training: 8-22 mins   PT G Codes:          Trini Christiansen,CYNDI 2014-04-03, 12:26 PM Magda Kiel, [place] 04-03-2014

## 2014-03-26 NOTE — Care Management Note (Signed)
    Page 1 of 1   03/26/2014     9:12:57 AM CARE MANAGEMENT NOTE 03/26/2014  Patient:  [name], [name]   Account Number:  [id number]  Date Initiated:  03/26/2014  Documentation initiated by:  Krystol Rocco  Subjective/Objective Assessment:   dx GI Bleed; lives alone, dtr very suppotive     Anticipated DC Date:  03/25/2014   Anticipated DC Plan:  [place]  CM consult       Status of service:  Completed, signed off Medicare Important Message given?  YES (If response is "NO", the following Medicare IM given date fields will be blank) Date Medicare IM given:  03/25/2014 Medicare IM given by:  Leston Schueller Date Additional Medicare IM given:   Additional Medicare IM given by:    Per UR Regulation:  Reviewed for med. necessity/level of care/duration of stay

## 2014-04-25 DIAGNOSIS — K259 Gastric ulcer, unspecified as acute or chronic, without hemorrhage or perforation: Secondary | ICD-10-CM | POA: Diagnosis not present

## 2014-04-25 DIAGNOSIS — M549 Dorsalgia, unspecified: Secondary | ICD-10-CM | POA: Diagnosis not present

## 2014-04-25 DIAGNOSIS — B9681 Helicobacter pylori [H. pylori] as the cause of diseases classified elsewhere: Secondary | ICD-10-CM | POA: Diagnosis not present

## 2014-05-06 DIAGNOSIS — K922 Gastrointestinal hemorrhage, unspecified: Secondary | ICD-10-CM | POA: Diagnosis not present

## 2014-05-06 DIAGNOSIS — K259 Gastric ulcer, unspecified as acute or chronic, without hemorrhage or perforation: Secondary | ICD-10-CM | POA: Diagnosis not present

## 2014-05-06 DIAGNOSIS — B9681 Helicobacter pylori [H. pylori] as the cause of diseases classified elsewhere: Secondary | ICD-10-CM | POA: Diagnosis not present

## 2014-05-13 DIAGNOSIS — R300 Dysuria: Secondary | ICD-10-CM | POA: Diagnosis not present

## 2014-05-21 ENCOUNTER — Encounter: Payer: Medicare Other | Admitting: Internal Medicine

## 2014-06-12 ENCOUNTER — Encounter: Payer: Self-pay | Admitting: Internal Medicine

## 2014-06-12 ENCOUNTER — Ambulatory Visit (INDEPENDENT_AMBULATORY_CARE_PROVIDER_SITE_OTHER): Payer: Medicare Other | Admitting: Internal Medicine

## 2014-06-12 VITALS — BP 137/78 | HR 85 | Ht 64.5 in | Wt 127.0 lb

## 2014-06-12 DIAGNOSIS — Z45018 Encounter for adjustment and management of other part of cardiac pacemaker: Secondary | ICD-10-CM

## 2014-06-12 DIAGNOSIS — I2510 Atherosclerotic heart disease of native coronary artery without angina pectoris: Secondary | ICD-10-CM | POA: Diagnosis not present

## 2014-06-12 DIAGNOSIS — Z950 Presence of cardiac pacemaker: Secondary | ICD-10-CM | POA: Diagnosis not present

## 2014-06-12 DIAGNOSIS — I442 Atrioventricular block, complete: Secondary | ICD-10-CM

## 2014-06-12 DIAGNOSIS — I10 Essential (primary) hypertension: Secondary | ICD-10-CM

## 2014-06-12 LAB — MDC_IDC_ENUM_SESS_TYPE_INCLINIC
Battery Remaining Longevity: 23 mo
Battery Voltage: 2.71 V
Brady Statistic AP VP Percent: 9 %
Brady Statistic AS VP Percent: 91 %
Brady Statistic AS VS Percent: 1 %
Date Time Interrogation Session: 20160217155434
Lead Channel Impedance Value: 483 Ohm
Lead Channel Impedance Value: 697 Ohm
Lead Channel Pacing Threshold Amplitude: 0.5 V
Lead Channel Pacing Threshold Amplitude: 0.75 V
Lead Channel Pacing Threshold Pulse Width: 0.4 ms
Lead Channel Pacing Threshold Pulse Width: 0.4 ms
Lead Channel Sensing Intrinsic Amplitude: 4 mV
Lead Channel Setting Pacing Amplitude: 1.5 V
Lead Channel Setting Sensing Sensitivity: 2 mV
MDC IDC MSMT BATTERY IMPEDANCE: 2193 Ohm
MDC IDC SET LEADCHNL RV PACING AMPLITUDE: 2 V
MDC IDC SET LEADCHNL RV PACING PULSEWIDTH: 0.4 ms
MDC IDC STAT BRADY AP VS PERCENT: 0 %

## 2014-06-12 NOTE — Patient Instructions (Signed)
Your physician recommends that you continue on your current medications as directed. Please refer to the Current Medication list given to you today.  Remote monitoring is used to monitor your Pacemaker of ICD from home. This monitoring reduces the number of office visits required to check your device to one time per year. It allows [place] to keep an eye on the functioning of your device to ensure it is working properly. You are scheduled for a device check from home on 09/11/14. You may send your transmission at any time that day. If you have a wireless device, the transmission will be sent automatically. After your physician reviews your transmission, you will receive a postcard with your next transmission date.  Your physician wants you to follow-up in: 1 year with Dr. Lovena Le.  You will receive a reminder letter in the mail two months in advance. If you don't receive a letter, please call our office to schedule the follow-up appointment.

## 2014-06-12 NOTE — Assessment & Plan Note (Signed)
Her blood pressure is fairly well controlled. No change in medical therapy.

## 2014-06-12 NOTE — Progress Notes (Signed)
HPI [name] is referred today for ongoing evaluation and management of her PPM. She is a pleasant 81 yo woman, with a h/o CHB, s/p PPM insertion and HTN. The patient denies anginal symptoms. No sob or syncope. Minimal peripheral edema. In the interim, she has had a GI bleed. She is no longer on systemic anti-coagulation. Allergies  Allergen Reactions  . Clindamycin/Lincomycin Rash  . Antihistamines, Chlorpheniramine-Type     nightmares  . Ativan [Lorazepam]     Possible: agitation  . Imiquimod   . Micardis Hct [Telmisartan-Hctz]     fatigue  . Thiazide-Type Diuretics     Possible photosensitivity  . Ultracet [Tramadol-Acetaminophen]     nightmares     Current Outpatient Prescriptions  Medication Sig Dispense Refill  . amLODipine (NORVASC) 10 MG tablet Take 1 tablet (10 mg total) by mouth daily. 90 tablet 3  . aspirin 81 MG tablet Take 81 mg by mouth daily.    . calcium carbonate (OS-CAL) 600 MG TABS tablet Take 600 mg by mouth daily.    . Camphor-Menthol (TIGER BALM REGULAR STRENGTH) 11-8 % OINT Apply topically as directed.    . Cholecalciferol (VITAMIN D3) 2000 UNITS TABS Take 1 tablet by mouth daily.     . Cyanocobalamin (VITAMIN B-12 CR PO) Take 1 tablet by mouth daily.     . fish oil-omega-3 fatty acids 1000 MG capsule Take 2 g by mouth 2 (two) times daily.    Marland Kitchen HYDROcodone-acetaminophen (NORCO/VICODIN) 5-325 MG per tablet Take 1 tablet by mouth every 6 (six) hours as needed for severe pain.     . Multiple Vitamin (MULTIVITAMIN) tablet Take 1 tablet by mouth daily.    . pantoprazole (PROTONIX) 40 MG tablet Take 1 tablet (40 mg total) by mouth 2 (two) times daily with breakfast and lunch. 60 tablet 0  . saccharomyces boulardii (FLORASTOR) 250 MG capsule Take 1 capsule (250 mg total) by mouth 2 (two) times daily. 20 capsule 0  . triamterene-hydrochlorothiazide (MAXZIDE-25) 37.5-25 MG per tablet Take 0.5 tablets by mouth daily.     No current facility-administered  medications for this visit.     Past Medical History  Diagnosis Date  . Hypertension   . Facial rash   . Seborrheic keratoses   . Microscopic hematuria     with negative workup   . Fibrocystic disease of breast   . Anxiety   . Compression fracture of L1 lumbar vertebra   . Eustachian tube dysfunction   . Allergic rhinitis   . Rhomboid muscle pain     spasm  . Depression     situational  . Urinary frequency     with nocturia  . Headache   . UTI (lower urinary tract infection)   . Tendinopathy of rotator cuff   . Arthritis     Left AC joint  . Complication of anesthesia     states allergy to a med used with pacemaker - made her wild    ROS:   All systems reviewed and negative except as noted in the HPI.   Past Surgical History  Procedure Laterality Date  . Insert / replace / remove pacemaker    . Esophagogastroduodenoscopy Left 03/22/2014    Procedure: ESOPHAGOGASTRODUODENOSCOPY (EGD);  Surgeon: Arta Silence, MD;  [facility] ENDOSCOPY;  Service: Endoscopy;  Laterality: Left;     Family History  Problem Relation Age of Onset  . Diabetes Mother   . Stroke Mother   . Coronary artery  disease Mother   . Cancer - Lung Father   . Hypertension Sister   . Hypertension Brother   . Diabetes Brother   . Cancer Brother   . Hypertension Brother   . Diabetes Brother   . Cancer Brother      History   Social History  . Marital Status: Widowed    Spouse Name: [name]  . Number of Children: [name]  . Years of Education: [name]   Occupational History  . Not on file.   Social History Main Topics  . Smoking status: Never Smoker   . Smokeless tobacco: Not on file  . Alcohol Use: No  . Drug Use: No  . Sexual Activity: Not on file   Other Topics Concern  . Not on file   Social History Narrative     BP 137/78 mmHg  Pulse 85  Ht 5' 4.5" (1.638 m)  Wt 127 lb (57.607 kg)  BMI 21.47 kg/m2  Physical Exam:  Well appearing elderly woman, NAD HEENT:  Unremarkable Neck:  No JVD, no thyromegally Back:  No CVA tenderness Lungs:  Clear with no wheezes, rales, or rhonchi HEART:  Regular rate rhythm, no murmurs, no rubs, no clicks Abd:  soft, positive bowel sounds, no organomegally, no rebound, no guarding Ext:  2 plus pulses, no edema, no cyanosis, no clubbing Skin:  No rashes no nodules Neuro:  CN II through XII intact, motor grossly intact   DEVICE  Normal device function.  See PaceArt for details.   Assess/Plan:

## 2014-06-12 NOTE — Assessment & Plan Note (Signed)
With her prior GI bleed, she has not had anginal symptoms. Will follow.

## 2014-06-12 NOTE — Assessment & Plan Note (Signed)
Her Medtronic DDD PM is working normally. Will recheck in several months. 

## 2014-06-25 DIAGNOSIS — Z961 Presence of intraocular lens: Secondary | ICD-10-CM | POA: Diagnosis not present

## 2014-06-25 DIAGNOSIS — H3531 Nonexudative age-related macular degeneration: Secondary | ICD-10-CM | POA: Diagnosis not present

## 2014-06-28 ENCOUNTER — Encounter: Payer: Self-pay | Admitting: Interventional Cardiology

## 2014-06-28 ENCOUNTER — Ambulatory Visit (INDEPENDENT_AMBULATORY_CARE_PROVIDER_SITE_OTHER): Payer: Medicare Other | Admitting: Interventional Cardiology

## 2014-06-28 VITALS — BP 138/62 | HR 80 | Ht 64.5 in | Wt 127.5 lb

## 2014-06-28 DIAGNOSIS — R600 Localized edema: Secondary | ICD-10-CM | POA: Diagnosis not present

## 2014-06-28 DIAGNOSIS — R51 Headache: Secondary | ICD-10-CM | POA: Diagnosis not present

## 2014-06-28 DIAGNOSIS — Z950 Presence of cardiac pacemaker: Secondary | ICD-10-CM | POA: Diagnosis not present

## 2014-06-28 DIAGNOSIS — G8929 Other chronic pain: Secondary | ICD-10-CM | POA: Insufficient documentation

## 2014-06-28 DIAGNOSIS — R519 Headache, unspecified: Secondary | ICD-10-CM | POA: Insufficient documentation

## 2014-06-28 DIAGNOSIS — I2510 Atherosclerotic heart disease of native coronary artery without angina pectoris: Secondary | ICD-10-CM | POA: Diagnosis not present

## 2014-06-28 DIAGNOSIS — I10 Essential (primary) hypertension: Secondary | ICD-10-CM | POA: Diagnosis not present

## 2014-06-28 MED ORDER — LISINOPRIL 10 MG PO TABS: 10.0000 mg | ORAL_TABLET | Freq: Every day | ORAL | Status: DC

## 2014-06-28 MED ORDER — AMLODIPINE BESYLATE 5 MG PO TABS
ORAL_TABLET | ORAL | Status: DC
Start: 2014-06-28 — End: 2014-10-29

## 2014-06-28 NOTE — Progress Notes (Signed)
Patient ID: [name], female   DOB: [birth year], 81 y.o.   MRN: [id number]    [address], [place], [place]  [address] Phone: [phone] Fax:  [phone]  Date:  06/28/2014   ID:  [name], DOB 02-21-1925, MRN [id number]  PCP:  [name], MD      History of Present Illness: [name] is a 81 y.o. female who has had a PPM in the past.  SHe has had nonobstructive CAD. In 11/15, she had a duodenal ulcer with bleed. She was transfused.  Anticoagulation has been held since that time.  BP has been well controlled.  She had a pacer check with Dr. Lovena Le and this was normal.    She reports headaches on the left side of her head.  THis has been a chronic issue.  She takes Tylenol with some relief. She has had some LE edema.  BP readings at home are less than 211 systolic.    She does still take care of her yard.  She is active but is not doing any dedicated walking.  She tries to avoid eating out  To avoid excessive salt. She denies any chest discomfort, shortness of breath, dizziness. She has been unable to walk regularly due to construction near her house. The lower extremity edema is her most bothersome issue.   Wt Readings from Last 3 Encounters:  06/28/14 127 lb 8 oz (57.834 kg)  06/12/14 127 lb (57.607 kg)  03/25/14 129 lb 9.6 oz (58.786 kg)     Past Medical History  Diagnosis Date  . Hypertension   . Facial rash   . Seborrheic keratoses   . Microscopic hematuria     with negative workup   . Fibrocystic disease of breast   . Anxiety   . Compression fracture of L1 lumbar vertebra   . Eustachian tube dysfunction   . Allergic rhinitis   . Rhomboid muscle pain     spasm  . Depression     situational  . Urinary frequency     with nocturia  . Headache   . UTI (lower urinary tract infection)   . Tendinopathy of rotator cuff   . Arthritis     Left AC joint  . Complication of anesthesia     states allergy to a med used with  pacemaker - made her wild    Current Outpatient Prescriptions  Medication Sig Dispense Refill  . amLODipine (NORVASC) 10 MG tablet Take 1 tablet (10 mg total) by mouth daily. 90 tablet 3  . calcium carbonate (OS-CAL) 600 MG TABS tablet Take 600 mg by mouth daily.    . Camphor-Menthol (TIGER BALM REGULAR STRENGTH) 11-8 % OINT Apply topically as directed.    . Cholecalciferol (VITAMIN D3) 2000 UNITS TABS Take 1 tablet by mouth daily.     . Cyanocobalamin (VITAMIN B-12 CR PO) Take 1 tablet by mouth daily.     . fish oil-omega-3 fatty acids 1000 MG capsule Take 2 g by mouth 2 (two) times daily.    Marland Kitchen HYDROcodone-acetaminophen (NORCO/VICODIN) 5-325 MG per tablet Take 1 tablet by mouth every 6 (six) hours as needed for severe pain.     . Multiple Vitamin (MULTIVITAMIN) tablet Take 1 tablet by mouth daily.    . pantoprazole (PROTONIX) 40 MG tablet Take 1 tablet (40 mg total) by mouth 2 (two) times daily with breakfast and lunch. 60 tablet 0  . saccharomyces boulardii (FLORASTOR) 250 MG capsule  Take 1 capsule (250 mg total) by mouth 2 (two) times daily. 20 capsule 0  . triamterene-hydrochlorothiazide (MAXZIDE-25) 37.5-25 MG per tablet Take 0.5 tablets by mouth daily.     No current facility-administered medications for this visit.    Allergies:    Allergies  Allergen Reactions  . Clindamycin/Lincomycin Rash  . Antihistamines, Chlorpheniramine-Type     nightmares  . Ativan [Lorazepam]     Possible: agitation  . Imiquimod   . Micardis Hct [Telmisartan-Hctz]     fatigue  . Thiazide-Type Diuretics     Possible photosensitivity  . Ultracet [Tramadol-Acetaminophen]     nightmares    Social History:  The patient  reports that she has never smoked. She does not have any smokeless tobacco history on file. She reports that she does not drink alcohol or use illicit drugs.   Family History:  The patient's family history includes Cancer in her brother and brother; Cancer - Lung in her father;  Coronary artery disease in her mother; Diabetes in her brother, brother, and mother; Hypertension in her brother, brother, and sister; Stroke in her mother.   ROS:  Please see the history of present illness.  No nausea, vomiting.  No fevers, chills.  No focal weakness.  No dysuria. She reports edema throughout the day   All other systems reviewed and negative.   PHYSICAL EXAM: VS:  BP 138/62 mmHg  Pulse 80  Ht 5' 4.5" (1.638 m)  Wt 127 lb 8 oz (57.834 kg)  BMI 21.56 kg/m2 General: Well developed, well nourished, in no acute distress HEENT: normal Neck: no JVD, no carotid bruits Cardiac:  normal S1, S2; RRR;  Lungs:  clear to auscultation bilaterally, no wheezing, rhonchi or rales Abd: soft, nontender, no hepatomegaly Ext: Bilateral pitting edema Skin: warm and dry Neuro:   no focal abnormalities noted Psych: normal affect  EKG:  NSR, NSST    ASSESSMENT AND PLAN:  1. Cardiac pacer: functioning normally.  No syncope or sx of pacer failure.  Not on aspirin due to recent GI bleed. 2. HTN: Controlled at home.  May be having a Side effect from amlodipine. See changes below. 3. Edema:  Elevate legs.  May be related to medication.  Will add lisinopril 10 mg daily.  Decrease amlodipine to 5 mg daily.   Will check labs in about a week. We'll have her come back to the office for recheck in 3 weeks. If her edema is persisting, would try to stop amlodipine completely and increase her ACE inhibitor. I encouraged her to try to elevate her legs as much as possible. She is tolerating her current dose of diuretic. I'm hesitant to change her to furosemide. 4. Headache: Encourage her to use Tylenol rather than NSAIDs due to her GI bleed.  Signed, Mina Marble, MD, [facility] 06/28/2014 9:33 AM

## 2014-06-28 NOTE — Patient Instructions (Signed)
Your physician has recommended you make the following change in your medication:  1) Decrease amlodipine to 5 mg one tablet by mouth once daily 2) Start lisinopril 10 mg one tablet by mouth once daily  Your physician recommends that you return for lab work in: 1 week- BMP  Your physician recommends that you schedule a follow-up appointment in: 2-3 weeks with the PA/ NP for Dr. Irish Lack - BP check and follow up on lower extremity swelling

## 2014-07-04 ENCOUNTER — Other Ambulatory Visit (INDEPENDENT_AMBULATORY_CARE_PROVIDER_SITE_OTHER): Payer: Medicare Other | Admitting: *Deleted

## 2014-07-04 DIAGNOSIS — I10 Essential (primary) hypertension: Secondary | ICD-10-CM

## 2014-07-04 LAB — BASIC METABOLIC PANEL
BUN: 31 mg/dL — ABNORMAL HIGH (ref 6–23)
CO2: 35 meq/L — AB (ref 19–32)
Calcium: 11.3 mg/dL — ABNORMAL HIGH (ref 8.4–10.5)
Chloride: 103 mEq/L (ref 96–112)
Creatinine, Ser: 1.22 mg/dL — ABNORMAL HIGH (ref 0.40–1.20)
GFR: 44.24 mL/min — ABNORMAL LOW (ref >60.00)
Glucose, Bld: 99 mg/dL (ref 70–99)
POTASSIUM: 4.6 meq/L (ref 3.5–5.1)
Sodium: 139 mEq/L (ref 135–145)

## 2014-07-05 ENCOUNTER — Other Ambulatory Visit: Payer: Medicare Other

## 2014-07-16 DIAGNOSIS — R609 Edema, unspecified: Secondary | ICD-10-CM | POA: Diagnosis not present

## 2014-07-16 DIAGNOSIS — I10 Essential (primary) hypertension: Secondary | ICD-10-CM | POA: Diagnosis not present

## 2014-07-19 ENCOUNTER — Ambulatory Visit: Payer: Medicare Other | Admitting: Physician Assistant

## 2014-09-11 ENCOUNTER — Encounter: Payer: Self-pay | Admitting: Internal Medicine

## 2014-09-11 ENCOUNTER — Ambulatory Visit (INDEPENDENT_AMBULATORY_CARE_PROVIDER_SITE_OTHER): Payer: Medicare Other | Admitting: *Deleted

## 2014-09-11 ENCOUNTER — Telehealth: Payer: Self-pay | Admitting: Cardiology

## 2014-09-11 DIAGNOSIS — I442 Atrioventricular block, complete: Secondary | ICD-10-CM | POA: Diagnosis not present

## 2014-09-11 NOTE — Telephone Encounter (Signed)
Attempted to confirm remote transmission with pt. No answer and was unable to leave a message.   

## 2014-09-12 NOTE — Progress Notes (Signed)
Remote pacemaker transmission.   

## 2014-09-14 LAB — CUP PACEART REMOTE DEVICE CHECK
Battery Impedance: 2980 Ohm
Battery Remaining Longevity: 16 mo
Battery Voltage: 2.71 V
Brady Statistic AP VP Percent: 23 %
Brady Statistic AS VP Percent: 72 %
Brady Statistic AS VS Percent: 5 %
Lead Channel Pacing Threshold Amplitude: 0.5 V
Lead Channel Pacing Threshold Pulse Width: 0.4 ms
Lead Channel Setting Pacing Amplitude: 1.75 V
Lead Channel Setting Pacing Amplitude: 2 V
Lead Channel Setting Sensing Sensitivity: 2 mV
MDC IDC MSMT LEADCHNL RA IMPEDANCE VALUE: 495 Ohm
MDC IDC MSMT LEADCHNL RA PACING THRESHOLD AMPLITUDE: 0.675 V
MDC IDC MSMT LEADCHNL RA SENSING INTR AMPL: 1.4 mV
MDC IDC MSMT LEADCHNL RV IMPEDANCE VALUE: 561 Ohm
MDC IDC MSMT LEADCHNL RV PACING THRESHOLD PULSEWIDTH: 0.4 ms
MDC IDC SESS DTM: 20160518192526
MDC IDC SET LEADCHNL RV PACING PULSEWIDTH: 0.4 ms
MDC IDC STAT BRADY AP VS PERCENT: 0 %

## 2014-09-17 DIAGNOSIS — R609 Edema, unspecified: Secondary | ICD-10-CM | POA: Diagnosis not present

## 2014-09-17 DIAGNOSIS — E559 Vitamin D deficiency, unspecified: Secondary | ICD-10-CM | POA: Diagnosis not present

## 2014-09-17 DIAGNOSIS — D81818 Other biotin-dependent carboxylase deficiency: Secondary | ICD-10-CM | POA: Diagnosis not present

## 2014-09-17 DIAGNOSIS — I10 Essential (primary) hypertension: Secondary | ICD-10-CM | POA: Diagnosis not present

## 2014-09-25 ENCOUNTER — Encounter: Payer: Self-pay | Admitting: Cardiology

## 2014-10-22 DIAGNOSIS — I10 Essential (primary) hypertension: Secondary | ICD-10-CM | POA: Diagnosis not present

## 2014-10-26 ENCOUNTER — Other Ambulatory Visit: Payer: Self-pay | Admitting: Interventional Cardiology

## 2014-10-29 ENCOUNTER — Other Ambulatory Visit: Payer: Self-pay

## 2014-10-29 DIAGNOSIS — I10 Essential (primary) hypertension: Secondary | ICD-10-CM

## 2014-10-29 MED ORDER — AMLODIPINE BESYLATE 5 MG PO TABS: ORAL_TABLET | ORAL | Status: DC

## 2014-11-21 DIAGNOSIS — C44329 Squamous cell carcinoma of skin of other parts of face: Secondary | ICD-10-CM | POA: Diagnosis not present

## 2014-11-30 DIAGNOSIS — M19041 Primary osteoarthritis, right hand: Secondary | ICD-10-CM | POA: Diagnosis not present

## 2014-12-10 DIAGNOSIS — M549 Dorsalgia, unspecified: Secondary | ICD-10-CM | POA: Diagnosis not present

## 2014-12-12 DIAGNOSIS — Z85828 Personal history of other malignant neoplasm of skin: Secondary | ICD-10-CM | POA: Diagnosis not present

## 2014-12-12 DIAGNOSIS — C44329 Squamous cell carcinoma of skin of other parts of face: Secondary | ICD-10-CM | POA: Diagnosis not present

## 2014-12-16 ENCOUNTER — Ambulatory Visit (INDEPENDENT_AMBULATORY_CARE_PROVIDER_SITE_OTHER): Payer: Medicare Other | Admitting: *Deleted

## 2014-12-16 ENCOUNTER — Telehealth: Payer: Self-pay | Admitting: Cardiology

## 2014-12-16 ENCOUNTER — Encounter: Payer: Self-pay | Admitting: Internal Medicine

## 2014-12-16 DIAGNOSIS — I442 Atrioventricular block, complete: Secondary | ICD-10-CM

## 2014-12-16 NOTE — Telephone Encounter (Signed)
Spoke with pt and reminded pt of remote transmission that is due today. Pt verbalized understanding.   

## 2014-12-17 NOTE — Progress Notes (Signed)
Remote pacemaker transmission.   

## 2014-12-20 ENCOUNTER — Encounter (HOSPITAL_COMMUNITY): Payer: Self-pay | Admitting: Certified Registered"

## 2014-12-20 ENCOUNTER — Emergency Department (HOSPITAL_COMMUNITY): Payer: Medicare Other

## 2014-12-20 ENCOUNTER — Encounter (HOSPITAL_COMMUNITY): Payer: Self-pay | Admitting: *Deleted

## 2014-12-20 ENCOUNTER — Encounter (HOSPITAL_COMMUNITY)
Admission: EM | Disposition: A | Discharge status: Home Health | Payer: Self-pay | Source: Home / Self Care | Attending: Internal Medicine

## 2014-12-20 ENCOUNTER — Inpatient Hospital Stay (HOSPITAL_COMMUNITY)
Admission: EM | Admit: 2014-12-20 | Discharge: 2014-12-28 | DRG: 377 | Disposition: A | Discharge status: Home Health | Payer: Medicare Other | Attending: Internal Medicine | Admitting: Internal Medicine

## 2014-12-20 DIAGNOSIS — F558 Abuse of other non-psychoactive substances: Secondary | ICD-10-CM | POA: Diagnosis not present

## 2014-12-20 DIAGNOSIS — Z823 Family history of stroke: Secondary | ICD-10-CM | POA: Diagnosis not present

## 2014-12-20 DIAGNOSIS — I63532 Cerebral infarction due to unspecified occlusion or stenosis of left posterior cerebral artery: Secondary | ICD-10-CM | POA: Diagnosis not present

## 2014-12-20 DIAGNOSIS — I63432 Cerebral infarction due to embolism of left posterior cerebral artery: Secondary | ICD-10-CM | POA: Diagnosis not present

## 2014-12-20 DIAGNOSIS — N390 Urinary tract infection, site not specified: Secondary | ICD-10-CM | POA: Diagnosis not present

## 2014-12-20 DIAGNOSIS — I6523 Occlusion and stenosis of bilateral carotid arteries: Secondary | ICD-10-CM | POA: Diagnosis not present

## 2014-12-20 DIAGNOSIS — Z8249 Family history of ischemic heart disease and other diseases of the circulatory system: Secondary | ICD-10-CM | POA: Diagnosis not present

## 2014-12-20 DIAGNOSIS — Z833 Family history of diabetes mellitus: Secondary | ICD-10-CM | POA: Diagnosis not present

## 2014-12-20 DIAGNOSIS — R4701 Aphasia: Secondary | ICD-10-CM | POA: Diagnosis present

## 2014-12-20 DIAGNOSIS — Z7982 Long term (current) use of aspirin: Secondary | ICD-10-CM

## 2014-12-20 DIAGNOSIS — Z79899 Other long term (current) drug therapy: Secondary | ICD-10-CM

## 2014-12-20 DIAGNOSIS — T39315A Adverse effect of propionic acid derivatives, initial encounter: Secondary | ICD-10-CM | POA: Diagnosis present

## 2014-12-20 DIAGNOSIS — I10 Essential (primary) hypertension: Secondary | ICD-10-CM | POA: Diagnosis present

## 2014-12-20 DIAGNOSIS — K254 Chronic or unspecified gastric ulcer with hemorrhage: Secondary | ICD-10-CM | POA: Diagnosis not present

## 2014-12-20 DIAGNOSIS — R55 Syncope and collapse: Secondary | ICD-10-CM | POA: Diagnosis present

## 2014-12-20 DIAGNOSIS — N179 Acute kidney failure, unspecified: Secondary | ICD-10-CM | POA: Diagnosis present

## 2014-12-20 DIAGNOSIS — D62 Acute posthemorrhagic anemia: Secondary | ICD-10-CM | POA: Diagnosis not present

## 2014-12-20 DIAGNOSIS — H5347 Heteronymous bilateral field defects: Secondary | ICD-10-CM | POA: Diagnosis not present

## 2014-12-20 DIAGNOSIS — N289 Disorder of kidney and ureter, unspecified: Secondary | ICD-10-CM | POA: Diagnosis not present

## 2014-12-20 DIAGNOSIS — E785 Hyperlipidemia, unspecified: Secondary | ICD-10-CM | POA: Diagnosis present

## 2014-12-20 DIAGNOSIS — M545 Low back pain: Secondary | ICD-10-CM | POA: Diagnosis present

## 2014-12-20 DIAGNOSIS — R739 Hyperglycemia, unspecified: Secondary | ICD-10-CM | POA: Diagnosis not present

## 2014-12-20 DIAGNOSIS — Z85828 Personal history of other malignant neoplasm of skin: Secondary | ICD-10-CM | POA: Diagnosis not present

## 2014-12-20 DIAGNOSIS — K2980 Duodenitis without bleeding: Secondary | ICD-10-CM | POA: Diagnosis present

## 2014-12-20 DIAGNOSIS — I639 Cerebral infarction, unspecified: Secondary | ICD-10-CM | POA: Diagnosis present

## 2014-12-20 DIAGNOSIS — H9190 Unspecified hearing loss, unspecified ear: Secondary | ICD-10-CM | POA: Diagnosis not present

## 2014-12-20 DIAGNOSIS — K922 Gastrointestinal hemorrhage, unspecified: Secondary | ICD-10-CM | POA: Diagnosis present

## 2014-12-20 DIAGNOSIS — N342 Other urethritis: Secondary | ICD-10-CM | POA: Diagnosis not present

## 2014-12-20 DIAGNOSIS — K253 Acute gastric ulcer without hemorrhage or perforation: Secondary | ICD-10-CM | POA: Diagnosis present

## 2014-12-20 DIAGNOSIS — K250 Acute gastric ulcer with hemorrhage: Secondary | ICD-10-CM | POA: Diagnosis not present

## 2014-12-20 DIAGNOSIS — R29810 Facial weakness: Secondary | ICD-10-CM | POA: Diagnosis not present

## 2014-12-20 DIAGNOSIS — K269 Duodenal ulcer, unspecified as acute or chronic, without hemorrhage or perforation: Secondary | ICD-10-CM | POA: Diagnosis not present

## 2014-12-20 DIAGNOSIS — K449 Diaphragmatic hernia without obstruction or gangrene: Secondary | ICD-10-CM | POA: Diagnosis present

## 2014-12-20 DIAGNOSIS — B9620 Unspecified Escherichia coli [E. coli] as the cause of diseases classified elsewhere: Secondary | ICD-10-CM | POA: Diagnosis present

## 2014-12-20 DIAGNOSIS — G9340 Encephalopathy, unspecified: Secondary | ICD-10-CM | POA: Diagnosis present

## 2014-12-20 DIAGNOSIS — G459 Transient cerebral ischemic attack, unspecified: Secondary | ICD-10-CM

## 2014-12-20 DIAGNOSIS — D696 Thrombocytopenia, unspecified: Secondary | ICD-10-CM | POA: Diagnosis present

## 2014-12-20 DIAGNOSIS — J9811 Atelectasis: Secondary | ICD-10-CM | POA: Diagnosis not present

## 2014-12-20 DIAGNOSIS — I6789 Other cerebrovascular disease: Secondary | ICD-10-CM | POA: Diagnosis not present

## 2014-12-20 DIAGNOSIS — R404 Transient alteration of awareness: Secondary | ICD-10-CM | POA: Diagnosis not present

## 2014-12-20 DIAGNOSIS — H547 Unspecified visual loss: Secondary | ICD-10-CM

## 2014-12-20 DIAGNOSIS — Z950 Presence of cardiac pacemaker: Secondary | ICD-10-CM | POA: Diagnosis not present

## 2014-12-20 DIAGNOSIS — I951 Orthostatic hypotension: Secondary | ICD-10-CM | POA: Diagnosis present

## 2014-12-20 DIAGNOSIS — H5310 Unspecified subjective visual disturbances: Secondary | ICD-10-CM

## 2014-12-20 DIAGNOSIS — I2510 Atherosclerotic heart disease of native coronary artery without angina pectoris: Secondary | ICD-10-CM | POA: Diagnosis present

## 2014-12-20 DIAGNOSIS — K921 Melena: Secondary | ICD-10-CM | POA: Diagnosis present

## 2014-12-20 DIAGNOSIS — I442 Atrioventricular block, complete: Secondary | ICD-10-CM | POA: Diagnosis not present

## 2014-12-20 DIAGNOSIS — G8929 Other chronic pain: Secondary | ICD-10-CM | POA: Diagnosis present

## 2014-12-20 DIAGNOSIS — E860 Dehydration: Secondary | ICD-10-CM | POA: Diagnosis present

## 2014-12-20 DIAGNOSIS — G451 Carotid artery syndrome (hemispheric): Secondary | ICD-10-CM | POA: Diagnosis not present

## 2014-12-20 DIAGNOSIS — I6350 Cerebral infarction due to unspecified occlusion or stenosis of unspecified cerebral artery: Secondary | ICD-10-CM | POA: Diagnosis not present

## 2014-12-20 DIAGNOSIS — D649 Anemia, unspecified: Secondary | ICD-10-CM | POA: Diagnosis not present

## 2014-12-20 DIAGNOSIS — R079 Chest pain, unspecified: Secondary | ICD-10-CM | POA: Diagnosis not present

## 2014-12-20 DIAGNOSIS — D72829 Elevated white blood cell count, unspecified: Secondary | ICD-10-CM | POA: Diagnosis present

## 2014-12-20 HISTORY — PX: ESOPHAGOGASTRODUODENOSCOPY: SHX5428

## 2014-12-20 LAB — COMPREHENSIVE METABOLIC PANEL WITH GFR
ALT: 14 U/L (ref 14–54)
AST: 18 U/L (ref 15–41)
Albumin: 2.8 g/dL — ABNORMAL LOW (ref 3.5–5.0)
Alkaline Phosphatase: 30 U/L — ABNORMAL LOW (ref 38–126)
Anion gap: 6 (ref 5–15)
BUN: 73 mg/dL — ABNORMAL HIGH (ref 6–20)
CO2: 24 mmol/L (ref 22–32)
Calcium: 9.8 mg/dL (ref 8.9–10.3)
Chloride: 108 mmol/L (ref 101–111)
Creatinine, Ser: 1.59 mg/dL — ABNORMAL HIGH (ref 0.44–1.00)
GFR calc Af Amer: 32 mL/min — ABNORMAL LOW (ref >60)
GFR calc non Af Amer: 28 mL/min — ABNORMAL LOW (ref >60)
Glucose, Bld: 112 mg/dL — ABNORMAL HIGH (ref 65–99)
Potassium: 4.8 mmol/L (ref 3.5–5.1)
Sodium: 138 mmol/L (ref 135–145)
Total Bilirubin: 0.5 mg/dL (ref 0.3–1.2)
Total Protein: 5.2 g/dL — ABNORMAL LOW (ref 6.5–8.1)

## 2014-12-20 LAB — CBC WITH DIFFERENTIAL/PLATELET
Basophils Absolute: 0 K/uL (ref 0.0–0.1)
Basophils Relative: 0 % (ref 0–1)
Eosinophils Absolute: 0.2 K/uL (ref 0.0–0.7)
Eosinophils Relative: 2 % (ref 0–5)
HCT: 31.1 % — ABNORMAL LOW (ref 36.0–46.0)
Hemoglobin: 10.8 g/dL — ABNORMAL LOW (ref 12.0–15.0)
Lymphocytes Relative: 12 % (ref 12–46)
Lymphs Abs: 1.7 K/uL (ref 0.7–4.0)
MCH: 30.1 pg (ref 26.0–34.0)
MCHC: 34.7 g/dL (ref 30.0–36.0)
MCV: 86.6 fL (ref 78.0–100.0)
Monocytes Absolute: 1.2 K/uL — ABNORMAL HIGH (ref 0.1–1.0)
Monocytes Relative: 9 % (ref 3–12)
Neutro Abs: 10.3 K/uL — ABNORMAL HIGH (ref 1.7–7.7)
Neutrophils Relative %: 77 % (ref 43–77)
Platelets: 179 K/uL (ref 150–400)
RBC: 3.59 MIL/uL — ABNORMAL LOW (ref 3.87–5.11)
RDW: 14 % (ref 11.5–15.5)
WBC: 13.3 K/uL — ABNORMAL HIGH (ref 4.0–10.5)

## 2014-12-20 LAB — CUP PACEART REMOTE DEVICE CHECK
Battery Voltage: 2.69 V
Brady Statistic AP VP Percent: 30 %
Brady Statistic AP VS Percent: 1 %
Brady Statistic AS VP Percent: 62 %
Date Time Interrogation Session: 20160822194731
Lead Channel Pacing Threshold Amplitude: 0.375 V
Lead Channel Pacing Threshold Amplitude: 1 V
Lead Channel Pacing Threshold Pulse Width: 0.4 ms
Lead Channel Sensing Intrinsic Amplitude: 2.8 mV
Lead Channel Setting Pacing Amplitude: 2 V
Lead Channel Setting Pacing Pulse Width: 0.4 ms
Lead Channel Setting Sensing Sensitivity: 2 mV
MDC IDC MSMT BATTERY IMPEDANCE: 3325 Ohm
MDC IDC MSMT BATTERY REMAINING LONGEVITY: 14 mo
MDC IDC MSMT LEADCHNL RA IMPEDANCE VALUE: 498 Ohm
MDC IDC MSMT LEADCHNL RV IMPEDANCE VALUE: 587 Ohm
MDC IDC MSMT LEADCHNL RV PACING THRESHOLD PULSEWIDTH: 0.4 ms
MDC IDC SET LEADCHNL RA PACING AMPLITUDE: 2 V
MDC IDC STAT BRADY AS VS PERCENT: 7 %

## 2014-12-20 LAB — TSH: TSH: 1.037 u[IU]/mL (ref 0.350–4.500)

## 2014-12-20 LAB — HEMOGLOBIN AND HEMATOCRIT, BLOOD
HCT: 27.2 % — ABNORMAL LOW (ref 36.0–46.0)
Hemoglobin: 9.5 g/dL — ABNORMAL LOW (ref 12.0–15.0)

## 2014-12-20 LAB — PREPARE RBC (CROSSMATCH)

## 2014-12-20 LAB — CBG MONITORING, ED: Glucose-Capillary: 106 mg/dL — ABNORMAL HIGH (ref 65–99)

## 2014-12-20 LAB — VITAMIN B12: VITAMIN B 12: 1814 pg/mL — AB (ref 180–914)

## 2014-12-20 LAB — I-STAT TROPONIN, ED: Troponin i, poc: 0 ng/mL (ref 0.00–0.08)

## 2014-12-20 LAB — POC OCCULT BLOOD, ED: FECAL OCCULT BLD: POSITIVE — AB

## 2014-12-20 LAB — MRSA PCR SCREENING: MRSA by PCR: NEGATIVE

## 2014-12-20 SURGERY — EGD (ESOPHAGOGASTRODUODENOSCOPY)
Anesthesia: Moderate Sedation

## 2014-12-20 MED ORDER — PANTOPRAZOLE SODIUM 40 MG IV SOLR
40.0000 mg | Freq: Two times a day (BID) | INTRAVENOUS | Status: DC
  Administered 2014-12-20: 40 mg via INTRAVENOUS
  Filled 2014-12-20: qty 40

## 2014-12-20 MED ORDER — SODIUM CHLORIDE 0.9 % IV SOLN
8.0000 mg/h | INTRAVENOUS | Status: DC
  Administered 2014-12-20 – 2014-12-23 (×5): 8 mg/h via INTRAVENOUS
  Filled 2014-12-20 (×14): qty 80

## 2014-12-20 MED ORDER — FENTANYL CITRATE (PF) 100 MCG/2ML IJ SOLN
INTRAMUSCULAR | Status: DC | PRN
  Administered 2014-12-20: 25 ug via INTRAVENOUS

## 2014-12-20 MED ORDER — BUTAMBEN-TETRACAINE-BENZOCAINE 2-2-14 % EX AERO
INHALATION_SPRAY | CUTANEOUS | Status: DC | PRN
  Administered 2014-12-20: 2 via TOPICAL

## 2014-12-20 MED ORDER — PANTOPRAZOLE SODIUM 40 MG IV SOLR: 40.0000 mg | Freq: Two times a day (BID) | INTRAVENOUS | Status: DC

## 2014-12-20 MED ORDER — ACETAMINOPHEN 650 MG RE SUPP
650.0000 mg | Freq: Four times a day (QID) | RECTAL | Status: DC | PRN
Start: 2014-12-20 — End: 2014-12-28

## 2014-12-20 MED ORDER — CYANOCOBALAMIN 500 MCG PO TABS
500.0000 ug | ORAL_TABLET | Freq: Every day | ORAL | Status: DC
  Administered 2014-12-21 – 2014-12-28 (×8): 500 ug via ORAL
  Filled 2014-12-20 (×11): qty 1

## 2014-12-20 MED ORDER — ONDANSETRON HCL 4 MG/2ML IJ SOLN: 4.0000 mg | Freq: Four times a day (QID) | INTRAMUSCULAR | Status: DC | PRN

## 2014-12-20 MED ORDER — SODIUM CHLORIDE 0.9 % IV SOLN
Freq: Once | INTRAVENOUS | Status: AC
  Administered 2014-12-21: 16:00:00 via INTRAVENOUS

## 2014-12-20 MED ORDER — FENTANYL CITRATE (PF) 100 MCG/2ML IJ SOLN
INTRAMUSCULAR | Status: AC
  Filled 2014-12-20: qty 2

## 2014-12-20 MED ORDER — ACETAMINOPHEN 325 MG PO TABS: 650.0000 mg | ORAL_TABLET | Freq: Four times a day (QID) | ORAL | Status: DC | PRN

## 2014-12-20 MED ORDER — MIDAZOLAM HCL 5 MG/ML IJ SOLN
INTRAMUSCULAR | Status: AC
  Filled 2014-12-20: qty 2

## 2014-12-20 MED ORDER — ONDANSETRON HCL 4 MG PO TABS: 4.0000 mg | ORAL_TABLET | Freq: Four times a day (QID) | ORAL | Status: DC | PRN

## 2014-12-20 MED ORDER — SODIUM CHLORIDE 0.9 % IV BOLUS (SEPSIS): 500.0000 mL | Freq: Once | INTRAVENOUS | Status: AC

## 2014-12-20 MED ORDER — MIDAZOLAM HCL 10 MG/2ML IJ SOLN
INTRAMUSCULAR | Status: DC | PRN
  Administered 2014-12-20: 1 mg via INTRAVENOUS
  Administered 2014-12-20: 2 mg via INTRAVENOUS

## 2014-12-20 MED ORDER — SODIUM CHLORIDE 0.9 % IV SOLN: INTRAVENOUS | Status: DC

## 2014-12-20 MED ORDER — TRAMADOL HCL 50 MG PO TABS
50.0000 mg | ORAL_TABLET | Freq: Two times a day (BID) | ORAL | Status: DC | PRN
  Administered 2014-12-28: 50 mg via ORAL
  Filled 2014-12-20: qty 1

## 2014-12-20 MED ORDER — SODIUM CHLORIDE 0.9 % IV SOLN
INTRAVENOUS | Status: AC
Start: 2014-12-20 — End: 2014-12-21
  Administered 2014-12-20: 75 mL/h via INTRAVENOUS

## 2014-12-20 NOTE — ED Notes (Signed)
Pt ambulated to bathroom with 1 assist.  After having bloody loose stool (per pt), pt began feeling syncopal.  Was assisted to wheelchair.  MD notified.

## 2014-12-20 NOTE — H&P (Signed)
[facility] History and Physical  HEDI BARKAN QIO:[id number] DOB: [birth year] DOA: 12/20/2014  Referring physician: Dr. Alvino Chapel PCP: [name], MD   Chief Complaint: Syncope and positive blood in her stools.  HPI: MILISSA FESPERMAN is a 81 y.o. female with a past medical history significant for hypertension, dyslipidemia, chronic back pain and history of skin cancer; who presented to the ED secondary to syncope episode. Patient reports that she was feeling okay until this morning when she was walking toward the kitchen to feed her dog and black out. Patient was initially unable to get back on her feet for a while and then ended crawling towards her telephone and call her daughter. When the daughter arrived she noticed son blood in the patient's underwear and the patient reports some abdominal pain. There was no signs of postictal, tongue bite, stool or urine incontinence appreciated on the floor. Patient denies chest pain, fever, chills, nausea, vomiting, hematemesis, dysuria or any other acute complaints. Daughter reported that for the last 2 weeks or so patient has been overusing ibuprofen due to her chronic lower back pain.  In the ED patient experienced another episode of abdominal discomfort relieved by bowel movement (there was frank bright red blood appreciated with her stools). Chest x-ray with some opacities in her lower left lung, but unclear if infiltrates or not; there is slight decrease in her hemoglobin level, bump on her BUN and is slightly increase of her creatinine level. [facility] has been called to admit the patient for further evaluation and treatment of syncope and GI bleed.   Review of Systems:  Negative except as otherwise mentioned on history of present illness.  Past Medical History  Diagnosis Date  . Hypertension   . Facial rash   . Seborrheic keratoses   . Microscopic hematuria     with negative workup   . Fibrocystic disease of  breast   . Anxiety   . Compression fracture of L1 lumbar vertebra   . Eustachian tube dysfunction   . Allergic rhinitis   . Rhomboid muscle pain     spasm  . Depression     situational  . Urinary frequency     with nocturia  . Headache   . UTI (lower urinary tract infection)   . Tendinopathy of rotator cuff   . Arthritis     Left AC joint  . Complication of anesthesia     states allergy to a med used with pacemaker - made her wild   Past Surgical History  Procedure Laterality Date  . Insert / replace / remove pacemaker    . Esophagogastroduodenoscopy Left 03/22/2014    Procedure: ESOPHAGOGASTRODUODENOSCOPY (EGD);  Surgeon: Arta Silence, MD;  Location: [facility] ENDOSCOPY;  Service: Endoscopy;  Laterality: Left;   Social History:  reports that she has never smoked. She does not have any smokeless tobacco history on file. She reports that she does not drink alcohol or use illicit drugs.  Allergies  Allergen Reactions  . Clindamycin/Lincomycin Rash  . Antihistamines, Chlorpheniramine-Type     nightmares  . Ativan [Lorazepam]     Possible: agitation  . Imiquimod   . Micardis Hct [Telmisartan-Hctz]     fatigue  . Thiazide-Type Diuretics     Possible photosensitivity  . Ultracet [Tramadol-Acetaminophen]     nightmares    Family History  Problem Relation Age of Onset  . Diabetes Mother   . Stroke Mother   . Coronary artery disease Mother   . Cancer -  Lung Father   . Hypertension Sister   . Hypertension Brother   . Diabetes Brother   . Cancer Brother   . Hypertension Brother   . Diabetes Brother   . Cancer Brother     Prior to Admission medications   Medication Sig Start Date End Date Taking? Authorizing Provider  amLODipine (NORVASC) 10 MG tablet Take 10 mg by mouth daily.   Yes Historical Provider, MD  aspirin 81 MG chewable tablet Chew 81 mg by mouth daily.   Yes Historical Provider, MD  Calcium Carb-Cholecalciferol (CALCIUM 600+D3) 600-800 MG-UNIT TABS Take 1  tablet by mouth daily.   Yes Historical Provider, MD  cholecalciferol (VITAMIN D) 400 UNITS TABS tablet Take 400 Units by mouth daily.    Yes Historical Provider, MD  losartan (COZAAR) 25 MG tablet Take 25 mg by mouth daily.   Yes Historical Provider, MD  Omega-3 Fatty Acids (FISH OIL) 1200 MG CAPS Take 1,200 mg by mouth daily.   Yes Historical Provider, MD  traMADol (ULTRAM) 50 MG tablet Take 50 mg by mouth every 12 (twelve) hours.   Yes Historical Provider, MD  triamterene-hydrochlorothiazide (MAXZIDE-25) 37.5-25 MG per tablet Take 1.5 tablets by mouth daily. Take 1 & 1/2 tablets by mouth once daily   Yes Historical Provider, MD  vitamin B-12 (CYANOCOBALAMIN) 500 MCG tablet Take 500 mcg by mouth daily.   Yes Historical Provider, MD  amLODipine (NORVASC) 5 MG tablet Take one tablet by mouth once daily Patient not taking: Reported on 12/20/2014 10/29/14   Jettie Booze, MD  lisinopril (PRINIVIL,ZESTRIL) 10 MG tablet Take 1 tablet (10 mg total) by mouth daily. Patient not taking: Reported on 12/20/2014 06/28/14   Jettie Booze, MD   Physical Exam: Filed Vitals:   12/20/14 1300 12/20/14 1315 12/20/14 1330 12/20/14 1400  BP: 110/90  139/50 127/50  Pulse: 62 66 56 63  Temp:      TempSrc:      Resp: 18 25 17 13   Height:      Weight:      SpO2: 100% 97% 97% 99%    Wt Readings from Last 3 Encounters:  12/20/14 58.968 kg (130 lb)  06/28/14 57.834 kg (127 lb 8 oz)  06/12/14 57.607 kg (127 lb)    General:  Appears calm and in no acute distress. Patient reports some abdominal pain during her ED stay but resolved after having BM (which was bloody). No chest pain, no diaphoresis, patient was oriented 3 and able to follow commands. Eyes: PERRL, normal lids, irises & conjunctiva, no icterus, no nystagmus ENT: Hard of hearing; dry mucous membranes, no erythema, no exudates, no thrush. There is no drainage out of ears or nostrils Neck: no LAD, masses or thyromegaly, no JVD Cardiovascular:  Regular rate, S1 and S2 appreciated; no rubs or gallops. Lower extremity without edema or cyanosis. Respiratory: CTA bilaterally, no w/r/r. Normal respiratory effort. Abdomen: soft, no tenderness appreciated on exam; no distention, no guarding, positive bowel sounds Skin: no rash or induration seen on exam Musculoskeletal: grossly normal tone BUE/BLE; full range of motion Psychiatric: grossly normal mood and affect, speech fluent and appropriate Neurologic: grossly non-focal.          Labs on Admission:  Basic Metabolic Panel:  Recent Labs Lab 12/20/14 1036  NA 138  K 4.8  CL 108  CO2 24  GLUCOSE 112*  BUN 73*  CREATININE 1.59*  CALCIUM 9.8   Liver Function Tests:  Recent Labs Lab 12/20/14 1036  AST 18  ALT 14  ALKPHOS 30*  BILITOT 0.5  PROT 5.2*  ALBUMIN 2.8*   CBC:  Recent Labs Lab 12/20/14 1036  WBC 13.3*  NEUTROABS 10.3*  HGB 10.8*  HCT 31.1*  MCV 86.6  PLT 179   CBG:  Recent Labs Lab 12/20/14 1010  GLUCAP 106*    Radiological Exams on Admission: Dg Chest 2 View  12/20/2014   CLINICAL DATA:  Chest pain for 1 day and acute syncope today.  EXAM: CHEST  2 VIEW  COMPARISON:  05/31/2006 chest radiograph  FINDINGS: Cardiomegaly and left-sided pacemaker again noted.  Patchy left basilar opacity is noted and may represent atelectasis or airspace disease.  There is no evidence of pneumothorax, pleural effusion or consolidation.  A 60% T6 compression fracture and 85% L1 compression fracture are age indeterminate but new since 2008.  IMPRESSION: Patchy left basilar opacity -favor atelectasis over airspace disease. Consider radiographic follow-up in 2-3 weeks.  T6 and L1 compression fractures of uncertain chronicity but new since 2008. Correlate clinically.   Electronically Signed   By: Margarette Canada M.D.   On: 12/20/2014 11:27    EKG:  Critical rate and sinus rhythm; no acute ischemic changes; positive PVCs  Assessment/Plan 1-Syncope/collapse: Appears to be  secondary to orthostatic hypotension. This condition multifactorial in nature due to dehydration on one end (patient has not been eating and drinking properly lately and was actively taking her diuretics as prescribed); also patient with a positive GI bleed and decrease in her hemoglobin range from baseline. -Patient will be admitted to step down unit -She will be type and screen -2 large IV bores -GI consulted (concerns for upper GI) -Will start IV Protonix -Follow hemoglobin trend -Diuretics and antihypertensive agents on hold -Will provide fluid resuscitation and supportive care  2-Complete heart block s/p PPM 2009: Appears to be working as intended. Recently checked by Dr. Lovena Le -Patient will also have telemetry assessment during hospitalization.  3-Essential hypertension: Blood pressure soft on presentation. Given concerns of syncope and GI bleed antihypertensive agents will be held -Will follow vital signs closely -Follow up will be in status -Resume once blood pressure stable/rising.  4-Acute kidney injury: Due to decreased perfusion in the setting of GI bleed, soft hypertension and continue use of diuretics. -Will hold nephrotoxic agents -Will transfuse as needed -Will check urinalysis -Follow renal function trend -Will provide fluid resuscitation.  5-Leukocytosis: No fever, dysuria, cough or any other symptoms suggesting infection. -most likely secondary to stress demargination -Will hold on antibiotics for now -Will check UA -Will follow WBCs trend -Chest x-ray with some opacities at the bases of her lungs were more consistent with atelectasis. Recommendations per radiology Department is to repeat 2 views chest x-ray in 2-3 weeks.  6-Hyperglycemia: Without prior history of diabetes -Will check A1c and repeat fasting CBG in a.m.  7-Dyslipidemia: Will check lipid profile. -Patient was taken fish oil at home  8-anemia: Most likely secondary to iron deficiency anemia from  GI bleed. -Will check anemia panel.   Gastroenterology: (Dr. [name])  Code Status: Full code DVT Prophylaxis: SCDs Family Communication: Daughter at bedside Disposition Plan: LOS more than 2 midnights; step down; inpatient status  Time spent: 70 minutes (more than 50% of the time dedicated to face-to-face evaluation, update to family member at bedside, coordination of care and discussion with a specialist)  [place], [facility] Pager [phone]

## 2014-12-20 NOTE — Consult Note (Signed)
[facility] Consult Note  Referring Provider: No ref. provider found Primary Care Physician:  [name], MD Primary Gastroenterologist:  Dr.  Laurel Dimmer Complaint: Syncope and bloody stools HPI: [name] is an 81 y.o. white female  who suffered a syncopal episode today and was found to be incontinent of some bloody stool repeated 1 in the emergency room. She was significantly orthostatic although with a stable hemoglobin compared to last baseline. She denies a chest or abdominal pain or any nausea or vomiting. She had a significant bump in her BUN to 73 with slight elevation in creatinine 1.69. She had a similar presentation in February and was found to have 2 opposing antral ulcers. She was treated for eradication of H. pylori but did not have any antipeptic therapy afterwards. Her daughter states that she has been taking ibuprofen. He has never had a colonoscopy.  Past Medical History  Diagnosis Date  . Hypertension   . Facial rash   . Seborrheic keratoses   . Microscopic hematuria     with negative workup   . Fibrocystic disease of breast   . Anxiety   . Compression fracture of L1 lumbar vertebra   . Eustachian tube dysfunction   . Allergic rhinitis   . Rhomboid muscle pain     spasm  . Depression     situational  . Urinary frequency     with nocturia  . Headache   . UTI (lower urinary tract infection)   . Tendinopathy of rotator cuff   . Arthritis     Left AC joint  . Complication of anesthesia     states allergy to a med used with pacemaker - made her wild    Past Surgical History  Procedure Laterality Date  . Insert / replace / remove pacemaker    . Esophagogastroduodenoscopy Left 03/22/2014    Procedure: ESOPHAGOGASTRODUODENOSCOPY (EGD);  Surgeon: Arta Silence, MD;  Location: [facility] ENDOSCOPY;  Service: Endoscopy;  Laterality: Left;     (Not in a hospital admission)  Allergies:  Allergies  Allergen Reactions  .  Clindamycin/Lincomycin Rash  . Antihistamines, Chlorpheniramine-Type     nightmares  . Ativan [Lorazepam]     Possible: agitation  . Imiquimod   . Micardis Hct [Telmisartan-Hctz]     fatigue  . Thiazide-Type Diuretics     Possible photosensitivity  . Ultracet [Tramadol-Acetaminophen]     nightmares    Family History  Problem Relation Age of Onset  . Diabetes Mother   . Stroke Mother   . Coronary artery disease Mother   . Cancer - Lung Father   . Hypertension Sister   . Hypertension Brother   . Diabetes Brother   . Cancer Brother   . Hypertension Brother   . Diabetes Brother   . Cancer Brother     Social History:  reports that she has never smoked. She does not have any smokeless tobacco history on file. She reports that she does not drink alcohol or use illicit drugs.  Review of Systems: negative except as above   Blood pressure 130/53, pulse 67, temperature 98.1 F (36.7 C), temperature source Oral, resp. rate 20, height 5' 4.5" (1.638 m), weight 58.968 kg (130 lb), SpO2 99 %. Head: Normocephalic, without obvious abnormality, atraumatic Neck: no adenopathy, no carotid bruit, no JVD, supple, symmetrical, trachea midline and thyroid not enlarged, symmetric, no tenderness/mass/nodules Resp: clear to auscultation bilaterally Cardio: regular rate and rhythm, S1, S2 normal, no murmur, click, rub or gallop GI: Abdomen  soft nondistended with normoactive bowel sounds. No hepatomegaly masses or guarding Extremities: extremities normal, atraumatic, no cyanosis or edema  Results for orders placed or performed during the hospital encounter of 12/20/14 (from the past 48 hour(s))  CBG monitoring, ED     Status: Abnormal   Collection Time: 12/20/14 10:10 AM  Result Value Ref Range   Glucose-Capillary 106 (H) 65 - 99 mg/dL  I-stat troponin, ED     Status: None   Collection Time: 12/20/14 10:34 AM  Result Value Ref Range   Troponin i, poc 0.00 0.00 - 0.08 ng/mL   Comment 3             Comment: Due to the release kinetics of cTnI, a negative result within the first hours of the onset of symptoms does not rule out myocardial infarction with certainty. If myocardial infarction is still suspected, repeat the test at appropriate intervals.   Comprehensive metabolic panel     Status: Abnormal   Collection Time: 12/20/14 10:36 AM  Result Value Ref Range   Sodium 138 135 - 145 mmol/L   Potassium 4.8 3.5 - 5.1 mmol/L   Chloride 108 101 - 111 mmol/L   CO2 24 22 - 32 mmol/L   Glucose, Bld 112 (H) 65 - 99 mg/dL   BUN 73 (H) 6 - 20 mg/dL   Creatinine, Ser 1.59 (H) 0.44 - 1.00 mg/dL   Calcium 9.8 8.9 - 10.3 mg/dL   Total Protein 5.2 (L) 6.5 - 8.1 g/dL   Albumin 2.8 (L) 3.5 - 5.0 g/dL   AST 18 15 - 41 U/L   ALT 14 14 - 54 U/L   Alkaline Phosphatase 30 (L) 38 - 126 U/L   Total Bilirubin 0.5 0.3 - 1.2 mg/dL   GFR calc non Af Amer 28 (L) >60 mL/min   GFR calc Af Amer 32 (L) >60 mL/min    Comment: (NOTE) The eGFR has been calculated using the CKD EPI equation. This calculation has not been validated in all clinical situations. eGFR's persistently <60 mL/min signify possible Chronic Kidney Disease.    Anion gap 6 5 - 15  CBC with Differential     Status: Abnormal   Collection Time: 12/20/14 10:36 AM  Result Value Ref Range   WBC 13.3 (H) 4.0 - 10.5 K/uL   RBC 3.59 (L) 3.87 - 5.11 MIL/uL   Hemoglobin 10.8 (L) 12.0 - 15.0 g/dL   HCT 31.1 (L) 36.0 - 46.0 %   MCV 86.6 78.0 - 100.0 fL   MCH 30.1 26.0 - 34.0 pg   MCHC 34.7 30.0 - 36.0 g/dL   RDW 14.0 11.5 - 15.5 %   Platelets 179 150 - 400 K/uL   Neutrophils Relative % 77 43 - 77 %   Neutro Abs 10.3 (H) 1.7 - 7.7 K/uL   Lymphocytes Relative 12 12 - 46 %   Lymphs Abs 1.7 0.7 - 4.0 K/uL   Monocytes Relative 9 3 - 12 %   Monocytes Absolute 1.2 (H) 0.1 - 1.0 K/uL   Eosinophils Relative 2 0 - 5 %   Eosinophils Absolute 0.2 0.0 - 0.7 K/uL   Basophils Relative 0 0 - 1 %   Basophils Absolute 0.0 0.0 - 0.1 K/uL  POC occult  blood, ED RN will collect     Status: Abnormal   Collection Time: 12/20/14 12:55 PM  Result Value Ref Range   Fecal Occult Bld POSITIVE (A) NEGATIVE   Dg Chest 2 View  12/20/2014  CLINICAL DATA:  Chest pain for 1 day and acute syncope today.  EXAM: CHEST  2 VIEW  COMPARISON:  05/31/2006 chest radiograph  FINDINGS: Cardiomegaly and left-sided pacemaker again noted.  Patchy left basilar opacity is noted and may represent atelectasis or airspace disease.  There is no evidence of pneumothorax, pleural effusion or consolidation.  A 60% T6 compression fracture and 85% L1 compression fracture are age indeterminate but new since 2008.  IMPRESSION: Patchy left basilar opacity -favor atelectasis over airspace disease. Consider radiographic follow-up in 2-3 weeks.  T6 and L1 compression fractures of uncertain chronicity but new since 2008. Correlate clinically.   Electronically Signed   By: Margarette Canada M.D.   On: 12/20/2014 11:27    Assessment: GI bleeding with hemodynamic instability, could be lower GI source but significant suspicion for recurrent/persistent ulcer with bleed. Plan:  Will perform endoscopy today, if negative consider pooled RBC scan. Otherwise PPI transfuse as necessary. Marissah Vandemark C 12/20/2014, 1:25 PM  Pager [phone] If no answer or after 5 PM call [phone]

## 2014-12-20 NOTE — ED Provider Notes (Signed)
CSN: [id number]     Arrival date & time 12/20/14  6073 History   First MD Initiated Contact with Patient 12/20/14 [phone]     Chief Complaint  Patient presents with  . Loss of Consciousness     (Consider location/radiation/quality/duration/timing/severity/associated sxs/prior Treatment) Patient is a 81 y.o. female presenting with syncope. The history is provided by the patient.  Loss of Consciousness Associated symptoms: chest pain   Associated symptoms: no headaches, no nausea, no shortness of breath, no vomiting and no weakness    patient had a syncopal episode. States she got up this morning and was going to the kitchen and felt lightheaded and passed out. States she woke up on the floor but was unable to get up for about an hour. She states she wouldn't the room and called a family member. No fevers. States she's been doing well otherwise. Good appetite. No real pain from the fall. States she has had slight chest pain at times. No fevers. No cough. No dysuria. She does have a pacemaker. States she had some stitches removed from getting cancer removed from the skin of her neck. EMS reportedly found a systolic blood pressure of 100 with sitting and just palpable with standing.  Past Medical History  Diagnosis Date  . Hypertension   . Facial rash   . Seborrheic keratoses   . Microscopic hematuria     with negative workup   . Fibrocystic disease of breast   . Anxiety   . Compression fracture of L1 lumbar vertebra   . Eustachian tube dysfunction   . Allergic rhinitis   . Rhomboid muscle pain     spasm  . Depression     situational  . Urinary frequency     with nocturia  . Headache   . UTI (lower urinary tract infection)   . Tendinopathy of rotator cuff   . Arthritis     Left AC joint  . Complication of anesthesia     states allergy to a med used with pacemaker - made her wild   Past Surgical History  Procedure Laterality Date  . Insert / replace / remove pacemaker    .  Esophagogastroduodenoscopy Left 03/22/2014    Procedure: ESOPHAGOGASTRODUODENOSCOPY (EGD);  Surgeon: Arta Silence, MD;  Location: [facility] ENDOSCOPY;  Service: Endoscopy;  Laterality: Left;   Family History  Problem Relation Age of Onset  . Diabetes Mother   . Stroke Mother   . Coronary artery disease Mother   . Cancer - Lung Father   . Hypertension Sister   . Hypertension Brother   . Diabetes Brother   . Cancer Brother   . Hypertension Brother   . Diabetes Brother   . Cancer Brother    Social History  Substance Use Topics  . Smoking status: Never Smoker   . Smokeless tobacco: None  . Alcohol Use: No   OB History    No data available     Review of Systems  Constitutional: Negative for activity change and appetite change.  Eyes: Negative for pain.  Respiratory: Negative for chest tightness and shortness of breath.   Cardiovascular: Positive for chest pain and syncope. Negative for leg swelling.  Gastrointestinal: Negative for nausea, vomiting, abdominal pain and diarrhea.  Genitourinary: Negative for flank pain.  Musculoskeletal: Negative for back pain and neck stiffness.  Skin: Negative for rash.  Neurological: Positive for syncope. Negative for weakness, numbness and headaches.  Psychiatric/Behavioral: Negative for behavioral problems.      Allergies  Clindamycin/lincomycin; Antihistamines, chlorpheniramine-type; Ativan; Imiquimod; Micardis hct; Thiazide-type diuretics; and Ultracet  Home Medications   Prior to Admission medications   Medication Sig Start Date End Date Taking? Authorizing Provider  amLODipine (NORVASC) 10 MG tablet Take 10 mg by mouth daily.   Yes Historical Provider, MD  aspirin 81 MG chewable tablet Chew 81 mg by mouth daily.   Yes Historical Provider, MD  Calcium Carb-Cholecalciferol (CALCIUM 600+D3) 600-800 MG-UNIT TABS Take 1 tablet by mouth daily.   Yes Historical Provider, MD  cholecalciferol (VITAMIN D) 400 UNITS TABS tablet Take 400 Units by  mouth daily.    Yes Historical Provider, MD  losartan (COZAAR) 25 MG tablet Take 25 mg by mouth daily.   Yes Historical Provider, MD  Omega-3 Fatty Acids (FISH OIL) 1200 MG CAPS Take 1,200 mg by mouth daily.   Yes Historical Provider, MD  traMADol (ULTRAM) 50 MG tablet Take 50 mg by mouth every 12 (twelve) hours.   Yes Historical Provider, MD  triamterene-hydrochlorothiazide (MAXZIDE-25) 37.5-25 MG per tablet Take 1.5 tablets by mouth daily. Take 1 & 1/2 tablets by mouth once daily   Yes Historical Provider, MD  vitamin B-12 (CYANOCOBALAMIN) 500 MCG tablet Take 500 mcg by mouth daily.   Yes Historical Provider, MD  amLODipine (NORVASC) 5 MG tablet Take one tablet by mouth once daily Patient not taking: Reported on 12/20/2014 10/29/14   Jettie Booze, MD  lisinopril (PRINIVIL,ZESTRIL) 10 MG tablet Take 1 tablet (10 mg total) by mouth daily. Patient not taking: Reported on 12/20/2014 06/28/14   Jettie Booze, MD   BP 137/51 mmHg  Pulse 66  Temp(Src) 97.6 F (36.4 C) (Oral)  Resp 24  Ht 5' 4.5" (1.638 m)  Wt 125 lb 10.6 oz (57 kg)  BMI 21.24 kg/m2  SpO2 96% Physical Exam  Constitutional: She is oriented to person, place, and time. She appears well-developed and well-nourished.  HENT:  Head: Normocephalic and atraumatic.  Eyes: Pupils are equal, round, and reactive to light.  Neck: Normal range of motion. Neck supple.  Cardiovascular: Normal rate, regular rhythm and normal heart sounds.   No murmur heard. Pulmonary/Chest: Effort normal and breath sounds normal. No respiratory distress. She has no wheezes. She has no rales.  Pacemaker left chest wall.  Abdominal: Soft. Bowel sounds are normal. She exhibits no distension. There is no tenderness. There is no rebound.  Musculoskeletal: Normal range of motion.  Neurological: She is alert and oriented to person, place, and time. No cranial nerve deficit.  Skin: Skin is warm and dry.  Psychiatric: She has a normal mood and affect. Her  speech is normal.  Nursing note and vitals reviewed.   ED Course  Procedures (including critical care time) Labs Review Labs Reviewed  COMPREHENSIVE METABOLIC PANEL - Abnormal; Notable for the following:    Glucose, Bld 112 (*)    BUN 73 (*)    Creatinine, Ser 1.59 (*)    Total Protein 5.2 (*)    Albumin 2.8 (*)    Alkaline Phosphatase 30 (*)    GFR calc non Af Amer 28 (*)    GFR calc Af Amer 32 (*)    All other components within normal limits  CBC WITH DIFFERENTIAL/PLATELET - Abnormal; Notable for the following:    WBC 13.3 (*)    RBC 3.59 (*)    Hemoglobin 10.8 (*)    HCT 31.1 (*)    Neutro Abs 10.3 (*)    Monocytes Absolute 1.2 (*)  All other components within normal limits  CBG MONITORING, ED - Abnormal; Notable for the following:    Glucose-Capillary 106 (*)    All other components within normal limits  POC OCCULT BLOOD, ED - Abnormal; Notable for the following:    Fecal Occult Bld POSITIVE (*)    All other components within normal limits  MRSA PCR SCREENING  URINALYSIS, ROUTINE W REFLEX MICROSCOPIC (NOT AT [facility])  VITAMIN B12  TSH  HEMOGLOBIN A1C  CBC  I-STAT TROPOININ, ED  TYPE AND SCREEN    Imaging Review Dg Chest 2 View  12/20/2014   CLINICAL DATA:  Chest pain for 1 day and acute syncope today.  EXAM: CHEST  2 VIEW  COMPARISON:  05/31/2006 chest radiograph  FINDINGS: Cardiomegaly and left-sided pacemaker again noted.  Patchy left basilar opacity is noted and may represent atelectasis or airspace disease.  There is no evidence of pneumothorax, pleural effusion or consolidation.  A 60% T6 compression fracture and 85% L1 compression fracture are age indeterminate but new since 2008.  IMPRESSION: Patchy left basilar opacity -favor atelectasis over airspace disease. Consider radiographic follow-up in 2-3 weeks.  T6 and L1 compression fractures of uncertain chronicity but new since 2008. Correlate clinically.   Electronically Signed   By: Margarette Canada M.D.   On:  12/20/2014 11:27   I have personally reviewed and evaluated these images and lab results as part of my medical decision-making.   EKG Interpretation   Date/Time:  Friday December 20 2014 10:03:31 EDT Ventricular Rate:  73 PR Interval:  85 QRS Duration: 141 QT Interval:  432 QTC Calculation: 476 R Axis:   139 Text Interpretation:  Atrial-paced complexes Ventricular premature complex  Baseline wander in lead(s) III aVL Confirmed by Alvino Chapel  MD, Ovid Curd  [phone]) on 12/20/2014 10:12:32 AM      MDM   Final diagnoses:  Syncope, unspecified syncope type  Gastrointestinal hemorrhage, unspecified gastritis, unspecified gastrointestinal hemorrhage type  Renal insufficiency    Patient with generalized weakness. Had episode of syncope. Found to have frank blood rectally. He was low but is initially at her baseline however she has had some mild hypotension. Assess with GI. Has had previous bleeding gastric ulcers but the frank red blood goes against that. Could be a very brisk upper GI bleed but she does not appear that ill at this time. Will admit to stepdown bed.       Davonna Belling, MD 12/20/14 1600

## 2014-12-20 NOTE — ED Notes (Signed)
Attempted report 

## 2014-12-20 NOTE — Op Note (Signed)
[facility] [place], [address]   ENDOSCOPY PROCEDURE REPORT  PATIENT: [name], [name]  MR#: [id number] BIRTHDATE: [birth year] , 89  yrs. old GENDER: female ENDOSCOPIST: Teena Irani, MD REFERRED BY: PROCEDURE DATE:  2015/01/12 PROCEDURE: ASA CLASS: INDICATIONS:  hemodynamically destabilizing GI bleeding MEDICATIONS: fentanyl 50 g Versed 4 mg TOPICAL ANESTHETIC: Cetacaine.  DESCRIPTION OF PROCEDURE: After the risks benefits and alternatives of the procedure were thoroughly explained, informed consent was obtained.  The Pentax Gastroscope [id number] endoscope was introduced through the mouth and advanced to the second portion of the duodenum , Without limitations.  The instrument was slowly withdrawn as the mucosa was fully examined. Estimated blood loss is zero unless otherwise noted in this procedure report.    esophagus: Large hiatal hernia otherwise normal Stomach: 2 or 3 small clean-based distal antral ulcers. One giant distal antral ulcer on the greater curvature with a fold separating it from the pylorus. It was largely filled with adherent clot which did not wash off with vigorous lavage and no active bleeding was seen. It was not felt that endoscopic therapy would be useful. CLOtest was obtained. Duodenum: Diffuse duodenitis with's several small superficial clean-based small ulcers or erosions within the bulb The scope was then withdrawn from the patient and the procedure completed.  COMPLICATIONS: There were no immediate complications.  ENDOSCOPIC IMPRESSION: #1 giant prepyloric gastric ulcer with high risk of rebleeding #2 small clean-based antral and duodenal ulcers   and erosions #3 moderately large hiatal hernia  RECOMMENDATIONS: intensive PPI therapy monitoring stools and hemoglobin transfuse as necessary. If rebleeding would go directly to angiogram and surgical consult as backup  REPEAT EXAM:  eSigned:   Teena Irani, MD 01/12/2015 5:40 PM    CC:  CPT CODES: ICD CODES:  The ICD and CPT codes recommended by this software are interpretations from the data that the clinical staff has captured with the software.  The verification of the translation of this report to the ICD and CPT codes and modifiers is the sole responsibility of the health care institution and practicing physician where this report was generated.  [employer] will not be held responsible for the validity of the ICD and CPT codes included on this report.  AMA assumes no liability for data contained or not contained herein. CPT is a Designer, television/film set of the [employer].  PATIENT NAME:  [name], [name] MR#: [id number]

## 2014-12-20 NOTE — ED Notes (Signed)
Pt here from home via [facility].  States she woke up at 0630, walked to kitchen, felt dizzy and passed out.  She believes she was out for about 1 hour.  When she woke up she was unable to stand, so she crawled to the den and called her daughter.  CBG 183.  Pt has demand medtronic pacer, though EMS states not pacing at this time.  EMS stated bp 100/60 in sitting position and no palpable bp on standing.  Pt also became nauseated when moved to standing position.  States some posterior neck pain.

## 2014-12-20 NOTE — Progress Notes (Signed)
GI follow-up. EGD showed very large deep distal gastric ulcer filled with adherent clot and no active bleeding. Multiple small satellite antral and duodenal ulcers numbering probably 5-6. No stigmata of hemorrhage. CLOtest obtained.  Impression: Large NSAID-induced antral ulcer with significant likelihood of destabilizing rebleeding Plan: IV Protonix drip          Await CLOtest          We'll monitor hemoglobin every 6 hours          Transfuse packed red blood cells.          If becomes unstable or obvious rebleed I would favor prompt angiography and surgical consult for failure to radiologically control bleeding.

## 2014-12-21 LAB — COMPREHENSIVE METABOLIC PANEL
ALT: 12 U/L — ABNORMAL LOW (ref 14–54)
ANION GAP: 7 (ref 5–15)
AST: 17 U/L (ref 15–41)
Albumin: 2.4 g/dL — ABNORMAL LOW (ref 3.5–5.0)
Alkaline Phosphatase: 25 U/L — ABNORMAL LOW (ref 38–126)
BUN: 68 mg/dL — ABNORMAL HIGH (ref 6–20)
CHLORIDE: 107 mmol/L (ref 101–111)
CO2: 24 mmol/L (ref 22–32)
Calcium: 9.3 mg/dL (ref 8.9–10.3)
Creatinine, Ser: 1.36 mg/dL — ABNORMAL HIGH (ref 0.44–1.00)
GFR calc non Af Amer: 34 mL/min — ABNORMAL LOW (ref >60)
GFR, EST AFRICAN AMERICAN: 39 mL/min — AB (ref >60)
Glucose, Bld: 104 mg/dL — ABNORMAL HIGH (ref 65–99)
Potassium: 4.2 mmol/L (ref 3.5–5.1)
SODIUM: 138 mmol/L (ref 135–145)
Total Bilirubin: 0.8 mg/dL (ref 0.3–1.2)
Total Protein: 4.6 g/dL — ABNORMAL LOW (ref 6.5–8.1)

## 2014-12-21 LAB — LIPID PANEL
CHOL/HDL RATIO: 3.4 ratio
Cholesterol: 104 mg/dL (ref 0–200)
HDL: 31 mg/dL — AB (ref >40)
LDL CALC: 48 mg/dL (ref 0–99)
Triglycerides: 126 mg/dL (ref <150)
VLDL: 25 mg/dL (ref 0–40)

## 2014-12-21 LAB — URINE MICROSCOPIC-ADD ON

## 2014-12-21 LAB — CBC
HCT: 27.2 % — ABNORMAL LOW (ref 36.0–46.0)
HCT: 27.3 % — ABNORMAL LOW (ref 36.0–46.0)
HEMATOCRIT: 28.5 % — AB (ref 36.0–46.0)
HEMOGLOBIN: 9.6 g/dL — AB (ref 12.0–15.0)
HEMOGLOBIN: 9.6 g/dL — AB (ref 12.0–15.0)
Hemoglobin: 9.8 g/dL — ABNORMAL LOW (ref 12.0–15.0)
MCH: 29.6 pg (ref 26.0–34.0)
MCH: 29.4 pg (ref 26.0–34.0)
MCH: 29.5 pg (ref 26.0–34.0)
MCHC: 35.3 g/dL (ref 30.0–36.0)
MCHC: 35.2 g/dL (ref 30.0–36.0)
MCHC: 34.4 g/dL (ref 30.0–36.0)
MCV: 84 fL (ref 78.0–100.0)
MCV: 83.7 fL (ref 78.0–100.0)
MCV: 85.8 fL (ref 78.0–100.0)
Platelets: 149 10*3/uL — ABNORMAL LOW (ref 150–400)
Platelets: 167 10*3/uL (ref 150–400)
Platelets: 156 10*3/uL (ref 150–400)
RBC: 3.24 MIL/uL — AB (ref 3.87–5.11)
RBC: 3.26 MIL/uL — AB (ref 3.87–5.11)
RBC: 3.32 MIL/uL — AB (ref 3.87–5.11)
RDW: 14.8 % (ref 11.5–15.5)
RDW: 15 % (ref 11.5–15.5)
RDW: 15.2 % (ref 11.5–15.5)
WBC: 11.1 10*3/uL — ABNORMAL HIGH (ref 4.0–10.5)
WBC: 11 10*3/uL — ABNORMAL HIGH (ref 4.0–10.5)
WBC: 10.9 10*3/uL — AB (ref 4.0–10.5)

## 2014-12-21 LAB — URINALYSIS, ROUTINE W REFLEX MICROSCOPIC
BILIRUBIN URINE: NEGATIVE
Glucose, UA: NEGATIVE mg/dL
KETONES UR: NEGATIVE mg/dL
NITRITE: POSITIVE — AB
PH: 5 (ref 5.0–8.0)
Protein, ur: NEGATIVE mg/dL
SPECIFIC GRAVITY, URINE: 1.016 (ref 1.005–1.030)
UROBILINOGEN UA: 0.2 mg/dL (ref 0.0–1.0)

## 2014-12-21 LAB — TYPE AND SCREEN
ABO/RH(D): A POS
ANTIBODY SCREEN: NEGATIVE
Unit division: 0

## 2014-12-21 LAB — HEMOGLOBIN A1C
Hgb A1c MFr Bld: 5.7 % — ABNORMAL HIGH (ref 4.8–5.6)
Mean Plasma Glucose: 117 mg/dL

## 2014-12-21 LAB — CLOTEST (H. PYLORI), BIOPSY: Helicobacter screen: NEGATIVE

## 2014-12-21 MED ORDER — DEXTROSE 5 % IV SOLN
1.0000 g | INTRAVENOUS | Status: AC
  Administered 2014-12-21 – 2014-12-27 (×7): 1 g via INTRAVENOUS
  Filled 2014-12-21 (×7): qty 10

## 2014-12-21 MED ORDER — PNEUMOCOCCAL VAC POLYVALENT 25 MCG/0.5ML IJ INJ
0.5000 mL | INJECTION | INTRAMUSCULAR | Status: DC
  Filled 2014-12-21 (×3): qty 0.5

## 2014-12-21 NOTE — Evaluation (Signed)
Physical Therapy Evaluation Patient Details Name: [name] MRN: [id number] DOB: 1925/01/06 Today's Date: 12/21/2014   History of Present Illness  [name] is a 81 y.o. female with a past medical history significant for hypertension, dyslipidemia, chronic back pain and history of skin cancer; who presented to the ED secondary to syncopal episode and GIB.  Clinical Impression  [name] is moving well today without dizziness or weakness. Pt with unsteady gait without DME and educated for RW use as well as benefit of OPPT. Pt will benefit from acute therapy to maximize mobility, balance and gait to decrease fall risk and return pt to independent function.     Follow Up Recommendations Outpatient PT    Equipment Recommendations  None recommended by PT    Recommendations for Other Services       Precautions / Restrictions Precautions Precautions: Fall      Mobility  Bed Mobility Overal bed mobility: Modified Independent                Transfers Overall transfer level: Needs assistance   Transfers: Sit to/from Stand Sit to Stand: Supervision         General transfer comment: cues for hand placement, pt automatically reaching for environmental support with standing  Ambulation/Gait Ambulation/Gait assistance: Supervision Ambulation Distance (Feet): 500 Feet Assistive device: Rolling walker (2 wheeled) Gait Pattern/deviations: Step-through pattern;Decreased stride length   Gait velocity interpretation: at or above normal speed for age/gender General Gait Details: cues for position in RW and safety, no LOB or difficulty with gait with RW use  Stairs            Wheelchair Mobility    Modified Rankin (Stroke Patients Only)       Balance Overall balance assessment: Needs assistance   Sitting balance-Leahy Scale: Good       Standing balance-Leahy Scale: Fair                               Pertinent Vitals/Pain  Pain Assessment: No/denies pain  HR 61 sats 97% on RA BP 152/68    Home Living Family/patient expects to be discharged to:: Private residence Living Arrangements: Alone Available Help at Discharge: Family;Available PRN/intermittently Type of Home: House Home Access: Stairs to enter   Entrance Stairs-Number of Steps: 1 Home Layout: One level Home Equipment: Walker - 2 wheels;Cane - single point;Toilet riser;Grab bars - tub/shower      Prior Function Level of Independence: Independent         Comments: Pt drives, does her own yard work including push mowing.       Hand Dominance        Extremity/Trunk Assessment   Upper Extremity Assessment: Overall WFL for tasks assessed           Lower Extremity Assessment: Overall WFL for tasks assessed      Cervical / Trunk Assessment: Normal  Communication   Communication: HOH  Cognition Arousal/Alertness: Awake/alert Behavior During Therapy: WFL for tasks assessed/performed Overall Cognitive Status: Within Functional Limits for tasks assessed                      General Comments      Exercises        Assessment/Plan    PT Assessment Patient needs continued PT services  PT Diagnosis Difficulty walking   PT Problem List Decreased activity tolerance;Decreased balance;Decreased knowledge of use of DME  PT Treatment Interventions Gait training;DME instruction;Functional mobility training;Balance training;Therapeutic activities;Patient/family education;Neuromuscular re-education   PT Goals (Current goals can be found in the Care Plan section) Acute Rehab PT Goals Patient Stated Goal: return home PT Goal Formulation: With patient/family Time For Goal Achievement: 12/28/14 Potential to Achieve Goals: Good    Frequency Min 3X/week   Barriers to discharge Decreased caregiver support      Co-evaluation               End of Session   Activity Tolerance: Patient tolerated treatment well Patient  left: in chair;with call bell/phone within reach;with family/visitor present;with nursing/sitter in room Nurse Communication: Mobility status         Time: 1749-4496 PT Time Calculation (min) (ACUTE ONLY): 18 min   Charges:   PT Evaluation $Initial PT Evaluation Tier I: 1 Procedure     PT G CodesMelford Aase 12/21/2014, 10:48 AM  Elwyn Reach, [place]

## 2014-12-21 NOTE — Progress Notes (Signed)
[facility] PROGRESS NOTE Subjective No gross bleeding Pt states was taking a lot of aleve  Objective: Vital signs in last 24 hours: Temp:  [97.5 F (36.4 C)-98.2 F (36.8 C)] 98.1 F (36.7 C) (08/27 0838) Pulse Rate:  [55-139] 78 (08/27 0838) Resp:  [13-36] 24 (08/27 0838) BP: (61-155)/(33-102) 118/58 mmHg (08/27 0838) SpO2:  [96 %-100 %] 98 % (08/27 0838) Weight:  [57 kg (125 lb 10.6 oz)-58.968 kg (130 lb)] 58.1 kg (128 lb 1.4 oz) (08/27 0311) Last BM Date: 12/20/14  Intake/Output from previous day: 08/26 0701 - 08/27 0700 In: 987.5 [P.O.:120; I.V.:532.5; Blood:335] Out: 750 [Urine:750] Intake/Output this shift:    PE: General--NAD  Abdomen--nontender Lab Results:  Recent Labs  12/20/14 1036 12/20/14 1902 12/21/14 0533  WBC 13.3*  --  11.1*  HGB 10.8* 9.5* 9.6*  HCT 31.1* 27.2* 27.2*  PLT 179  --  149*   BMET  Recent Labs  12/20/14 1036 12/21/14 0533  NA 138 138  K 4.8 4.2  CL 108 107  CO2 24 24  CREATININE 1.59* 1.36*   LFT  Recent Labs  12/20/14 1036 12/21/14 0533  PROT 5.2* 4.6*  AST 18 17  ALT 14 12*  ALKPHOS 30* 25*  BILITOT 0.5 0.8   PT/INR No results for input(s): LABPROT, INR in the last 72 hours. PANCREAS No results for input(s): LIPASE in the last 72 hours.       Studies/Results: Dg Chest 2 View  12/20/2014   CLINICAL DATA:  Chest pain for 1 day and acute syncope today.  EXAM: CHEST  2 VIEW  COMPARISON:  05/31/2006 chest radiograph  FINDINGS: Cardiomegaly and left-sided pacemaker again noted.  Patchy left basilar opacity is noted and may represent atelectasis or airspace disease.  There is no evidence of pneumothorax, pleural effusion or consolidation.  A 60% T6 compression fracture and 85% L1 compression fracture are age indeterminate but new since 2008.  IMPRESSION: Patchy left basilar opacity -favor atelectasis over airspace disease. Consider radiographic follow-up in 2-3 weeks.  T6 and L1 compression fractures of  uncertain chronicity but new since 2008. Correlate clinically.   Electronically Signed   By: Margarette Canada M.D.   On: 12/20/2014 11:27    Medications: I have reviewed the patient's current medications.  Assessment/Plan: 1. GI Bleed. Large GU. Still at risk of rebleeding. Pt NPO, will allow clears and continue protonix infusion.    Jenea Dake JR,Maniah Nading L 12/21/2014, 8:50 AM  Pager: [phone] If no answer or after hours call [phone]

## 2014-12-21 NOTE — Progress Notes (Signed)
Patient ID: [name], female   DOB: 1924-07-06, 81 y.o.   MRN: [id number]  [facility] PROGRESS NOTE  [name] OEV:[id number] DOB: [birth year] DOA: 12/20/2014 PCP: Maria Screws, MD   Brief narrative:    81 y.o. female with HTN, HLD, hx of skin cancer, present to [facility] ED August 26th, 2016 for evaluation of syncopal episode that occurred morning prior to this admission. She explained, she was unable to get up and was only able to crawl towards telephone to call her daughter. Daughter noted some blood in pt's underwear. Daughter did report that pt has been taking Ibuprofen for pain in the past 2 weeks.  In ED, pt noted to be somnolent and with initial BP 61/33, HR up to 130's, RR up to 36. Blood work notable for WBC 11 K, Hg 9.5, Cr 1.36. [facility] asked to admit for further evaluation and GI team consulted for assistance.   Assessment/Plan:    Principal Problem:   Syncope - secondary to acute GI bleed - once more medically stable, will request PT evaluation prior to discharge  Active Problems:   Acute GI bleed, acute blood loss anemia  - secondary to large prepyloric gastric ulcer noted on EGD 12/20/2014 - high risk of re bleeding, Hg is currently stable - recommendation is to continue intensive PPI therapy and monitoring H/H - may need surgical evaluation if persistent bleeding  - CBC in AM   Acute thrombocytopenia - from acute blood loss anemia outlined above - Plt now WNL, repeat CBC in AM   Acute kidney injury - secondary to acute bleeding - Cr is trending down - will repeat BMP in AM   Complete heart block s/p PPM 2009 - currently asymptomatic - monitored on telemetry    Essential hypertension - reasonable inpatient control    Leukocytosis - unclear etiology, UA 8/26 suggestive of UTI - will place on empiric Rocephin and will request urine culture  - repeat CBC in AM   Hyperglycemia - reasonable inpatient control - A1C 5.7  DVT prophylaxis -  SCD's  Code Status: Full.  Family Communication:  plan of care discussed with the patient Disposition Plan: to be determined, not ready for d/c yet   IV access:  Peripheral IV  Procedures and diagnostic studies:    Dg Chest 2 View 12/20/2014   Patchy left basilar opacity -favor atelectasis over airspace disease. Consider radiographic follow-up in 2-3 weeks.  T6 and L1 compression fractures of uncertain chronicity but new since 2008.  EGD done by Dr. [name] 12/20/2014 ENDOSCOPIC IMPRESSION: #1 giant prepyloric gastric ulcer with high risk of rebleeding #2 small clean-based antral and duodenal ulcers and erosions #3 moderately large hiatal hernia  RECOMMENDATIONS: intensive PPI therapy monitoring stools and hemoglobin transfuse as necessary. If rebleeding would go directly to angiogram and surgical consult as backup   Medical Consultants:  GI  Other Consultants:  PT  Nutritionist   IAnti-Infectives:   Rocephin 8/27 -->  [name], MD  [facility] Pager [phone]  If 7PM-7AM, please contact night-coverage [email or web address] Password [facility] 12/21/2014, 3:16 PM   LOS: 1 day   HPI/Subjective: No events overnight.   Objective: Filed Vitals:   12/21/14 1000 12/21/14 1045 12/21/14 1100 12/21/14 1200  BP: 140/51 152/68 166/48 147/130  Pulse: 64 66 62 79  Temp:    97.2 F (36.2 C)  TempSrc:    Oral  Resp: 16  18 20   Height:      Weight:  SpO2: 96% 99% 100% 100%    Intake/Output Summary (Last 24 hours) at 12/21/14 1516 Last data filed at 12/21/14 1200  Gross per 24 hour  Intake  987.5 ml  Output   1550 ml  Net -562.5 ml    Exam:   General:  Pt is alert, follows commands appropriately, not in acute distress  Cardiovascular: Regular rate and rhythm, SEM 2/6, no rubs, no gallops  Respiratory: Clear to auscultation bilaterally, no wheezing  Abdomen: Soft, non tender, non distended, bowel sounds present, no guarding  Extremities: No edema, pulses DP and PT  palpable bilaterally  Data Reviewed: Basic Metabolic Panel:  Recent Labs Lab 12/20/14 1036 12/21/14 0533  NA 138 138  K 4.8 4.2  CL 108 107  CO2 24 24  GLUCOSE 112* 104*  BUN 73* 68*  CREATININE 1.59* 1.36*  CALCIUM 9.8 9.3   Liver Function Tests:  Recent Labs Lab 12/20/14 1036 12/21/14 0533  AST 18 17  ALT 14 12*  ALKPHOS 30* 25*  BILITOT 0.5 0.8  PROT 5.2* 4.6*  ALBUMIN 2.8* 2.4*   CBC:  Recent Labs Lab 12/20/14 1036 12/20/14 1902 12/21/14 0533 12/21/14 0950  WBC 13.3*  --  11.1* 11.0*  NEUTROABS 10.3*  --   --   --   HGB 10.8* 9.5* 9.6* 9.6*  HCT 31.1* 27.2* 27.2* 27.3*  MCV 86.6  --  84.0 83.7  PLT 179  --  149* 167   CBG:  Recent Labs Lab 12/20/14 1010  GLUCAP 106*    Recent Results (from the past 240 hour(s))  MRSA PCR Screening     Status: None   Collection Time: 12/20/14  3:22 PM  Result Value Ref Range Status   MRSA by PCR NEGATIVE NEGATIVE Final    Comment:        The GeneXpert MRSA Assay (FDA approved for NASAL specimens only), is one component of a comprehensive MRSA colonization surveillance program. It is not intended to diagnose MRSA infection nor to guide or monitor treatment for MRSA infections.      Scheduled Meds: . sodium chloride   Intravenous Once  . cyanocobalamin  500 mcg Oral Daily  . [START ON 12/23/2014] pantoprazole (PROTONIX) IV  40 mg Intravenous Q12H  . [START ON 12/22/2014] pneumococcal 23 valent vaccine  0.5 mL Intramuscular Tomorrow-1000   Continuous Infusions: . pantoprozole (PROTONIX) infusion 8 mg/hr (12/21/14 1200)

## 2014-12-22 ENCOUNTER — Inpatient Hospital Stay (HOSPITAL_COMMUNITY): Payer: Medicare Other

## 2014-12-22 ENCOUNTER — Encounter (HOSPITAL_COMMUNITY): Payer: Self-pay | Admitting: Radiology

## 2014-12-22 DIAGNOSIS — I639 Cerebral infarction, unspecified: Secondary | ICD-10-CM

## 2014-12-22 LAB — CBC WITH DIFFERENTIAL/PLATELET
BASOS ABS: 0 10*3/uL (ref 0.0–0.1)
BASOS PCT: 0 % (ref 0–1)
Eosinophils Absolute: 0.2 10*3/uL (ref 0.0–0.7)
Eosinophils Relative: 2 % (ref 0–5)
HEMATOCRIT: 28.3 % — AB (ref 36.0–46.0)
HEMOGLOBIN: 9.9 g/dL — AB (ref 12.0–15.0)
LYMPHS PCT: 14 % (ref 12–46)
Lymphs Abs: 1.2 10*3/uL (ref 0.7–4.0)
MCH: 29.9 pg (ref 26.0–34.0)
MCHC: 35 g/dL (ref 30.0–36.0)
MCV: 85.5 fL (ref 78.0–100.0)
Monocytes Absolute: 0.7 10*3/uL (ref 0.1–1.0)
Monocytes Relative: 8 % (ref 3–12)
NEUTROS ABS: 7 10*3/uL (ref 1.7–7.7)
NEUTROS PCT: 76 % (ref 43–77)
Platelets: 162 10*3/uL (ref 150–400)
RBC: 3.31 MIL/uL — AB (ref 3.87–5.11)
RDW: 15.1 % (ref 11.5–15.5)
WBC: 9.1 10*3/uL (ref 4.0–10.5)

## 2014-12-22 LAB — CBC
HEMATOCRIT: 26.5 % — AB (ref 36.0–46.0)
Hemoglobin: 9.3 g/dL — ABNORMAL LOW (ref 12.0–15.0)
MCH: 29.8 pg (ref 26.0–34.0)
MCHC: 35.1 g/dL (ref 30.0–36.0)
MCV: 84.9 fL (ref 78.0–100.0)
PLATELETS: 151 10*3/uL (ref 150–400)
RBC: 3.12 MIL/uL — ABNORMAL LOW (ref 3.87–5.11)
RDW: 15.1 % (ref 11.5–15.5)
WBC: 10.5 10*3/uL (ref 4.0–10.5)

## 2014-12-22 LAB — BASIC METABOLIC PANEL
Anion gap: 8 (ref 5–15)
BUN: 41 mg/dL — ABNORMAL HIGH (ref 6–20)
CALCIUM: 9.4 mg/dL (ref 8.9–10.3)
CO2: 24 mmol/L (ref 22–32)
CREATININE: 1.11 mg/dL — AB (ref 0.44–1.00)
Chloride: 106 mmol/L (ref 101–111)
GFR, EST AFRICAN AMERICAN: 50 mL/min — AB (ref >60)
GFR, EST NON AFRICAN AMERICAN: 43 mL/min — AB (ref >60)
GLUCOSE: 105 mg/dL — AB (ref 65–99)
Potassium: 4 mmol/L (ref 3.5–5.1)
Sodium: 138 mmol/L (ref 135–145)

## 2014-12-22 LAB — HEMOGLOBIN AND HEMATOCRIT, BLOOD
HCT: 26.7 % — ABNORMAL LOW (ref 36.0–46.0)
HEMATOCRIT: 28 % — AB (ref 36.0–46.0)
HEMOGLOBIN: 9.4 g/dL — AB (ref 12.0–15.0)
Hemoglobin: 9.7 g/dL — ABNORMAL LOW (ref 12.0–15.0)

## 2014-12-22 MED ORDER — STROKE: EARLY STAGES OF RECOVERY BOOK
Freq: Once | Status: DC
  Filled 2014-12-22: qty 1

## 2014-12-22 MED ORDER — HYDRALAZINE HCL 20 MG/ML IJ SOLN
5.0000 mg | Freq: Three times a day (TID) | INTRAMUSCULAR | Status: DC | PRN
  Administered 2014-12-23 (×2): 5 mg via INTRAVENOUS
  Filled 2014-12-22 (×2): qty 1

## 2014-12-22 MED ORDER — HYDRALAZINE HCL 10 MG PO TABS
10.0000 mg | ORAL_TABLET | Freq: Three times a day (TID) | ORAL | Status: DC
  Administered 2014-12-22 – 2014-12-23 (×4): 10 mg via ORAL
  Filled 2014-12-22 (×4): qty 1

## 2014-12-22 NOTE — Progress Notes (Addendum)
Pt OOB to [facility] with assist x 1; upon returning to bed pt with expressive aphasia-having difficulty finding the words to tell me how she wanted head of bed to be adjusted; upon further evaluation; pt noted to have some expressive and receptive aphasia and left sided facial droop noted; charge RN aware and rapid response RN called; after consulting with neuro MD, code stroke called by rapid response nurse; pt denies pain or discomfort, lightheadedness, and only complains of feeling "dumb" with difficulty expressing herself/jumbling her words; MD at bedside; pt transferred to CT scan with RN x 2; pt daughter called and message left; will continue to closely monitor

## 2014-12-22 NOTE — Consult Note (Signed)
Neurology Consultation Reason for Consult: Aphasia Referring Physician: Doyle Askew, I  CC: Aphasia  History is obtained from: Patient  HPI: [name] is a 81 y.o. female admitted with GI bleed he was having her bed adjusted earlier when suddenly she became unable to express what she was trying to say. She states that she could think of the words but just couldn't say them. On rapid responses initial exam, she appeared to have some difficulty understanding as well. There is also noted a left-sided facial droop and a code stroke was called.  Her symptoms rapidly improved, and at the point of my evaluation she no longer had evidence of aphasia but did still have a mild left-sided facial droop. She denies any lightheadedness during the event, but did feel "dumb."   LKW: 4098 tpa given?: no, active bleeding    ROS: A 14 point ROS was performed and is negative except as noted in the HPI.  Past Medical History  Diagnosis Date  . Hypertension   . Facial rash   . Seborrheic keratoses   . Microscopic hematuria     with negative workup   . Fibrocystic disease of breast   . Anxiety   . Compression fracture of L1 lumbar vertebra   . Eustachian tube dysfunction   . Allergic rhinitis   . Rhomboid muscle pain     spasm  . Depression     situational  . Urinary frequency     with nocturia  . Headache   . UTI (lower urinary tract infection)   . Tendinopathy of rotator cuff   . Arthritis     Left AC joint  . Complication of anesthesia     states allergy to a med used with pacemaker - made her wild     Family History  Problem Relation Age of Onset  . Diabetes Mother   . Stroke Mother   . Coronary artery disease Mother   . Cancer - Lung Father   . Hypertension Sister   . Hypertension Brother   . Diabetes Brother   . Cancer Brother   . Hypertension Brother   . Diabetes Brother   . Cancer Brother      Social History:  reports that she has never smoked. She does not have  any smokeless tobacco history on file. She reports that she does not drink alcohol or use illicit drugs.   Exam: Current vital signs: BP 179/71 mmHg  Pulse 76  Temp(Src) 97.1 F (36.2 C) (Oral)  Resp 21  Ht 5' 4.5" (1.638 m)  Wt 58 kg (127 lb 13.9 oz)  BMI 21.62 kg/m2  SpO2 100% Vital signs in last 24 hours: Temp:  [97.1 F (36.2 C)-98.4 F (36.9 C)] 97.1 F (36.2 C) (08/28 1128) Pulse Rate:  [46-76] 76 (08/28 1128) Resp:  [16-26] 21 (08/28 1128) BP: (131-179)/(37-125) 179/71 mmHg (08/28 1128) SpO2:  [96 %-100 %] 100 % (08/28 1128) Weight:  [58 kg (127 lb 13.9 oz)] 58 kg (127 lb 13.9 oz) (08/28 0316)   Physical Exam  Constitutional: Appears well-developed and well-nourished.  Psych: Affect appropriate to situation Eyes: No scleral injection HENT: No OP obstrucion Head: Normocephalic.  Cardiovascular: Normal rate and regular rhythm.  Respiratory: Effort normal and breath sounds normal to anterior ascultation GI: Soft.  No distension. There is no tenderness.  Skin: WDI  Neuro: Mental Status: Patient is awake, alert, oriented to person, place, month, year, and situation. Patient is able to give a clear and coherent  history. No signs of aphasia or neglect Cranial Nerves: II: Visual Fields are full. Pupils are equal, round, and reactive to light.   III,IV, VI: EOMI without ptosis or diploplia.  V: Facial sensation is symmetric to temperature VII: Facial movement is notable for decreased movement of the left lower face.  VIII: hearing is intact to voice X: Uvula elevates symmetrically Motor: Tone is normal. Bulk is normal. 5/5 strength was present in all four extremities.  Sensory: Sensation is symmetric to light touch in the arms and legs. Cerebellar: FNF  intact bilaterally    I have reviewed labs in epic and the results pertinent to this consultation are: LDL- 48 a1c 5.7  I have reviewed the images obtained: CT head - no acute findings.   Impression: 81 yo  F with transient aphasia and left facial droop. The fact that it is left sided droop is unusual, but the description of the aphasia is fairly convincing. She takes ASA at baseline but is not taking it due to her current bleeding. Once stabilized, further risk vs benefit evaluation will need to be considered.   Recommendations: 1. Repeat CT in 24 - 48 hours.  2. Frequent neuro checks 3. Echocardiogram 4. Carotid dopplers 6. Prophylactic therapy-None, active GI bleeding.  7. Telemetry monitoring   Roland Rack, MD [facility] [phone]  If 7pm- 7am, please page neurology on call as listed in [place].

## 2014-12-22 NOTE — Progress Notes (Signed)
Patient ID: [name], female   DOB: 1924/06/20, 81 y.o.   MRN: [id number]  [facility] PROGRESS NOTE  [name] ERD:[id number] DOB: April 20, 1925 DOA: 12/20/2014 PCP: [name], MD   Brief narrative:    81 y.o. female with HTN, HLD, hx of skin cancer, present to [facility] ED August 26th, 2016 for evaluation of syncopal episode that occurred morning prior to this admission. She explained, she was unable to get up and was only able to crawl towards telephone to call her daughter. Daughter noted some blood in pt's underwear. Daughter did report that pt has been taking Ibuprofen for pain in the past 2 weeks.  In ED, pt noted to be somnolent and with initial BP 61/33, HR up to 130's, RR up to 36. Blood work notable for WBC 11 K, Hg 9.5, Cr 1.36. TRH asked to admit for further evaluation and GI team consulted for assistance.   Assessment/Plan:    Principal Problem:   Syncope - secondary to acute GI bleed - PT evaluation requested  Active Problems:   Acute GI bleed, acute blood loss anemia  - secondary to large prepyloric gastric ulcer noted on EGD 12/20/2014 - high risk of re bleeding, Hg is currently stable - recommendation is to continue intensive PPI therapy and monitoring H/H - advance diet to full and if Hg still stable in AM, can change to PO meds per GI team  - had positive antibody for H. pylori but urease test was negative indicating no active infection - Pt will need to see Dr. [name] in about 6 weeks to arrange repeat EGD to document healing of the large gastric ulcer.   Acute thrombocytopenia - from acute blood loss anemia outlined above - Plt now WNL, repeat CBC in AM   Acute kidney injury - secondary to acute bleeding - Cr is trending down - diet advanced to full  - will repeat BMP in AM   Complete heart block s/p PPM 2009 - currently asymptomatic - monitored on telemetry    Accelerated hypertension - place on Hydralazine 10 mg TID and add  hydralazine as needed    Leukocytosis - unclear etiology, UA 8/26 suggestive of UTI - placed on empiric Rocephin 8/27, urine culture with E. Coli but final culture pending  - transition to oral ABX as soon as possible and based on urine culture report    Hyperglycemia - reasonable inpatient control - A1C 5.7  DVT prophylaxis - SCD's  Code Status: Full.  Family Communication:  plan of care discussed with the patient Disposition Plan: to be determined, not ready for d/c yet, PT eval requested, plan is to advance diet to full, possibly transition to oral meds in AM and d/c in next 24 - 48 hours   IV access:  Peripheral IV  Procedures and diagnostic studies:    Dg Chest 2 View 12/20/2014   Patchy left basilar opacity -favor atelectasis over airspace disease. Consider radiographic follow-up in 2-3 weeks.  T6 and L1 compression fractures of uncertain chronicity but new since 2008.  EGD done by Dr. [name] 12/20/2014 ENDOSCOPIC IMPRESSION: #1 giant prepyloric gastric ulcer with high risk of rebleeding #2 small clean-based antral and duodenal ulcers and erosions #3 moderately large hiatal hernia  RECOMMENDATIONS: intensive PPI therapy monitoring stools and hemoglobin transfuse as necessary. If rebleeding would go directly to angiogram and surgical consult as backup   Medical Consultants:  GI  Other Consultants:  PT  Nutritionist   IAnti-Infectives:   Rocephin  8/27 -->  [name], MD  [facility] Pager [phone]  If 7PM-7AM, please contact night-coverage [email or web address] Password [facility] 12/22/2014, 2:27 PM   LOS: 2 days   HPI/Subjective: No events overnight.   Objective: Filed Vitals:   12/22/14 0800 12/22/14 0900 12/22/14 1000 12/22/14 1128  BP: 174/63   179/71  Pulse: 71 67 60 76  Temp: 97.7 F (36.5 C)   97.1 F (36.2 C)  TempSrc: Oral   Oral  Resp: 26 22 22 21   Height:      Weight:      SpO2: 98% 100% 98% 100%    Intake/Output Summary (Last 24 hours) at  12/22/14 1427 Last data filed at 12/22/14 1129  Gross per 24 hour  Intake    905 ml  Output   1650 ml  Net   -745 ml    Exam:   General:  Pt is alert, follows commands appropriately, not in acute distress  Cardiovascular: Regular rate and rhythm, SEM 2/6, no rubs, no gallops  Respiratory: Clear to auscultation bilaterally, no wheezing  Abdomen: Soft, non tender, non distended, bowel sounds present, no guarding  Extremities: No edema, pulses DP and PT palpable bilaterally  Data Reviewed: Basic Metabolic Panel:  Recent Labs Lab 12/20/14 1036 12/21/14 0533 12/22/14 0655  NA 138 138 138  K 4.8 4.2 4.0  CL 108 107 106  CO2 24 24 24   GLUCOSE 112* 104* 105*  BUN 73* 68* 41*  CREATININE 1.59* 1.36* 1.11*  CALCIUM 9.8 9.3 9.4   Liver Function Tests:  Recent Labs Lab 12/20/14 1036 12/21/14 0533  AST 18 17  ALT 14 12*  ALKPHOS 30* 25*  BILITOT 0.5 0.8  PROT 5.2* 4.6*  ALBUMIN 2.8* 2.4*   CBC:  Recent Labs Lab 12/20/14 1036  12/21/14 0533 12/21/14 0950 12/21/14 1819 12/22/14 0107 12/22/14 0655 12/22/14 1132  WBC 13.3*  --  11.1* 11.0* 10.9* 10.5  --   --   NEUTROABS 10.3*  --   --   --   --   --   --   --   HGB 10.8*  < > 9.6* 9.6* 9.8* 9.3* 9.7* 9.4*  HCT 31.1*  < > 27.2* 27.3* 28.5* 26.5* 28.0* 26.7*  MCV 86.6  --  84.0 83.7 85.8 84.9  --   --   PLT 179  --  149* 167 156 151  --   --   < > = values in this interval not displayed. CBG:  Recent Labs Lab 12/20/14 1010  GLUCAP 106*    Recent Results (from the past 240 hour(s))  MRSA PCR Screening     Status: None   Collection Time: 12/20/14  3:22 PM  Result Value Ref Range Status   MRSA by PCR NEGATIVE NEGATIVE Final    Comment:        The GeneXpert MRSA Assay (FDA approved for NASAL specimens only), is one component of a comprehensive MRSA colonization surveillance program. It is not intended to diagnose MRSA infection nor to guide or monitor treatment for MRSA infections.   Urine  culture     Status: None (Preliminary result)   Collection Time: 12/21/14  9:48 PM  Result Value Ref Range Status   Specimen Description URINE, CLEAN CATCH  Final   Special Requests NONE  Final   Culture >=100,000 COLONIES/mL ESCHERICHIA COLI  Final   Report Status PENDING  Incomplete     Scheduled Meds: . cefTRIAXone (ROCEPHIN)  IV  1 g Intravenous Q24H  .  cyanocobalamin  500 mcg Oral Daily  . [START ON 12/23/2014] pantoprazole (PROTONIX) IV  40 mg Intravenous Q12H  . pneumococcal 23 valent vaccine  0.5 mL Intramuscular Tomorrow-1000   Continuous Infusions: . pantoprozole (PROTONIX) infusion 8 mg/hr (12/21/14 2300)

## 2014-12-22 NOTE — Progress Notes (Signed)
Called by RN for patient with new onset expressive aphasia and left facial droop.  Upon my arrival to patients bedside, Patient noted to have expressive aphasia with some receptive aphasia and facial droop.  LSN 1345, NIHSS 4.  Code stroke called at 1305.  Patients aphasia rapidly improved by the time Dr. Leonel Ramsay arrived at bedside still with facial droop. Patient was transported to Ct scan via bed on monitor with Rn.  Patient here with GI Bleed.

## 2014-12-22 NOTE — Progress Notes (Signed)
[facility] PROGRESS NOTE Subjective patient tolerating clear liquids no further signs of bleeding.  Objective: Vital signs in last 24 hours: Temp:  [97.1 F (36.2 C)-98.4 F (36.9 C)] 97.1 F (36.2 C) (08/28 1128) Pulse Rate:  [46-76] 76 (08/28 1128) Resp:  [16-26] 21 (08/28 1128) BP: (131-179)/(37-125) 179/71 mmHg (08/28 1128) SpO2:  [96 %-100 %] 100 % (08/28 1128) Weight:  [58 kg (127 lb 13.9 oz)] 58 kg (127 lb 13.9 oz) (08/28 0316) Last BM Date: 12/20/14  Intake/Output from previous day: 08/27 0701 - 08/28 0700 In: 1345 [P.O.:980; I.V.:315; IV Piggyback:50] Out: 1800 [Urine:1800] Intake/Output this shift: Total I/O In: 340 [P.O.:240; I.V.:100] Out: 650 [Urine:650]  PE:  Abdomen-- soft and nontender  Lab Results:  Recent Labs  12/20/14 1036  12/21/14 0533 12/21/14 0950 12/21/14 1819 12/22/14 0107 12/22/14 0655 12/22/14 1132  WBC 13.3*  --  11.1* 11.0* 10.9* 10.5  --   --   HGB 10.8*  < > 9.6* 9.6* 9.8* 9.3* 9.7* 9.4*  HCT 31.1*  < > 27.2* 27.3* 28.5* 26.5* 28.0* 26.7*  PLT 179  --  149* 167 156 151  --   --   < > = values in this interval not displayed. BMET  Recent Labs  12/20/14 1036 12/21/14 0533 12/22/14 0655  NA 138 138 138  K 4.8 4.2 4.0  CL 108 107 106  CO2 24 24 24   CREATININE 1.59* 1.36* 1.11*   LFT  Recent Labs  12/20/14 1036 12/21/14 0533  PROT 5.2* 4.6*  AST 18 17  ALT 14 12*  ALKPHOS 30* 25*  BILITOT 0.5 0.8   PT/INR No results for input(s): LABPROT, INR in the last 72 hours. PANCREAS No results for input(s): LIPASE in the last 72 hours.       Studies/Results: No results found.  Medications: I have reviewed the patient's current medications.  Assessment/Plan: 1. G.I. bleed from large gastric ulcer. We'll go ahead and advanced full liquid diet and if hemoglobin doing well tomorrow I could switch over to oral medications. She will need to be discharged on PPI BID. Patient had positive antibody for H.  pylori but urease test was negative indicating no active infection. Patient will need to see Dr. Teena Irani back in the office in about 6 weeks to arrange repeating EGD document healing of this large gastric ulcer.   Avanthika Dehnert JR,Colbe Viviano L 12/22/2014, 1:22 PM  Pager: [phone] If no answer or after hours call [phone]

## 2014-12-22 NOTE — Progress Notes (Signed)
Dr Oletta Lamas GI called regarding 455ml maroon liquid/coffee ground appearing from rectum this afternoon; orders received to collect labs and call with hemoglobin less than 9; will continue to closely monitor

## 2014-12-22 NOTE — Progress Notes (Signed)
[facility] stroke called, neurology at bedside, cancer transfer to telemtry.  [name], MD  [facility] Pager [phone]  If 7PM-7AM, please contact night-coverage [email or web address] Password [email or web address]

## 2014-12-23 ENCOUNTER — Inpatient Hospital Stay (HOSPITAL_COMMUNITY): Payer: Medicare Other

## 2014-12-23 DIAGNOSIS — I10 Essential (primary) hypertension: Secondary | ICD-10-CM

## 2014-12-23 DIAGNOSIS — G459 Transient cerebral ischemic attack, unspecified: Secondary | ICD-10-CM | POA: Diagnosis present

## 2014-12-23 DIAGNOSIS — N342 Other urethritis: Secondary | ICD-10-CM

## 2014-12-23 DIAGNOSIS — G451 Carotid artery syndrome (hemispheric): Secondary | ICD-10-CM

## 2014-12-23 DIAGNOSIS — I442 Atrioventricular block, complete: Secondary | ICD-10-CM

## 2014-12-23 DIAGNOSIS — G9340 Encephalopathy, unspecified: Secondary | ICD-10-CM | POA: Diagnosis present

## 2014-12-23 DIAGNOSIS — N179 Acute kidney failure, unspecified: Secondary | ICD-10-CM

## 2014-12-23 DIAGNOSIS — K254 Chronic or unspecified gastric ulcer with hemorrhage: Principal | ICD-10-CM

## 2014-12-23 DIAGNOSIS — R4701 Aphasia: Secondary | ICD-10-CM

## 2014-12-23 DIAGNOSIS — I6789 Other cerebrovascular disease: Secondary | ICD-10-CM

## 2014-12-23 LAB — CBC
HEMATOCRIT: 27 % — AB (ref 36.0–46.0)
HEMOGLOBIN: 9.3 g/dL — AB (ref 12.0–15.0)
MCH: 29.2 pg (ref 26.0–34.0)
MCHC: 34.4 g/dL (ref 30.0–36.0)
MCV: 84.9 fL (ref 78.0–100.0)
Platelets: 168 10*3/uL (ref 150–400)
RBC: 3.18 MIL/uL — AB (ref 3.87–5.11)
RDW: 14.9 % (ref 11.5–15.5)
WBC: 7.5 10*3/uL (ref 4.0–10.5)

## 2014-12-23 LAB — BASIC METABOLIC PANEL
ANION GAP: 4 — AB (ref 5–15)
BUN: 25 mg/dL — ABNORMAL HIGH (ref 6–20)
CHLORIDE: 110 mmol/L (ref 101–111)
CO2: 26 mmol/L (ref 22–32)
Calcium: 9.3 mg/dL (ref 8.9–10.3)
Creatinine, Ser: 0.99 mg/dL (ref 0.44–1.00)
GFR calc non Af Amer: 49 mL/min — ABNORMAL LOW (ref >60)
GFR, EST AFRICAN AMERICAN: 57 mL/min — AB (ref >60)
GLUCOSE: 116 mg/dL — AB (ref 65–99)
POTASSIUM: 3.9 mmol/L (ref 3.5–5.1)
Sodium: 140 mmol/L (ref 135–145)

## 2014-12-23 LAB — URINE CULTURE: Culture: >=100000

## 2014-12-23 MED ORDER — PANTOPRAZOLE SODIUM 40 MG PO TBEC
40.0000 mg | DELAYED_RELEASE_TABLET | Freq: Two times a day (BID) | ORAL | Status: DC
  Administered 2014-12-23 – 2014-12-28 (×11): 40 mg via ORAL
  Filled 2014-12-23 (×11): qty 1

## 2014-12-23 MED ORDER — HYDRALAZINE HCL 20 MG/ML IJ SOLN: 10.0000 mg | Freq: Three times a day (TID) | INTRAMUSCULAR | Status: DC | PRN

## 2014-12-23 MED ORDER — HYDRALAZINE HCL 25 MG PO TABS
25.0000 mg | ORAL_TABLET | Freq: Three times a day (TID) | ORAL | Status: DC
  Administered 2014-12-23 – 2014-12-28 (×13): 25 mg via ORAL
  Filled 2014-12-23 (×13): qty 1

## 2014-12-23 MED ORDER — AMLODIPINE BESYLATE 10 MG PO TABS
10.0000 mg | ORAL_TABLET | Freq: Every day | ORAL | Status: DC
  Administered 2014-12-23 – 2014-12-24 (×2): 10 mg via ORAL
  Filled 2014-12-23 (×2): qty 1

## 2014-12-23 NOTE — Care Management Important Message (Signed)
Important Message  Patient Details  Name: [name] MRN: [id number] Date of Birth: March 08, 1925   Medicare Important Message Given:  Yes-second notification given    Pricilla Handler 12/23/2014, 3:13 PM

## 2014-12-23 NOTE — Progress Notes (Signed)
Echocardiogram 2D Echocardiogram has been performed.  Joelene Millin 12/23/2014, 3:19 PM

## 2014-12-23 NOTE — Evaluation (Signed)
Occupational Therapy Evaluation Patient Details Name: [name] MRN: [id number] DOB: 06-11-1924 Today's Date: 12/23/2014    History of Present Illness [name] is a 81 y.o. female with a past medical history significant for hypertension, dyslipidemia, chronic back pain and history of skin cancer; who presented to the ED secondary to syncopal episode and GIB. 12/22/14  pt with aphasia and mild left-sided facial droop.   Clinical Impression   This 81 yo female admitted with above presents to acute OT with decreased balance, decreased mobility, decreased insight into vision deficits (right homonymous hemianopsia) all affecting her PLOF of being independent and living by herself. At this point I cannot recommend she continue driving or cooking due to safety concerns. I am recommending follow up at SNF to see if she can get to the point where she can go home at a [employer] unless family can provide 24 hour S/prn A then she can go home with [place].    Follow Up Recommendations  SNF (unless family can provide 24 hour S/prn A (due to new vision deficits)--not safe to drive or cook) If they can then [facility] would be appropriate)          Precautions / Restrictions Precautions Precautions: Fall Precaution Comments: right homonymous hemianopsia Restrictions Weight Bearing Restrictions: No      Mobility Bed Mobility Overal bed mobility: Needs Assistance Bed Mobility: Supine to Sit;Sit to Supine     Supine to sit: Min guard Sit to supine: Min guard      Transfers Overall transfer level: Needs assistance Equipment used: 1 person hand held assist Transfers: Sit to/from Stand Sit to Stand: Min guard              Balance Overall balance assessment: Needs assistance Sitting-balance support: No upper extremity supported;Feet supported Sitting balance-Leahy Scale: Good       Standing balance-Leahy Scale: Fair                              ADL Overall  ADL's : Needs assistance/impaired Eating/Feeding: Set up;Supervision/ safety;Sitting Eating/Feeding Details (indicate cue type and reason): due to right homonymous hemianopsia Grooming: Set up;Supervision/safety;Sitting Grooming Details (indicate cue type and reason): due to right homonymous hemianopsia Upper Body Bathing: Set up;Supervision/ safety Upper Body Bathing Details (indicate cue type and reason): due to right homonymous hemianopsia Lower Body Bathing: Min guard Lower Body Bathing Details (indicate cue type and reason): due to right homonymous hemianopsia and balance Upper Body Dressing : Set up;Supervision/safety Upper Body Dressing Details (indicate cue type and reason): due to right homonymous hemianopsia Lower Body Dressing: Min guard Lower Body Dressing Details (indicate cue type and reason): due to right homonymous hemianopsia and balance Toilet Transfer: Minimal assistance;Stand-pivot (bed<>recliner)   Toileting- Clothing Manipulation and Hygiene: Min guard Toileting - Clothing Manipulation Details (indicate cue type and reason): due to balance             Vision Vision Assessment?: Yes Eye Alignment: Within Functional Limits Ocular Range of Motion: Within Functional Limits Alignment/Gaze Preference: Within Defined Limits Visual Fields: Right homonymous hemianopsia Additional Comments: Wears bifocals at baseline--pt reports they are not working well and need to be cleaned (I did clean them but they were not that dirty)          Pertinent Vitals/Pain Pain Assessment: No/denies pain     Hand Dominance Right   Extremity/Trunk Assessment Upper Extremity Assessment Upper Extremity Assessment: Overall [facility]  for tasks assessed   Lower Extremity Assessment Lower Extremity Assessment: Overall WFL for tasks assessed   Cervical / Trunk Assessment Cervical / Trunk Assessment: Normal   Communication Communication Communication: HOH   Cognition Arousal/Alertness:  Awake/alert Behavior During Therapy: WFL for tasks assessed/performed Overall Cognitive Status: Impaired/Different from baseline Area of Impairment: Safety/judgement         Safety/Judgement: Decreased awareness of safety;Decreased awareness of deficits (vision)                    Home Living Family/patient expects to be discharged to:: Private residence Living Arrangements: Alone Available Help at Discharge: Family;Available PRN/intermittently Type of Home: House Home Access: Stairs to enter Entrance Stairs-Number of Steps: 1   Home Layout: One level     Bathroom Shower/Tub: Walk-in shower;Door   [employer] Toilet: Standard     Home Equipment: Environmental consultant - 2 wheels;Cane - single point;Toilet riser;Grab bars - tub/shower;Shower seat   Additional Comments: not currently using a the shower seat she has      Prior Functioning/Environment Level of Independence: Independent        Comments: Pt drives, does her own yard work including push mowing.      OT Diagnosis: Generalized weakness;Cognitive deficits;Disturbance of vision   OT Problem List: Decreased strength;Impaired balance (sitting and/or standing);Impaired vision/perception;Decreased cognition   OT Treatment/Interventions: Self-care/ADL training;Patient/family education;Balance training;Visual/perceptual remediation/compensation;DME and/or AE instruction;Cognitive remediation/compensation    OT Goals(Current goals can be found in the care plan section) Acute Rehab OT Goals Patient Stated Goal: to go home OT Goal Formulation: With patient Time For Goal Achievement: 01/06/15 Potential to Achieve Goals: Good  OT Frequency: Min 3X/week   Barriers to D/C: Decreased caregiver support             End of Session    Activity Tolerance: Patient tolerated treatment well Patient left: in bed;with call bell/phone within reach;with bed alarm set   Time: 1432-1450 OT Time Calculation (min): 18 min Charges:  OT  General Charges $OT Visit: 1 Procedure OT Evaluation $Initial OT Evaluation Tier I: 1 Procedure  Almon Register [phone] 12/23/2014, 3:34 PM

## 2014-12-23 NOTE — Significant Event (Signed)
Received call from CT that they cannot do CTA since patient has eaten lunch. Stated that patient needed to be NPO four hours prior to procedure. Rescheduled CTA for 1930. Patient made aware of this.

## 2014-12-23 NOTE — Progress Notes (Signed)
STROKE TEAM PROGRESS NOTE   SUBJECTIVE (INTERVAL HISTORY) Her RN is at the bedside.  Overall she feels her condition is stable. As per RN, the "aphasia" and "L facial droop" episode lasted about "minutes". Resolved after CT head.    OBJECTIVE Temp:  [97.1 F (36.2 C)-98.5 F (36.9 C)] 98.5 F (36.9 C) (08/29 0740) Pulse Rate:  [58-76] 69 (08/29 0740) Cardiac Rhythm:  [-] A-V Sequential paced (08/29 0759) Resp:  [12-24] 20 (08/29 0740) BP: (145-187)/(42-73) 164/64 mmHg (08/29 0740) SpO2:  [93 %-100 %] 97 % (08/29 0740) Weight:  [126 lb 8.7 oz (57.4 kg)] 126 lb 8.7 oz (57.4 kg) (08/29 0330)   Recent Labs Lab 12/20/14 1010  GLUCAP 106*    Recent Labs Lab 12/20/14 1036 12/21/14 0533 12/22/14 0655 12/23/14 0240  NA 138 138 138 140  K 4.8 4.2 4.0 3.9  CL 108 107 106 110  CO2 24 24 24 26   GLUCOSE 112* 104* 105* 116*  BUN 73* 68* 41* 25*  CREATININE 1.59* 1.36* 1.11* 0.99  CALCIUM 9.8 9.3 9.4 9.3    Recent Labs Lab 12/20/14 1036 12/21/14 0533  AST 18 17  ALT 14 12*  ALKPHOS 30* 25*  BILITOT 0.5 0.8  PROT 5.2* 4.6*  ALBUMIN 2.8* 2.4*    Recent Labs Lab 12/20/14 1036  12/21/14 0950 12/21/14 1819 12/22/14 0107 12/22/14 0655 12/22/14 1132 12/22/14 1808 12/23/14 0240  WBC 13.3*  < > 11.0* 10.9* 10.5  --   --  9.1 7.5  NEUTROABS 10.3*  --   --   --   --   --   --  7.0  --   HGB 10.8*  < > 9.6* 9.8* 9.3* 9.7* 9.4* 9.9* 9.3*  HCT 31.1*  < > 27.3* 28.5* 26.5* 28.0* 26.7* 28.3* 27.0*  MCV 86.6  < > 83.7 85.8 84.9  --   --  85.5 84.9  PLT 179  < > 167 156 151  --   --  162 168  < > = values in this interval not displayed. No results for input(s): CKTOTAL, CKMB, CKMBINDEX, TROPONINI in the last 168 hours. No results for input(s): LABPROT, INR in the last 72 hours.  Recent Labs  12/21/14 1217  COLORURINE YELLOW  LABSPEC 1.016  PHURINE 5.0  GLUCOSEU NEGATIVE  HGBUR MODERATE*  BILIRUBINUR NEGATIVE  KETONESUR NEGATIVE  PROTEINUR NEGATIVE  UROBILINOGEN 0.2   NITRITE POSITIVE*  LEUKOCYTESUR SMALL*       Component Value Date/Time   CHOL 104 12/21/2014 0232   TRIG 126 12/21/2014 0232   HDL 31* 12/21/2014 0232   CHOLHDL 3.4 12/21/2014 0232   VLDL 25 12/21/2014 0232   LDLCALC 48 12/21/2014 0232   Lab Results  Component Value Date   HGBA1C 5.7* 12/20/2014   No results found for: LABOPIA, COCAINSCRNUR, LABBENZ, AMPHETMU, THCU, LABBARB  No results for input(s): ETH in the last 168 hours.  I have personally reviewed the radiological images below and agree with the radiology interpretations.  Dg Chest 2 View  12/20/2014   IMPRESSION: Patchy left basilar opacity -favor atelectasis over airspace disease. Consider radiographic follow-up in 2-3 weeks.  T6 and L1 compression fractures of uncertain chronicity but new since 2008. Correlate clinically.   Ct Head Wo Contrast  12/22/2014  IMPRESSION: 1. No evidence of acute intracranial hemorrhage or acute stroke. 2. Atrophy and chronic small vessel ischemic changes.  CTA head and neck  - pending  2D Echocardiogram  pending   PHYSICAL EXAM  Temp:  [  97.1 F (36.2 C)-98.5 F (36.9 C)] 98.5 F (36.9 C) (08/29 0740) Pulse Rate:  [58-76] 69 (08/29 0740) Resp:  [12-24] 20 (08/29 0740) BP: (145-187)/(42-73) 164/64 mmHg (08/29 0740) SpO2:  [93 %-100 %] 97 % (08/29 0740) Weight:  [126 lb 8.7 oz (57.4 kg)] 126 lb 8.7 oz (57.4 kg) (08/29 0330)  General - thin bulit, well developed, in no apparent distress.  Ophthalmologic - Not cooperative on exam.  Cardiovascular - Regular rate and rhythm, with pacer.  Mental Status -  Awake alert, orientated to self, place and people, but not to time, or age.  Language including expression, repetition was assessed and found intact, follows simple commands but not complex commands, naming 2/4, with perseveration. Fund of Knowledge was assessed and was impaired, knowing current president but not previous.  Cranial Nerves II - XII - II - blinking to visual  threat bilaterally. III, IV, VI - Extraocular movements intact. V - Facial sensation intact bilaterally. VII - Facial movement intact bilaterally. VIII - Hard of hearing & vestibular intact bilaterally. X - Palate elevates symmetrically. XI - Chin turning & shoulder shrug intact bilaterally. XII - Tongue protrusion intact.  Motor Strength - The patient's strength was equal in all extremities and pronator drift was absent.  Bulk was normal and fasciculations were absent.   Motor Tone - Muscle tone was assessed at the neck and appendages and was normal.  Reflexes - The patient's reflexes were symmetrical in all extremities and she had no pathological reflexes.  Sensory - Light touch, temperature/pinprick were assessed and were symmetrical.    Coordination - The patient had normal movements in the hands with no ataxia or dysmetria.  Tremor was absent.  Gait and Station - deferred due to safety concerns.   ASSESSMENT/PLAN [name] is a 81 y.o. female with history of HTN, CAD, heart block s/p pacer, hx of GIB admitted for new GIB. She was admitted 02/2014 for duodenal ulcer. She still on ASA at home prior to this admission as per RN. She was found to have large gastric ulcer this time. However, developed "aphasia" and "left facial droop" but quickly resolved. CT negative.    Encephalopathy vs. TIA - symptoms not completely consistent with ischemic event.   MRI  Not performed due to pacer  CT negative for acute abnormalities  CTA head and neck pending  2D Echo  pending  LDL 48  HgbA1c 5.7  SCDs for VTE prophylaxis  Diet NPO time specified   aspirin 81 mg orally every day prior to admission, now on no antithrombotic due to GIB  Ongoing aggressive stroke risk factor management  GIB  Was admitted 02/2014 for duodenal ulcer  This admission for large gastric ulcer  ASA discontinued so far  Agree to hold off antiplatelet for now  Pt is not candidate for  anticoagulation so no embolic work up needed  CBC monitoring  protonix bid  Hypertension  Home meds:   Amlodipine, lisinopril and losartan Currently on amlodipine  Stable  Patient counseled to be compliant with her blood pressure medications  Other Stroke Risk Factors  Advanced age  Coronary artery disease  Other Active Problems  anemia  Other Pertinent History  lethargy  Hospital day # 3   Rosalin Hawking, MD PhD Stroke Neurology 12/23/2014 9:29 AM    To contact Stroke Continuity provider, please refer to [email or web address] After hours, contact General Neurology

## 2014-12-23 NOTE — Progress Notes (Signed)
Initial Nutrition Assessment  DOCUMENTATION CODES:   Not applicable  INTERVENTION:   -RD will follow for diet advancement and supplement diet as appropriate  NUTRITION DIAGNOSIS:   Inadequate oral intake related to inability to eat as evidenced by NPO status.  GOAL:   Patient will meet greater than or equal to 90% of their needs  MONITOR:   PO intake, Diet advancement, Labs, Weight trends, Skin, I & O's  REASON FOR ASSESSMENT:   Consult Assessment of nutrition requirement/status  ASSESSMENT:   [name] is a 81 y.o. female with a past medical history significant for hypertension, dyslipidemia, chronic back pain and history of skin cancer; who presented to the ED secondary to syncope episode. Patient reports that she was feeling okay until this morning when she was walking toward the kitchen to feed her dog and black out. Patient was initially unable to get back on her feet for a while and then ended crawling towards her telephone and call her daughter. When the daughter arrived she noticed son blood in the patient's underwear and the patient reports some abdominal pain. There was no signs of postictal, tongue bite, stool or urine incontinence appreciated on the floor. Patient denies chest pain, fever, chills, nausea, vomiting, hematemesis, dysuria or any other acute   Pt admitted for syncope and GIB.   Pt s/p EGD on 12/20/14 which revealed gastric ulcer with high risk for rebleeding.   Code stroke was called on 11/26/14 and pt was transferred to SDU. Per RN, pt with facial droop, but stroke has been ruled out per neurology work-up. RN also reported pt has been swallowing water well.   Pt is currently NPO. She reports she was previously on a liquid diet this hospitalization and has been tolerating well (PO: 65-100%). PTA appetite was very good. Pt reports she eat "whatever I want at home". Typical intake is 3 meal per day: Breakfast: grits; Lunch and dinner: meat, starch,  and vegetable. Pt reports most frequent beverages consumed are Dr. Malachi Bonds and orange juice. She does not consume supplements at home. She reports she is very independent and cooks her own meals at home. Prior to SDU, she reports working with PT without difficulty. Per MD notes, awaiting another PT evaluation.   Pt reports no weight loss, which is confirmed by wt hx.   Nutrition-Focused physical exam completed. Findings are mild to mdoerate fat depletion, mild to moderate muscle depletion, and no edema. She denies any difference in the way her clothing fits. She reveals that her skin is "saggy, because that's what happens when you get old". Given nutrition hx, suspect that fat and muscle depletion is due to advanced age.   Discussed importance of continued good PO intake when diet is advanced to support healing.   Labs reviewed.   Diet Order:  Diet NPO time specified  Skin:  Reviewed, no issues  Last BM:  12/22/14  Height:   Ht Readings from Last 1 Encounters:  12/20/14 5' 4.5" (1.638 m)    Weight:   Wt Readings from Last 1 Encounters:  12/23/14 126 lb 8.7 oz (57.4 kg)    Ideal Body Weight:  55.9 kg  BMI:  Body mass index is 21.39 kg/(m^2).  Estimated Nutritional Needs:   Kcal:  1400-1600  Protein:  60-70 grams  Fluid:  >1.4 L  EDUCATION NEEDS:   Education needs addressed  Chayanne Filippi A. Jimmye Norman, RD, LDN, CDE Pager: [phone] After hours Pager: [phone]

## 2014-12-23 NOTE — Progress Notes (Signed)
Subjective: Mental status changes last night, work-up in progress. No abdominal pain. No blood in stool. Is hungry.  Objective: Vital signs in last 24 hours: Temp:  [97.4 F (36.3 C)-98.5 F (36.9 C)] 98.3 F (36.8 C) (08/29 1214) Pulse Rate:  [58-72] 72 (08/29 1214) Resp:  [12-27] 22 (08/29 1214) BP: (145-195)/(42-73) 195/68 mmHg (08/29 1214) SpO2:  [93 %-100 %] 100 % (08/29 1214) Weight:  [57.4 kg (126 lb 8.7 oz)] 57.4 kg (126 lb 8.7 oz) (08/29 0330) Weight change: -0.6 kg (-1 lb 5.2 oz) Last BM Date: 12/22/14  PE: GEN:  Elderly but NAD ABD:  Soft, non-tender, good bowel sounds  Lab Results: CBC    Component Value Date/Time   WBC 7.5 12/23/2014 0240   RBC 3.18* 12/23/2014 0240   HGB 9.3* 12/23/2014 0240   HCT 27.0* 12/23/2014 0240   PLT 168 12/23/2014 0240   MCV 84.9 12/23/2014 0240   MCH 29.2 12/23/2014 0240   MCHC 34.4 12/23/2014 0240   RDW 14.9 12/23/2014 0240   LYMPHSABS 1.2 12/22/2014 1808   MONOABS 0.7 12/22/2014 1808   EOSABS 0.2 12/22/2014 1808   BASOSABS 0.0 12/22/2014 1808   CMP     Component Value Date/Time   NA 140 12/23/2014 0240   K 3.9 12/23/2014 0240   CL 110 12/23/2014 0240   CO2 26 12/23/2014 0240   GLUCOSE 116* 12/23/2014 0240   BUN 25* 12/23/2014 0240   CREATININE 0.99 12/23/2014 0240   CALCIUM 9.3 12/23/2014 0240   PROT 4.6* 12/21/2014 0533   ALBUMIN 2.4* 12/21/2014 0533   AST 17 12/21/2014 0533   ALT 12* 12/21/2014 0533   ALKPHOS 25* 12/21/2014 0533   BILITOT 0.8 12/21/2014 0533   GFRNONAA 49* 12/23/2014 0240   GFRAA 57* 12/23/2014 0240   Studies/Results: Ct Head Wo Contrast  12/22/2014   CLINICAL DATA:  Left facial droop with slurred speech. Code stroke. Initial encounter.  EXAM: CT HEAD WITHOUT CONTRAST  TECHNIQUE: Contiguous axial images were obtained from the base of the skull through the vertex without intravenous contrast.  COMPARISON:  None.  FINDINGS: There is no evidence of acute intracranial hemorrhage, mass lesion,  brain edema or extra-axial fluid collection. The ventricles and subarachnoid spaces are diffusely prominent, consistent with moderate atrophy. There is patchy and confluent low-density in the periventricular and subcortical white matter consistent with chronic small vessel ischemic change. There is no evidence of acute cortical based infarct.  Intracranial vascular calcifications are noted. There is partial opacification of the ethmoid air cells on the left. The visualized paranasal sinuses, mastoid air cells and middle ears are otherwise clear. The calvarium is intact.  IMPRESSION: 1. No evidence of acute intracranial hemorrhage or acute stroke. 2. Atrophy and chronic small vessel ischemic changes. 3. These results were called by telephone at the time of interpretation on 12/22/2014 at 3:36 pm to Dr. Leonel Ramsay, who verbally acknowledged these results.   Electronically Signed   By: Richardean Sale M.D.   On: 12/22/2014 15:38   Assessment:  1.  Acute blood loss anemia. 2.  Gastric ulcer, seemingly NSAID-mediated. 3.  Mental status changes, resolved, unclear etiology.  Plan:  1.  Can transition to soft mechanical diet, as soon as work-up and testing for her mental status changes (CT angiogram head has been ordered) will allow. 2.  As soon as diet allows, would convert pantoprazole from IV to PO formulation (40 mg po bid).  Would continue pantoprazole 40 mg po bid, now and upon discharge, until  further notice. 3.  Will need repeat endoscopy in 2-3 months with Dr. Oletta Lamas, which our office can arrange. 4.  [facility] will sign-off; will need outpatient follow-up with either Dr. [name] or Dr. Oletta Lamas (both [facility], both phone [phone]) upon discharge from hospital; thank you for the consult.   Landry Dyke 12/23/2014, 1:09 PM   Pager [phone] If no answer or after 5 PM call [phone]

## 2014-12-23 NOTE — Progress Notes (Signed)
[facility] TEAM 1 - Stepdown/ICU TEAM PROGRESS NOTE  JYSSICA RIEF ZOX:[id number] DOB: 1924/06/18 DOA: 12/20/2014 PCP: [name], MD  Admit HPI / Brief Narrative: 81 y.o. female with HTN, HLD, and a hx of skin cancer who presented to [facility] ED August 26th, 2016 for evaluation of a syncopal episode that occurred the morning prior to the admission. She explained, she was unable to get up and was only able to crawl towards the telephone to call her daughter. Daughter noted some blood in pt's underwear. Daughter reported pt had been taking Ibuprofen for pain over the last 2 weeks.  In ED pt was noted to be somnolent with initial BP 61/33, HR 130's, RR 36. Blood work notable for WBC 11 K, Hg 9.5, Cr 1.36.   HPI/Subjective: The pt is resting comfortably in bed.  Her SBP is 190.  She states she feels "a bit light headed."  She denies cp, sob, n/v, or abdom pain.  She denies speech difficulty, difficulty swallowing, or cough.  Her RN reports to me she has been taking her medications by mouth w/o difficulty.    Assessment/Plan:  New onset expressive aphasia and left facial droop 8/28 - Code Stroke called 8/28 - CT head w/o acute findings  - further w/u per Neurology recommendations > for CT angio head/neck today  - would not be able to anticoag or use antiplt meds at this time given very large ulcer - clinically pt has completely recovered - will resume diet as pt has passed MD eval for equivalent of RN swallow screen, and has been taking her meds w/o difficulty th/o   Syncope - secondary to acute GI bleed  Acute GI bleed, acute blood loss anemia - large gastric ulcer  - secondary to large prepyloric gastric ulcer noted on EGD 12/20/2014 - high risk of re bleeding - continue intensive PPI therapy and monitor Hgb - had positive antibody for H. pylori but urease test was negative indicating no active infection - pt will need to see Dr. [name] in about 6 weeks to arrange repeat EGD to  document healing of the large gastric ulcer  Acute thrombocytopenia - from acute blood loss anemia / consumption  - essentially resolved at this time   Acute kidney injury - secondary to acute bleeding - renal fxn has now normalized   Complete heart block s/p PPM 2009 - currently asymptomatic - monitored on telemetry   Accelerated hypertension - resume home meds but hold on ARB as pt just regained normal renal fxn and is for angio today  E coli UTI  - to complete a 7 day course of abx  Hyperglycemia - A1C 5.7, therefore not c/w DM   Code Status: FULL Family Communication: no family present at time of exam Disposition Plan: SDU while CVA w/u pending   Consultants: [facility]   Procedures: EGD done 12/20/2014 #1 giant prepyloric gastric ulcer with high risk of re-bleeding  #2 small clean-based antral and duodenal ulcersand erosions  #3 moderately large hiatal hernia  Antibiotics: Rocephin 8/27 >  DVT prophylaxis: SCDs  Objective: Blood pressure 164/64, pulse 63, temperature 98.5 F (36.9 C), temperature source Oral, resp. rate 27, height 5' 4.5" (1.638 m), weight 57.4 kg (126 lb 8.7 oz), SpO2 100 %.  Intake/Output Summary (Last 24 hours) at 12/23/14 1152 Last data filed at 12/23/14 1049  Gross per 24 hour  Intake    980 ml  Output   2300 ml  Net  -1320 ml  Exam: General: No acute respiratory distress - alert and conversant.  Lungs: Clear to auscultation bilaterally without wheezes or crackles Cardiovascular: Regular rate and rhythm without murmur gallop or rub normal S1 and S2 Abdomen: Nontender, nondistended, soft, bowel sounds positive, no rebound, no ascites, no appreciable mass Extremities: No significant cyanosis, clubbing, or edema bilateral lower extremities Neuro:  Alert and conversant, voice strong and clear, swallows saliva w/o cough, taking oral meds w/o cough per RN, no appreciable facial droop, no aphasia, moves all 4 ext w/o difficulty or  clear deficit  Data Reviewed: Basic Metabolic Panel:  Recent Labs Lab 12/20/14 1036 12/21/14 0533 12/22/14 0655 12/23/14 0240  NA 138 138 138 140  K 4.8 4.2 4.0 3.9  CL 108 107 106 110  CO2 24 24 24 26   GLUCOSE 112* 104* 105* 116*  BUN 73* 68* 41* 25*  CREATININE 1.59* 1.36* 1.11* 0.99  CALCIUM 9.8 9.3 9.4 9.3    CBC:  Recent Labs Lab 12/20/14 1036  12/21/14 0950 12/21/14 1819 12/22/14 0107 12/22/14 0655 12/22/14 1132 12/22/14 1808 12/23/14 0240  WBC 13.3*  < > 11.0* 10.9* 10.5  --   --  9.1 7.5  NEUTROABS 10.3*  --   --   --   --   --   --  7.0  --   HGB 10.8*  < > 9.6* 9.8* 9.3* 9.7* 9.4* 9.9* 9.3*  HCT 31.1*  < > 27.3* 28.5* 26.5* 28.0* 26.7* 28.3* 27.0*  MCV 86.6  < > 83.7 85.8 84.9  --   --  85.5 84.9  PLT 179  < > 167 156 151  --   --  162 168  < > = values in this interval not displayed.  Liver Function Tests:  Recent Labs Lab 12/20/14 1036 12/21/14 0533  AST 18 17  ALT 14 12*  ALKPHOS 30* 25*  BILITOT 0.5 0.8  PROT 5.2* 4.6*  ALBUMIN 2.8* 2.4*   CBG:  Recent Labs Lab 12/20/14 1010  GLUCAP 106*    Recent Results (from the past 240 hour(s))  MRSA PCR Screening     Status: None   Collection Time: 12/20/14  3:22 PM  Result Value Ref Range Status   MRSA by PCR NEGATIVE NEGATIVE Final    Comment:        The GeneXpert MRSA Assay (FDA approved for NASAL specimens only), is one component of a comprehensive MRSA colonization surveillance program. It is not intended to diagnose MRSA infection nor to guide or monitor treatment for MRSA infections.   Urine culture     Status: None   Collection Time: 12/21/14  9:48 PM  Result Value Ref Range Status   Specimen Description URINE, CLEAN CATCH  Final   Special Requests NONE  Final   Culture >=100,000 COLONIES/mL ESCHERICHIA COLI  Final   Report Status 12/23/2014 FINAL  Final   Organism ID, Bacteria ESCHERICHIA COLI  Final      Susceptibility   Escherichia coli - MIC*    AMPICILLIN >=32  RESISTANT Resistant     CEFAZOLIN <=4 SENSITIVE Sensitive     CEFTRIAXONE <=1 SENSITIVE Sensitive     CIPROFLOXACIN 1 SENSITIVE Sensitive     GENTAMICIN <=1 SENSITIVE Sensitive     IMIPENEM <=0.25 SENSITIVE Sensitive     NITROFURANTOIN <=16 SENSITIVE Sensitive     TRIMETH/SULFA >=320 RESISTANT Resistant     AMPICILLIN/SULBACTAM 16 INTERMEDIATE Intermediate     PIP/TAZO <=4 SENSITIVE Sensitive     * >=100,000 COLONIES/mL  ESCHERICHIA COLI     Studies:   Recent x-ray studies have been reviewed in detail by the Attending Physician  Scheduled Meds:  Scheduled Meds: .  stroke: mapping our early stages of recovery book   Does not apply Once  . cefTRIAXone (ROCEPHIN)  IV  1 g Intravenous Q24H  . cyanocobalamin  500 mcg Oral Daily  . hydrALAZINE  10 mg Oral 3 times per day  . pantoprazole (PROTONIX) IV  40 mg Intravenous Q12H  . pneumococcal 23 valent vaccine  0.5 mL Intramuscular Tomorrow-1000    Time spent on care of this patient: 35 mins   Sheriff Rodenberg T , MD   [facility] Office  [phone] Pager - Text Page per Shea Evans as per below:  On-Call/Text Page:      [email or web address]      password [email or web address]  If 7PM-7AM, please contact night-coverage [email or web address] Password [email or web address] 12/23/2014, 11:52 AM   LOS: 3 days

## 2014-12-24 ENCOUNTER — Encounter (HOSPITAL_COMMUNITY): Payer: Self-pay | Admitting: Gastroenterology

## 2014-12-24 DIAGNOSIS — D62 Acute posthemorrhagic anemia: Secondary | ICD-10-CM | POA: Diagnosis present

## 2014-12-24 DIAGNOSIS — N390 Urinary tract infection, site not specified: Secondary | ICD-10-CM

## 2014-12-24 DIAGNOSIS — K922 Gastrointestinal hemorrhage, unspecified: Secondary | ICD-10-CM | POA: Diagnosis present

## 2014-12-24 DIAGNOSIS — R4701 Aphasia: Secondary | ICD-10-CM | POA: Diagnosis present

## 2014-12-24 DIAGNOSIS — R55 Syncope and collapse: Secondary | ICD-10-CM | POA: Diagnosis present

## 2014-12-24 DIAGNOSIS — I10 Essential (primary) hypertension: Secondary | ICD-10-CM | POA: Diagnosis present

## 2014-12-24 DIAGNOSIS — K253 Acute gastric ulcer without hemorrhage or perforation: Secondary | ICD-10-CM | POA: Diagnosis present

## 2014-12-24 DIAGNOSIS — B9620 Unspecified Escherichia coli [E. coli] as the cause of diseases classified elsewhere: Secondary | ICD-10-CM | POA: Diagnosis present

## 2014-12-24 DIAGNOSIS — D696 Thrombocytopenia, unspecified: Secondary | ICD-10-CM

## 2014-12-24 DIAGNOSIS — I639 Cerebral infarction, unspecified: Secondary | ICD-10-CM | POA: Diagnosis present

## 2014-12-24 LAB — COMPREHENSIVE METABOLIC PANEL
ALT: 11 U/L — AB (ref 14–54)
AST: 19 U/L (ref 15–41)
Albumin: 2.9 g/dL — ABNORMAL LOW (ref 3.5–5.0)
Alkaline Phosphatase: 34 U/L — ABNORMAL LOW (ref 38–126)
Anion gap: 10 (ref 5–15)
BILIRUBIN TOTAL: 0.5 mg/dL (ref 0.3–1.2)
BUN: 42 mg/dL — AB (ref 6–20)
CHLORIDE: 105 mmol/L (ref 101–111)
CO2: 20 mmol/L — ABNORMAL LOW (ref 22–32)
CREATININE: 1.9 mg/dL — AB (ref 0.44–1.00)
Calcium: 9.2 mg/dL (ref 8.9–10.3)
GFR, EST AFRICAN AMERICAN: 26 mL/min — AB (ref >60)
GFR, EST NON AFRICAN AMERICAN: 22 mL/min — AB (ref >60)
Glucose, Bld: 133 mg/dL — ABNORMAL HIGH (ref 65–99)
Potassium: 4 mmol/L (ref 3.5–5.1)
Sodium: 135 mmol/L (ref 135–145)
TOTAL PROTEIN: 5.5 g/dL — AB (ref 6.5–8.1)

## 2014-12-24 LAB — CBC
HCT: 32.2 % — ABNORMAL LOW (ref 36.0–46.0)
Hemoglobin: 10.8 g/dL — ABNORMAL LOW (ref 12.0–15.0)
MCH: 29.2 pg (ref 26.0–34.0)
MCHC: 33.5 g/dL (ref 30.0–36.0)
MCV: 87 fL (ref 78.0–100.0)
PLATELETS: 219 10*3/uL (ref 150–400)
RBC: 3.7 MIL/uL — AB (ref 3.87–5.11)
RDW: 15.6 % — AB (ref 11.5–15.5)
WBC: 14 10*3/uL — AB (ref 4.0–10.5)

## 2014-12-24 MED ORDER — AMLODIPINE BESYLATE 5 MG PO TABS
5.0000 mg | ORAL_TABLET | Freq: Every day | ORAL | Status: DC
  Administered 2014-12-25 – 2014-12-28 (×4): 5 mg via ORAL
  Filled 2014-12-24 (×4): qty 1

## 2014-12-24 MED ORDER — IOHEXOL 350 MG/ML SOLN
60.0000 mL | Freq: Once | INTRAVENOUS | Status: AC | PRN
  Administered 2014-12-23: 60 mL via INTRAVENOUS

## 2014-12-24 NOTE — Progress Notes (Signed)
[facility] TEAM 1 - Stepdown/ICU TEAM Progress Note  [name] ACZ:[id number] DOB: 12/31/1924 DOA: 12/20/2014 PCP: [name], MD  Admit HPI / Brief Narrative: 81y.o. WF PMHx Anxiety, Depression, HTN, HLD, extension tube dysfunction, S/P pacemaker? and a hx of skin cancer   Presented to [facility] ED August 26th, 2016 for evaluation of a syncopal episode that occurred the morning prior to the admission. She explained, she was unable to get up and was only able to crawl towards the telephone to call her daughter. Daughter noted some blood in pt's underwear. Daughter reported pt had been taking Ibuprofen for pain over the last 2 weeks.  In ED pt was noted to be somnolent with initial BP 61/33, HR 130's, RR 36. Blood work notable for WBC 11 K, Hg 9.5, Cr 1.36.     HPI/Subjective: 8/30 A/O 4, though somewhat confused when answering questions. Patient states that she noticed during her meals was unable to see some of the food when placed in certain areas on the tray. Patient also states was driving her own vehicle until hospitalized.   Assessment/Plan:  New onset expressive aphasia and left facial droop 8/28; acute stroke? 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  Syncope - secondary to acute GI bleed  Acute GI bleed, acute blood loss anemia - large gastric ulcer  - secondary to large prepyloric gastric ulcer noted on EGD 12/20/2014 - high risk of re bleeding - continue intensive PPI therapy and monitor Hgb - had positive antibody  for H. pylori but urease test was negative indicating no active infection - pt will need to see Dr. [name] in about 6 weeks to arrange repeat EGD to document healing of the large gastric ulcer  Acute thrombocytopenia - Resolved   Acute kidney injury - Secondary to acute blood loss anemia; Improving  Complete heart block s/p PPM 2009 - currently asymptomatic - monitored on telemetry   Accelerated hypertension -Hold ARB  secondary to acute kidney injury -Decrease amlodipine to 5 mg daily; as 81 year old BP situationally hypotensive. Would allow SBP to run~140-150   E coli UTI  - to complete a 7 day course of abx  Hyperglycemia - A1C 5.7, therefore not c/w DM   Code Status: FULL Family Communication: no family present at time of exam Disposition Plan: CIR vs SNF    Consultants: Dr.Jindong Xu (stroke team) Dr.James Oletta Lamas [facility])   Procedure/Significant Events: EGD done 12/20/2014 #1 giant prepyloric gastric ulcer with high risk of re-bleeding #2 small clean-based antral and duodenal ulcersand erosions #3 moderately large hiatal hernia 8/29 CT angiogram neck and head;No CT evidence of large acute infarct although evaluation for detection of small or mediumsize infarct is limited by the white matter changes. -60% diameter stenosis proximal left internal carotid artery.   Culture 8/26 MRSA by PCR negative 8/27 urine positive Escherichia coli   Antibiotics: Rocephin 8/27 >  DVT prophylaxis: SCD   Devices    LINES / TUBES:      Continuous Infusions:   Objective: VITAL SIGNS: Temp: 98.2 F (  36.8 C) (08/30 1932) Temp Source: Oral (08/30 1932) BP: 133/35 mmHg (08/30 2000) Pulse Rate: 57 (08/30 2000) SPO2; FIO2:   Intake/Output Summary (Last 24 hours) at 12/24/14 2104 Last data filed at 12/24/14 1752  Gross per 24 hour  Intake    770 ml  Output   1000 ml  Net   -230 ml     Exam: General:A/O 4, though somewhat confused when answering  questions, No acute respiratory distress Eyes: Negative headache, eye pain, double vision,negative scleral hemorrhage ENT: Negative Runny nose, negative ear pain, negative tinnitus, negative gingival bleeding Neck:  Negative scars, masses, torticollis, lymphadenopathy, JVD Lungs: Clear to auscultation bilaterally without wheezes or crackles Cardiovascular: Regular rate and rhythm without murmur gallop or rub normal S1 and S2 Abdomen:negative abdominal pain, negative dysphagia, nondistended, positive soft, bowel sounds, no rebound, no ascites, no appreciable mass Extremities: No significant cyanosis, clubbing, or edema bilateral lower extremities Psychiatric:  Negative depression, negative anxiety, negative fatigue, negative mania  Neurologic:  Cranial nerves II through XII intact, tongue/uvula midline, all extremities muscle strength 5/5, sensation intact throughout, negative dysarthria, negative expressive aphasia, negative receptive aphasia. Bitemporal hemianopsia   Data Reviewed: Basic Metabolic Panel:  Recent Labs Lab 12/20/14 1036 12/21/14 0533 12/22/14 0655 12/23/14 0240 12/24/14 0303  NA 138 138 138 140 135  K 4.8 4.2 4.0 3.9 4.0  CL 108 107 106 110 105  CO2 24 24 24 26  20*  GLUCOSE 112* 104* 105* 116* 133*  BUN 73* 68* 41* 25* 42*  CREATININE 1.59* 1.36* 1.11* 0.99 1.90*  CALCIUM 9.8 9.3 9.4 9.3 9.2   Liver Function Tests:  Recent Labs Lab 12/20/14 1036 12/21/14 0533 12/24/14 0303  AST 18 17 19   ALT 14 12* 11*  ALKPHOS 30* 25* 34*  BILITOT 0.5 0.8 0.5  PROT 5.2* 4.6* 5.5*  ALBUMIN 2.8* 2.4* 2.9*   No results for input(s): LIPASE, AMYLASE in the last 168 hours. No results for input(s): AMMONIA in the last 168 hours. CBC:  Recent Labs Lab 12/20/14 1036  12/21/14 1819 12/22/14 0107 12/22/14 0655 12/22/14 1132 12/22/14 1808 12/23/14 0240 12/24/14 0303  WBC 13.3*  < > 10.9* 10.5  --   --  9.1 7.5 14.0*  NEUTROABS 10.3*  --   --   --   --   --  7.0  --    --   HGB 10.8*  < > 9.8* 9.3* 9.7* 9.4* 9.9* 9.3* 10.8*  HCT 31.1*  < > 28.5* 26.5* 28.0* 26.7* 28.3* 27.0* 32.2*  MCV 86.6  < > 85.8 84.9  --   --  85.5 84.9 87.0  PLT 179  < > 156 151  --   --  162 168 219  < > = values in this interval not displayed. Cardiac Enzymes: No results for input(s): CKTOTAL, CKMB, CKMBINDEX, TROPONINI in the last 168 hours. BNP (last 3 results) No results for input(s): BNP in the last 8760 hours.  ProBNP (last 3 results) No results for input(s): PROBNP in the last 8760 hours.  CBG:  Recent Labs Lab 12/20/14 1010  GLUCAP 106*    Recent Results (from the past 240 hour(s))  MRSA PCR Screening     Status: None   Collection Time: 12/20/14  3:22 PM  Result Value Ref Range Status   MRSA by PCR NEGATIVE NEGATIVE Final    Comment:        The GeneXpert MRSA Assay (FDA approved for NASAL specimens only), is one component of a  comprehensive MRSA colonization surveillance program. It is not intended to diagnose MRSA infection nor to guide or monitor treatment for MRSA infections.   Urine culture     Status: None   Collection Time: 12/21/14  9:48 PM  Result Value Ref Range Status   Specimen Description URINE, CLEAN CATCH  Final   Special Requests NONE  Final   Culture >=100,000 COLONIES/mL ESCHERICHIA COLI  Final   Report Status 12/23/2014 FINAL  Final   Organism ID, Bacteria ESCHERICHIA COLI  Final      Susceptibility   Escherichia coli - MIC*    AMPICILLIN >=32 RESISTANT Resistant     CEFAZOLIN <=4 SENSITIVE Sensitive     CEFTRIAXONE <=1 SENSITIVE Sensitive     CIPROFLOXACIN 1 SENSITIVE Sensitive     GENTAMICIN <=1 SENSITIVE Sensitive     IMIPENEM <=0.25 SENSITIVE Sensitive     NITROFURANTOIN <=16 SENSITIVE Sensitive     TRIMETH/SULFA >=320 RESISTANT Resistant     AMPICILLIN/SULBACTAM 16 INTERMEDIATE Intermediate     PIP/TAZO <=4 SENSITIVE Sensitive     * >=100,000 COLONIES/mL ESCHERICHIA COLI     Studies:  Recent x-ray studies have been  reviewed in detail by the Attending Physician  Scheduled Meds:  Scheduled Meds: .  stroke: mapping our early stages of recovery book   Does not apply Once  . [START ON 12/25/2014] amLODipine  5 mg Oral Daily  . cefTRIAXone (ROCEPHIN)  IV  1 g Intravenous Q24H  . cyanocobalamin  500 mcg Oral Daily  . hydrALAZINE  25 mg Oral 3 times per day  . pantoprazole  40 mg Oral BID  . pneumococcal 23 valent vaccine  0.5 mL Intramuscular Tomorrow-1000    Time spent on care of this patient: 40 mins   Ariea Rochin, Geraldo Docker , MD  [facility] Office  [phone] Pager - [phone]  On-Call/Text Page:      [email or web address]      password [email or web address]  If 7PM-7AM, please contact night-coverage [email or web address] Password [email or web address] 12/24/2014, 9:04 PM   LOS: 4 days   Care during the described time interval was provided by me .  I have reviewed this patient's available data, including medical history, events of note, physical examination, and all test results as part of my evaluation. I have personally reviewed and interpreted all radiology studies.   Dia Crawford, MD [phone] Pager

## 2014-12-24 NOTE — Progress Notes (Signed)
STROKE TEAM PROGRESS NOTE   SUBJECTIVE (INTERVAL HISTORY) Her RN is at the bedside.  Overall she feels her condition is stable. As per RN, the "aphasia" and "L facial droop" episode lasted about "minutes". Resolved after CT head.    OBJECTIVE Temp:  [97.9 F (36.6 C)-98.5 F (36.9 C)] 98.2 F (36.8 C) (08/30 0736) Pulse Rate:  [58-120] 120 (08/30 0800) Cardiac Rhythm:  [-] Ventricular paced (08/30 0800) Resp:  [15-27] 17 (08/30 0800) BP: (85-195)/(22-77) 145/45 mmHg (08/30 0800) SpO2:  [93 %-100 %] 99 % (08/30 0800) Weight:  [128 lb 1.4 oz (58.1 kg)] 128 lb 1.4 oz (58.1 kg) (08/30 0500)   Recent Labs Lab 12/20/14 1010  GLUCAP 106*    Recent Labs Lab 12/20/14 1036 12/21/14 0533 12/22/14 0655 12/23/14 0240 12/24/14 0303  NA 138 138 138 140 135  K 4.8 4.2 4.0 3.9 4.0  CL 108 107 106 110 105  CO2 24 24 24 26  20*  GLUCOSE 112* 104* 105* 116* 133*  BUN 73* 68* 41* 25* 42*  CREATININE 1.59* 1.36* 1.11* 0.99 1.90*  CALCIUM 9.8 9.3 9.4 9.3 9.2    Recent Labs Lab 12/20/14 1036 12/21/14 0533 12/24/14 0303  AST 18 17 19   ALT 14 12* 11*  ALKPHOS 30* 25* 34*  BILITOT 0.5 0.8 0.5  PROT 5.2* 4.6* 5.5*  ALBUMIN 2.8* 2.4* 2.9*    Recent Labs Lab 12/20/14 1036  12/21/14 1819 12/22/14 0107 12/22/14 0655 12/22/14 1132 12/22/14 1808 12/23/14 0240 12/24/14 0303  WBC 13.3*  < > 10.9* 10.5  --   --  9.1 7.5 14.0*  NEUTROABS 10.3*  --   --   --   --   --  7.0  --   --   HGB 10.8*  < > 9.8* 9.3* 9.7* 9.4* 9.9* 9.3* 10.8*  HCT 31.1*  < > 28.5* 26.5* 28.0* 26.7* 28.3* 27.0* 32.2*  MCV 86.6  < > 85.8 84.9  --   --  85.5 84.9 87.0  PLT 179  < > 156 151  --   --  162 168 219  < > = values in this interval not displayed. No results for input(s): CKTOTAL, CKMB, CKMBINDEX, TROPONINI in the last 168 hours. No results for input(s): LABPROT, INR in the last 72 hours.  Recent Labs  12/21/14 1217  COLORURINE YELLOW  LABSPEC 1.016  PHURINE 5.0  GLUCOSEU NEGATIVE  HGBUR  MODERATE*  BILIRUBINUR NEGATIVE  KETONESUR NEGATIVE  PROTEINUR NEGATIVE  UROBILINOGEN 0.2  NITRITE POSITIVE*  LEUKOCYTESUR SMALL*       Component Value Date/Time   CHOL 104 12/21/2014 0232   TRIG 126 12/21/2014 0232   HDL 31* 12/21/2014 0232   CHOLHDL 3.4 12/21/2014 0232   VLDL 25 12/21/2014 0232   LDLCALC 48 12/21/2014 0232   Lab Results  Component Value Date   HGBA1C 5.7* 12/20/2014   No results found for: LABOPIA, COCAINSCRNUR, LABBENZ, AMPHETMU, THCU, LABBARB  No results for input(s): ETH in the last 168 hours.  I have personally reviewed the radiological images below and agree with the radiology interpretations. Red text is my reading.  Dg Chest 2 View  12/20/2014   IMPRESSION: Patchy left basilar opacity -favor atelectasis over airspace disease. Consider radiographic follow-up in 2-3 weeks.  T6 and L1 compression fractures of uncertain chronicity but new since 2008. Correlate clinically.   Ct Head Wo Contrast  12/22/2014  IMPRESSION: 1. No evidence of acute intracranial hemorrhage or acute stroke. 2. Atrophy and chronic small  vessel ischemic changes.  CTA head and neck  - Prominent small vessel disease type changes. No CT evidence of large acute infarct although evaluation for detection of small or medium size infarct is limited by the white matter changes. Global atrophy without hydrocephalus. 60% diameter stenosis proximal left internal carotid artery. Minimal plaque proximal right internal carotid artery. Codominant vertebral arteries. Slight narrowing distal right vertebral artery. Anterior and posterior intracranial circulation without medium or large size vessel significant stenosis or occlusion. Mild intracranial branch vessel irregularity. By my reading, left ICA no significant stenosis per NASCET criteria.   2D Echocardiogram   - Left ventricle: The cavity size was normal. Wall thickness was normal. Systolic function was vigorous. The estimated  ejection fraction was in the range of 65% to 70%. There is hypokinesis of the inferior myocardium. Doppler parameters are consistent with abnormal left ventricular relaxation (grade 1 diastolic dysfunction). - Right ventricle: Akinesis of the RV mid anterior free wall. Systolic pressure was increased. - Pulmonary arteries: Systolic pressure was mildly increased. PA peak pressure: 35 mm Hg (S). Impressions: - No cardiac source of emboli was indentified.   PHYSICAL EXAM  Temp:  [97.9 F (36.6 C)-98.5 F (36.9 C)] 98.2 F (36.8 C) (08/30 0736) Pulse Rate:  [58-120] 120 (08/30 0800) Resp:  [15-27] 17 (08/30 0800) BP: (85-195)/(22-77) 145/45 mmHg (08/30 0800) SpO2:  [93 %-100 %] 99 % (08/30 0800) Weight:  [128 lb 1.4 oz (58.1 kg)] 128 lb 1.4 oz (58.1 kg) (08/30 0500)  General - thin bulit, well developed, in no apparent distress.  Ophthalmologic - Not cooperative on exam.  Cardiovascular - Regular rate and rhythm, with pacer.  Mental Status -  Awake alert, orientated to self, place and people, but not to time, or age.  Language including expression, repetition was assessed and found intact, follows simple commands but not complex commands, naming 2/4. Fund of Knowledge was assessed and was impaired, knowing current president but not previous.  Cranial Nerves II - XII - II - blinking to visual threat bilaterally. III, IV, VI - Extraocular movements intact. V - Facial sensation intact bilaterally. VII - Facial movement intact bilaterally. VIII - Hard of hearing & vestibular intact bilaterally. X - Palate elevates symmetrically. XI - Chin turning & shoulder shrug intact bilaterally. XII - Tongue protrusion intact.  Motor Strength - The patient's strength was equal in all extremities and pronator drift was absent.  Bulk was normal and fasciculations were absent.   Motor Tone - Muscle tone was assessed at the neck and appendages and was normal.  Reflexes - The  patient's reflexes were symmetrical in all extremities and she had no pathological reflexes.  Sensory - Light touch, temperature/pinprick were assessed and were symmetrical.    Coordination - The patient had normal movements in the hands with no ataxia or dysmetria.  Tremor was absent.  Gait and Station - deferred due to safety concerns.   ASSESSMENT/PLAN [name] is a 81 y.o. female with history of HTN, CAD, heart block s/p pacer, hx of GIB admitted for new GIB. She was admitted 02/2014 for duodenal ulcer. She still on ASA at home prior to this admission as per RN. She was found to have large gastric ulcer this time. However, developed "aphasia" and "left facial droop" but quickly resolved. CT negative.    Encephalopathy vs. TIA - symptoms not completely consistent with ischemic event.   MRI  Not performed due to pacer  CT negative for acute abnormalities  CTA head and neck negative for large vessel stenosis or occlusion  2D Echo  EF 65-70%  LDL 48  HgbA1c 5.7  SCDs for VTE prophylaxis  DIET SOFT Room service appropriate?: Yes; Fluid consistency:: Thin   aspirin 81 mg orally every day prior to admission, now on no antithrombotic due to GIB. Resume ASA 81mg  whenever GI feels appropriate.  Ongoing aggressive stroke risk factor management  GIB  Was admitted 02/2014 for duodenal ulcer  This admission for large gastric ulcer  ASA discontinued so far  Agree to hold off antiplatelet for now. Resume ASA 81mg  whenever GI feels appropriate.   Pt is not candidate for anticoagulation so no embolic work up needed  CBC monitoring  protonix bid  Hypertension  Home meds:   Amlodipine, lisinopril and losartan Currently on amlodipine  Stable  Patient counseled to be compliant with her blood pressure medications  Other Stroke Risk Factors  Advanced age  Coronary artery disease  Other Active Problems  anemia  Other Pertinent  History  lethargy  Hospital day # 4  Neurology will sign off. Please call with questions. No neuro follow up needed at this time. Thanks for the consult.  Rosalin Hawking, MD PhD Stroke Neurology 12/24/2014 9:15 AM    To contact Stroke Continuity provider, please refer to [email or web address] After hours, contact General Neurology

## 2014-12-24 NOTE — Evaluation (Addendum)
Speech Language Pathology Evaluation Patient Details Name: ABEL RA MRN: [id number] DOB: 1924-06-07 Today's Date: 12/24/2014 Time: 6063-0160 SLP Time Calculation (min) (ACUTE ONLY): 26 min  Problem List:  Patient Active Problem List   Diagnosis Date Noted  . TIA (transient ischemic attack)   . Acute encephalopathy   . Syncope 12/20/2014  . GIB (gastrointestinal bleeding) 12/20/2014  . Acute kidney injury 12/20/2014  . Leukocytosis 12/20/2014  . Hyperglycemia 12/20/2014  . Dyslipidemia 12/20/2014  . Bleeding gastrointestinal   . Renal insufficiency   . Faintness   . Chronic headaches 06/28/2014  . Bilateral lower extremity edema 06/28/2014  . CAD (coronary artery disease), CAth 04/2004 non obst 03/22/2014  . Essential hypertension 11/21/2013  . Pacemaker 05/19/2013  . Complete heart block s/p PPM 2009 05/19/2013   Past Medical History:  Past Medical History  Diagnosis Date  . Hypertension   . Facial rash   . Seborrheic keratoses   . Microscopic hematuria     with negative workup   . Fibrocystic disease of breast   . Anxiety   . Compression fracture of L1 lumbar vertebra   . Eustachian tube dysfunction   . Allergic rhinitis   . Rhomboid muscle pain     spasm  . Depression     situational  . Urinary frequency     with nocturia  . Headache   . UTI (lower urinary tract infection)   . Tendinopathy of rotator cuff   . Arthritis     Left AC joint  . Complication of anesthesia     states allergy to a med used with pacemaker - made her wild   Past Surgical History:  Past Surgical History  Procedure Laterality Date  . Insert / replace / remove pacemaker    . Esophagogastroduodenoscopy Left 03/22/2014    Procedure: ESOPHAGOGASTRODUODENOSCOPY (EGD);  Surgeon: Arta Silence, MD;  Location: [facility] ENDOSCOPY;  Service: Endoscopy;  Laterality: Left;  . Esophagogastroduodenoscopy [name] 12/20/2014    Procedure: ESOPHAGOGASTRODUODENOSCOPY (EGD);  Surgeon: Teena Irani, MD;   Location: [facility] ENDOSCOPY;  Service: Endoscopy;  Laterality: [name];   HPI:  81 y.o. female with a past medical history significant for hypertension, dyslipidemia, chronic back pain and history of skin cancer; who presented to the ED secondary to syncope episode. Chest x-ray with some opacities in her lower left lung, but unclear if infiltrates or not. CT CT angio acute infarct although evaluation for detection of small or medium size infarct is limited by the white matter changes.   Assessment / Plan / Recommendation Clinical Impression  Pt exhibited moderate, borderline significant impairments in the areas of vision, working memory, awareness, problem solving and reasoning. Comprehension for multistep directions is decreased. Verbal expression decreased appears due to cognitive deficits versus aphasia. She appears unsafe to live alone and would benefit from 24 hour supervision. ST while in hospital and recommend at discharge.      SLP Assessment  Patient needs continued Speech Lanaguage Pathology Services    Follow Up Recommendations  Skilled Nursing facility    Frequency and Duration min 2x/week  2 weeks   Pertinent Vitals/Pain Pain Assessment: No/denies pain   SLP Goals  Potential to Achieve Goals (ACUTE ONLY): Fair Potential Considerations (ACUTE ONLY): Previous level of function  SLP Evaluation Prior Functioning  Cognitive/Linguistic Baseline:  (suspect deficits) Type of Home: House  Lives With: Alone Education:  (completed HS) Vocation: Retired   Associate Professor  Overall Cognitive Status: No family/caregiver present to determine baseline cognitive functioning Arousal/Alertness: Awake/alert  Orientation Level: Oriented to person;Oriented to time;Oriented to place Attention: Sustained Sustained Attention: Appears intact Memory: Impaired Memory Impairment: Storage deficit;Retrieval deficit;Decreased recall of new information;Decreased short term memory;Prospective memory Decreased  Short Term Memory: Verbal basic Awareness: Impaired Awareness Impairment: Intellectual impairment;Emergent impairment;Anticipatory impairment Problem Solving: Impaired Problem Solving Impairment: Verbal basic Safety/Judgment: Impaired    Comprehension  Auditory Comprehension Overall Auditory Comprehension: Impaired Yes/No Questions: Not tested Commands: Impaired Two Step Basic Commands: 25-49% accurate Multistep Basic Commands: 25-49% accurate Interfering Components: Working memory;Visual impairments Environmental consultant Discrimination: Not tested Reading Comprehension Reading Status: Impaired (poor vision) Functional Environmental (signs, name badge): Impaired Interfering Components: Visual scanning;Visual acuity;Visual perceptual;Working memory    Expression Expression Primary Mode of Expression: Verbal Verbal Expression Overall Verbal Expression: Impaired Initiation: No impairment Level of Generative/Spontaneous Verbalization: Conversation Repetition: Impaired Level of Impairment: Sentence level Naming: Impairment Responsive: 76-100% accurate Confrontation:  (90%) Pragmatics: No impairment Written Expression Dominant Hand: Right Written Expression: Not tested   Oral / Motor Oral Motor/Sensory Function Overall Oral Motor/Sensory Function: Appears within functional limits for tasks assessed Motor Speech Overall Motor Speech: Appears within functional limits for tasks assessed Respiration: Within functional limits Phonation: Normal Resonance: Within functional limits Articulation: Within functional limitis Intelligibility: Intelligible Motor Planning: Witnin functional limits   GO     Houston Siren 12/24/2014, 10:10 AM Orbie Pyo Colvin Caroli.Ed [employer] [phone]

## 2014-12-24 NOTE — Progress Notes (Signed)
Physical Therapy Treatment Patient Details Name: [name] MRN: [id number] DOB: 05-25-1924 Today's Date: 12/24/2014    History of Present Illness [name] is a 81 y.o. female with a past medical history significant for hypertension, dyslipidemia, chronic back pain and history of skin cancer; who presented to the ED secondary to syncopal episode and GIB. 12/22/14  pt with aphasia and mild left-sided facial droop.    PT Comments    Pt continues to be pleasant but has no awareness or recollection of visual deficits, cognitive change or events since admission. Pt was performing well and cognitively intact on my last visit 8/27 and today demonstrates decreased memory, problem solving, STM and functional mobility. Pt will require 24hr supervision and assist for mobility due to visual and cognitive change. If family cannot provide pt will require SNF. Will continue to follow.   Follow Up Recommendations  SNF;Supervision/Assistance - 24 hour     Equipment Recommendations  None recommended by PT    Recommendations for Other Services       Precautions / Restrictions Precautions Precautions: Fall Precaution Comments: right homonymous hemianopsia Restrictions Weight Bearing Restrictions: No    Mobility  Bed Mobility Overal bed mobility: Needs Assistance       Supine to sit: Supervision     General bed mobility comments: for safety and lines  Transfers Overall transfer level: Needs assistance     Sit to Stand: Supervision         General transfer comment: supervision for safety and lines  Ambulation/Gait Ambulation/Gait assistance: Min guard Ambulation Distance (Feet): 500 Feet Assistive device: Rolling walker (2 wheeled) Gait Pattern/deviations: Step-through pattern;Decreased stride length   Gait velocity interpretation: Below normal speed for age/gender General Gait Details: cues for position in RW, pt running into obstacles to the right x 5 despite  cues, assist to return to room as unable to see or recall room number   Stairs            Wheelchair Mobility    Modified Rankin (Stroke Patients Only) Modified Rankin (Stroke Patients Only) Pre-Morbid Rankin Score: No symptoms Modified Rankin: Moderate disability     Balance     Sitting balance-Leahy Scale: Good       Standing balance-Leahy Scale: Fair                      Cognition Arousal/Alertness: Awake/alert Behavior During Therapy: WFL for tasks assessed/performed Overall Cognitive Status: Impaired/Different from baseline Area of Impairment: Safety/judgement;Orientation;Memory;Problem solving Orientation Level: Time   Memory: Decreased short-term memory   Safety/Judgement: Decreased awareness of deficits;Decreased awareness of safety   Problem Solving: Requires verbal cues General Comments: pt stating year is 2006, unable to recall room number after 5x of education, unable to follow cues for scanning environment with visual deficits    Exercises      General Comments        Pertinent Vitals/Pain Pain Assessment: No/denies pain  66-73 sats 94% on RA    Home Living       Type of Home: House              Prior Function            PT Goals (current goals can now be found in the care plan section) Acute Rehab PT Goals Time For Goal Achievement: 01/07/15 Potential to Achieve Goals: Good Progress towards PT goals: Goals downgraded-see care plan    Frequency       PT  Plan Discharge plan needs to be updated    Co-evaluation             End of Session   Activity Tolerance: Patient tolerated treatment well Patient left: in bed;with call bell/phone within reach     Time: 3244-0102 PT Time Calculation (min) (ACUTE ONLY): 26 min  Charges:  $Gait Training: 8-22 mins $Therapeutic Activity: 8-22 mins                    G Codes:      Melford Aase 2015/01/07, 11:59 AM Elwyn Reach, [place]

## 2014-12-24 NOTE — Progress Notes (Signed)
Pt V paced in A flutter, rate usually 60s, more increasingly jumping up to 140s, comes right back down to 60s, pt alert oriented asymptomatic, BP 134/40 (75). Called Baltazar Najjar, orders to get EKG if pt sustains in the 140s, will continue to monitor closely.

## 2014-12-25 DIAGNOSIS — H5347 Heteronymous bilateral field defects: Secondary | ICD-10-CM

## 2014-12-25 DIAGNOSIS — R55 Syncope and collapse: Secondary | ICD-10-CM

## 2014-12-25 DIAGNOSIS — K253 Acute gastric ulcer without hemorrhage or perforation: Secondary | ICD-10-CM

## 2014-12-25 DIAGNOSIS — B9620 Unspecified Escherichia coli [E. coli] as the cause of diseases classified elsewhere: Secondary | ICD-10-CM

## 2014-12-25 DIAGNOSIS — I6350 Cerebral infarction due to unspecified occlusion or stenosis of unspecified cerebral artery: Secondary | ICD-10-CM

## 2014-12-25 DIAGNOSIS — K922 Gastrointestinal hemorrhage, unspecified: Secondary | ICD-10-CM

## 2014-12-25 DIAGNOSIS — G9340 Encephalopathy, unspecified: Secondary | ICD-10-CM

## 2014-12-25 DIAGNOSIS — D62 Acute posthemorrhagic anemia: Secondary | ICD-10-CM

## 2014-12-25 DIAGNOSIS — N390 Urinary tract infection, site not specified: Secondary | ICD-10-CM

## 2014-12-25 LAB — BASIC METABOLIC PANEL
ANION GAP: 7 (ref 5–15)
BUN: 47 mg/dL — ABNORMAL HIGH (ref 6–20)
CALCIUM: 9.1 mg/dL (ref 8.9–10.3)
CO2: 22 mmol/L (ref 22–32)
Chloride: 108 mmol/L (ref 101–111)
Creatinine, Ser: 1.64 mg/dL — ABNORMAL HIGH (ref 0.44–1.00)
GFR, EST AFRICAN AMERICAN: 31 mL/min — AB (ref >60)
GFR, EST NON AFRICAN AMERICAN: 27 mL/min — AB (ref >60)
GLUCOSE: 139 mg/dL — AB (ref 65–99)
POTASSIUM: 3.7 mmol/L (ref 3.5–5.1)
Sodium: 137 mmol/L (ref 135–145)

## 2014-12-25 LAB — MAGNESIUM: Magnesium: 1.5 mg/dL — ABNORMAL LOW (ref 1.7–2.4)

## 2014-12-25 NOTE — Care Management Note (Signed)
Case Management Note  Patient Details  Name: [name] MRN: [id number] Date of Birth: [birth year]  Subjective/Objective:        Adm w syncope,facial droop            Action/Plan:lives alone, da lives about 1 [place]/53miles away, no hx of hhc per pt   Expected Discharge Date:                  Expected Discharge Plan:  [facility]  In-House Referral:  Clinical Social Work  Discharge planning Services  CM Consult  Post Acute Care Choice:    Choice offered to:     DME Arranged:    DME Agency:     HH Arranged:    [place] Agency:     Status of Service:     Medicare Important Message Given:  Yes-second notification given Date Medicare IM Given:    Medicare IM give by:    Date Additional Medicare IM Given:    Additional Medicare Important Message give by:     If discussed at [place] of Stay Meetings, dates discussed:    Additional Comments: phy there had rec snf but da states they will take pt home w hhc and hire help if needed. Pt has long term care policy per da and this may help w private duty assistance. Left copay of private duty list and home health care agencies with my card in the room for da.  Lacretia Leigh, RN 12/25/2014, 1:12 PM

## 2014-12-25 NOTE — Clinical Social Work Note (Signed)
Clinical Social Work Assessment  Patient Details  Name: [name] MRN: [id number] Date of Birth: [birth year]  Date of referral:  12/25/14               Reason for consult:  Facility Placement                Permission sought to share information with:  Case Manager Permission granted to share information::  Yes, Verbal Permission Granted  Name::        Agency::     Relationship::     Contact Information:     Housing/Transportation Living arrangements for the past 2 months:  Single Family Home Source of Information:  Adult Children Patient Interpreter Needed:  None Criminal Activity/Legal Involvement Pertinent to Current Situation/Hospitalization:  No - Comment as needed Significant Relationships:  Adult Children Lives with:  Self Do you feel safe going back to the place where you live?  Yes Need for family participation in patient care:  Yes (Comment) (Per notes, pt with memory impairment)  Care giving concerns:  Per notes, pt with new visual and cognitive deficits. Question of pt safety home alone.   Social Worker assessment / plan:  CSW spoke with pt daughter over the phone. CSW explained recommendation and that CSW reviewed case with supervisor and at this time we do not believe Medicare will cover a SNF stay. Pt daughter informed CSW this was fine as she does not think pt will want to dc to SNF. Per pt daughter, pt has long term insurance that has a benefit that include home care. Pt daughter to look through the insurance information and confirm. CSW did explain that RNCM can provide list of private care providers as well as potentially home health. Pt daughter thankful for CSW phone call and information. At this time, pt has no further hospital social work needs. CSW signing off.  Employment status:  Retired Forensic scientist:  [employer] PT Recommendations:  Research officer, trade union, [place] / Referral to community resources:   (None needed at  this time)  Patient/Family's Response to care:  Pt daughter states that plan will be for pt to dc home and she will arrange 24 supervision as needed.  Patient/Family's Understanding of and Emotional Response to Diagnosis, Current Treatment, and Prognosis: Pt daughter has fair insight on pt condition. Pt daughter calm and had appropriate emotional response through out conversation. She did express that this has been a lot at one time but she is coping.  Emotional Assessment Appearance:  Well-Groomed Attitude/Demeanor/Rapport:  Unable to Assess Affect (typically observed):  Unable to Assess Orientation:  Oriented to Self, Oriented to Place, Oriented to  Time Alcohol / Substance use:  Other, Not Applicable Psych involvement (Current and /or in the community):  No (Comment)  Discharge Needs  Concerns to be addressed:  Discharge Planning Concerns Readmission within the last 30 days:  No Current discharge risk:  Cognitively Impaired Barriers to Discharge:  Continued Medical Work up   [employer], [place]

## 2014-12-25 NOTE — Progress Notes (Signed)
[place] TEAM 1 - Stepdown/ICU TEAM PROGRESS NOTE  Maria CHIAO XTK:[id number] DOB: 10-16-1924 DOA: 12/20/2014 PCP: [name], MD  Admit HPI / Brief Narrative: 81 y.o. female with HTN, HLD, and a hx of skin cancer who presented to [facility] ED August 26th, 2016 for evaluation of a syncopal episode that occurred the morning prior to the admission. She passed out and then was unable to get up and was only able to crawl towards the telephone to call her daughter. Daughter noted some blood in pt's underwear. Daughter reported pt had been taking Ibuprofen for pain over the last 2 weeks.  In ED pt was noted to be somnolent with initial BP 61/33, HR 130's, RR 36. Blood work notable for WBC 11 K, Hg 9.5, Cr 1.36.   HPI/Subjective: The patient is alert and oriented and conversant.  She denies any speech difficulty, headache, chest pain, shortness of breath, nausea, or vomiting.  She reports to me today however that at some point during her hospital stay she has lost peripheral vision in both eyes.  She cannot tell me exactly when this occurred.  She feels strongly that this was not present when she was admitted/at home.  She denies pain in the eyes or double vision.  Assessment/Plan:  New onset expressive aphasia and left facial droop 8/28 - resolved - Code Stroke called 8/28 - sx and distribution not completely c/w a CVA - CT head w/o acute findings - CTA head and neck negative for large vessel stenosis or occlusion (no MRI due to pacer) - would not be able to anticoag or use antiplt meds at this time given very large ulcer - clinically pt has completely recovered - Neuro has suggested resuming ASA 81QD when safe   ? Bitemporal hemianopsia - The history is quite difficult to obtain with great accuracy in this patient and her exam is somewhat variable but it does appear that she is suffering with loss of bilateral temporal visual fields - this would suggest a lesion at the area of the  optic chiasm such as a pituitary adenoma are even potential meningioma - unfortunately we cannot obtain an MRI given her pacemaker - she has had 2 CT head scans accomplished without evidence of a mass and one would expect a CT angiogram to have revealed an aneurysm in this area - I will check a prolactin level as well as a TSH as an initial workup for the possibility of an adenoma   Syncope - secondary to acute GI bleed  Acute GI bleed, acute blood loss anemia - large gastric ulcer  - secondary to large prepyloric gastric ulcer noted on EGD 12/20/2014 - high risk of re bleeding - continue BID PPI therapy and monitor Hgb - had positive antibody for H. pylori but urease test was negative indicating no active infection - pt will need to see Dr. [name] in about 6 weeks to arrange repeat EGD to document healing of the large gastric ulcer  Acute thrombocytopenia - from acute blood loss anemia / consumption  - resolved    Acute kidney injury - secondary to acute bleeding - renal fxn has fluctuated but appears to be on a downward trend again  Complete heart block s/p PPM 2009 - currently asymptomatic  Accelerated hypertension - resume home meds but hold on ARB as renal function continues to vacillate  E coli UTI  - to complete a 7 day course of abx  Hyperglycemia - A1C 5.7, therefore not c/w DM  Code Status: FULL Family Communication: no family present at time of exam Disposition Plan: SDU   Consultants: [facility]   Procedures: EGD done 12/20/2014 #1 giant prepyloric gastric ulcer with high risk of re-bleeding  #2 small clean-based antral and duodenal ulcersand erosions  #3 moderately large hiatal hernia  Antibiotics: Rocephin 8/27 >  DVT prophylaxis: SCDs  Objective: Blood pressure 147/55, pulse 77, temperature 97.9 F (36.6 C), temperature source Oral, resp. rate 19, height 5' 4.5" (1.638 m), weight 58 kg (127 lb 13.9 oz), SpO2 97 %.  Intake/Output Summary  (Last 24 hours) at 12/25/14 1544 Last data filed at 12/25/14 1448  Gross per 24 hour  Intake    530 ml  Output   1400 ml  Net   -870 ml   Exam: General: No acute respiratory distress - alert and conversant Lungs: Clear to auscultation bilaterally without wheezes or crackles Cardiovascular: Regular rate and rhythm without murmur gallop or rub normal S1 and S2 Abdomen: Nontender, nondistended, soft, bowel sounds positive, no rebound, no ascites, no appreciable mass Extremities: No significant cyanosis, clubbing, or edema bilateral lower extremities Neuro:  Alert and conversant, voice strong and clear, no appreciable facial droop, no aphasia, moves all 4 ext w/o difficulty or clear deficit - visual fields assessed by confrontation suggesting a possible bitemporal hemianopsia but the results very when the test is repeated and its results are questionable  Data Reviewed: Basic Metabolic Panel:  Recent Labs Lab 12/21/14 0533 12/22/14 0655 12/23/14 0240 12/24/14 0303 12/25/14 0005  NA 138 138 140 135 137  K 4.2 4.0 3.9 4.0 3.7  CL 107 106 110 105 108  CO2 24 24 26  20* 22  GLUCOSE 104* 105* 116* 133* 139*  BUN 68* 41* 25* 42* 47*  CREATININE 1.36* 1.11* 0.99 1.90* 1.64*  CALCIUM 9.3 9.4 9.3 9.2 9.1  MG  --   --   --   --  1.5*    CBC:  Recent Labs Lab 12/20/14 1036  12/21/14 1819 12/22/14 0107 12/22/14 0655 12/22/14 1132 12/22/14 1808 12/23/14 0240 12/24/14 0303  WBC 13.3*  < > 10.9* 10.5  --   --  9.1 7.5 14.0*  NEUTROABS 10.3*  --   --   --   --   --  7.0  --   --   HGB 10.8*  < > 9.8* 9.3* 9.7* 9.4* 9.9* 9.3* 10.8*  HCT 31.1*  < > 28.5* 26.5* 28.0* 26.7* 28.3* 27.0* 32.2*  MCV 86.6  < > 85.8 84.9  --   --  85.5 84.9 87.0  PLT 179  < > 156 151  --   --  162 168 219  < > = values in this interval not displayed.  Liver Function Tests:  Recent Labs Lab 12/20/14 1036 12/21/14 0533 12/24/14 0303  AST 18 17 19   ALT 14 12* 11*  ALKPHOS 30* 25* 34*  BILITOT 0.5 0.8  0.5  PROT 5.2* 4.6* 5.5*  ALBUMIN 2.8* 2.4* 2.9*   CBG:  Recent Labs Lab 12/20/14 1010  GLUCAP 106*    Recent Results (from the past 240 hour(s))  MRSA PCR Screening     Status: None   Collection Time: 12/20/14  3:22 PM  Result Value Ref Range Status   MRSA by PCR NEGATIVE NEGATIVE Final    Comment:        The GeneXpert MRSA Assay (FDA approved for NASAL specimens only), is one component of a comprehensive MRSA colonization surveillance program.  It is not intended to diagnose MRSA infection nor to guide or monitor treatment for MRSA infections.   Urine culture     Status: None   Collection Time: 12/21/14  9:48 PM  Result Value Ref Range Status   Specimen Description URINE, CLEAN CATCH  Final   Special Requests NONE  Final   Culture >=100,000 COLONIES/mL ESCHERICHIA COLI  Final   Report Status 12/23/2014 FINAL  Final   Organism ID, Bacteria ESCHERICHIA COLI  Final      Susceptibility   Escherichia coli - MIC*    AMPICILLIN >=32 RESISTANT Resistant     CEFAZOLIN <=4 SENSITIVE Sensitive     CEFTRIAXONE <=1 SENSITIVE Sensitive     CIPROFLOXACIN 1 SENSITIVE Sensitive     GENTAMICIN <=1 SENSITIVE Sensitive     IMIPENEM <=0.25 SENSITIVE Sensitive     NITROFURANTOIN <=16 SENSITIVE Sensitive     TRIMETH/SULFA >=320 RESISTANT Resistant     AMPICILLIN/SULBACTAM 16 INTERMEDIATE Intermediate     PIP/TAZO <=4 SENSITIVE Sensitive     * >=100,000 COLONIES/mL ESCHERICHIA COLI     Studies:   Recent x-ray studies have been reviewed in detail by the Attending Physician  Scheduled Meds:  Scheduled Meds: .  stroke: mapping our early stages of recovery book   Does not apply Once  . amLODipine  5 mg Oral Daily  . cefTRIAXone (ROCEPHIN)  IV  1 g Intravenous Q24H  . cyanocobalamin  500 mcg Oral Daily  . hydrALAZINE  25 mg Oral 3 times per day  . pantoprazole  40 mg Oral BID  . pneumococcal 23 valent vaccine  0.5 mL Intramuscular Tomorrow-1000    Time spent on care of this  patient: 35 mins   MCCLUNG,JEFFREY T , MD   [facility] Office  [phone] Pager - Text Page per Shea Evans as per below:  On-Call/Text Page:      [email or web address]      password [email or web address]  If 7PM-7AM, please contact night-coverage [email or web address] Password [email or web address] 12/25/2014, 3:44 PM   LOS: 5 days

## 2014-12-25 NOTE — Progress Notes (Signed)
Pt complaining on right arm numbness and tingling. Performed a series of stroke exams, pt completed all without complication. Bilateral grip present and equal, negative pronator drift, smile symmetrical, speech not slurred at thsi time. Notified MD, he stated to continue to watch and monitor pt at this time.

## 2014-12-25 NOTE — Progress Notes (Signed)
Occupational Therapy Treatment Patient Details Name: [name] MRN: [id number] DOB: 1924/06/10 Today's Date: 12/25/2014    History of present illness [name] is a 81 y.o. female with a past medical history significant for hypertension, dyslipidemia, chronic back pain and history of skin cancer; who presented to the ED secondary to syncopal episode and GIB. 12/22/14  pt with aphasia and mild left-sided facial droop.   OT comments  Pt demonstrates improving awareness of deficits.  She is able to scan static environment to locate items with supervision.  She is unable to read clock accurately.  Horizontal beating nystagmus noted with Lt gaze   Follow Up Recommendations  Home health OT;Supervision/Assistance - 24 hour    Equipment Recommendations  None recommended by OT    Recommendations for Other Services      Precautions / Restrictions Precautions Precautions: Fall Precaution Comments: right homonymous hemianopsia       Mobility Bed Mobility Overal bed mobility: Modified Independent                Transfers Overall transfer level: Needs assistance Equipment used: 1 person hand held assist Transfers: Sit to/from Stand;Stand Pivot Transfers Sit to Stand: Supervision Stand pivot transfers: Supervision            Balance Overall balance assessment: Needs assistance Sitting-balance support: Feet supported Sitting balance-Leahy Scale: Good     Standing balance support: During functional activity Standing balance-Leahy Scale: Good                     ADL Overall ADL's : Needs assistance/impaired     Grooming: Wash/dry hands;Wash/dry face;Brushing hair;Supervision/safety;Standing Grooming Details (indicate cue type and reason):  (Pt able to locate needed items )     Lower Body Bathing: Supervison/ safety;Sit to/from stand       Lower Body Dressing: Supervision/safety;Sit to/from stand   Toilet Transfer: Min  guard;Ambulation;Comfort height toilet           Functional mobility during ADLs: Supervision/safety;Min guard General ADL Comments: requires min guard to supervision for functional mobility due to visual deficits.  Pt is very aware of deficits this date, and demonstrates good safety awareness.  Long discussion with her re: compensations for visual loss and need for 24 hour supervision due to [place]                 Additional Comments: Pt was able to locate clock on wall (on Rt) without cues.  But she was unable to accurately read the clock.  SHe was able to verbalize where the clock hands were located, but unable to tell the correct time.  She is able to read very large print, but leaves out words.  She states "I can't keep up with the words".   Rt HH continues.  Pt with horizontal nystagmus with Lt gaze    Perception     Praxis      Cognition   Behavior During Therapy: WFL for tasks assessed/performed Overall Cognitive Status: Within Functional Limits for tasks assessed                  General Comments: Cognition appeared appropriate for basic info.  She was able to state deficits.  She was able to follow all directions, and able to recall details of conversations with other providiers    Extremity/Trunk Assessment               Exercises  Shoulder Instructions       General Comments      Pertinent Vitals/ Pain       Pain Assessment: No/denies pain  Home Living                                          Prior Functioning/Environment              Frequency Min 3X/week     Progress Toward Goals  OT Goals(current goals can now be found in the care plan section)  Progress towards OT goals: Progressing toward goals     Plan Discharge plan needs to be updated    Co-evaluation                 End of Session     Activity Tolerance Patient tolerated treatment well   Patient Left in bed;with call  bell/phone within reach;with bed alarm set   Nurse Communication Mobility status        Time: 1451-1520 OT Time Calculation (min): 29 min  Charges: OT General Charges $OT Visit: 1 Procedure OT Treatments $Self Care/Home Management : 8-22 mins $Therapeutic Activity: 8-22 mins  Kaleena Corrow M 12/25/2014, 4:50 PM

## 2014-12-26 ENCOUNTER — Encounter: Payer: Self-pay | Admitting: Cardiology

## 2014-12-26 DIAGNOSIS — K922 Gastrointestinal hemorrhage, unspecified: Secondary | ICD-10-CM | POA: Diagnosis present

## 2014-12-26 LAB — BASIC METABOLIC PANEL
ANION GAP: 5 (ref 5–15)
BUN: 35 mg/dL — ABNORMAL HIGH (ref 6–20)
CO2: 25 mmol/L (ref 22–32)
Calcium: 9 mg/dL (ref 8.9–10.3)
Chloride: 111 mmol/L (ref 101–111)
Creatinine, Ser: 1.1 mg/dL — ABNORMAL HIGH (ref 0.44–1.00)
GFR calc Af Amer: 50 mL/min — ABNORMAL LOW (ref >60)
GFR calc non Af Amer: 44 mL/min — ABNORMAL LOW (ref >60)
GLUCOSE: 109 mg/dL — AB (ref 65–99)
POTASSIUM: 3.6 mmol/L (ref 3.5–5.1)
Sodium: 141 mmol/L (ref 135–145)

## 2014-12-26 LAB — HEMOGLOBIN AND HEMATOCRIT, BLOOD
HCT: 28.4 % — ABNORMAL LOW (ref 36.0–46.0)
Hemoglobin: 9.7 g/dL — ABNORMAL LOW (ref 12.0–15.0)

## 2014-12-26 LAB — OCCULT BLOOD X 1 CARD TO LAB, STOOL: Fecal Occult Bld: POSITIVE — AB

## 2014-12-26 LAB — CBC
HEMATOCRIT: 25.9 % — AB (ref 36.0–46.0)
HEMOGLOBIN: 8.8 g/dL — AB (ref 12.0–15.0)
MCH: 29.6 pg (ref 26.0–34.0)
MCHC: 34 g/dL (ref 30.0–36.0)
MCV: 87.2 fL (ref 78.0–100.0)
Platelets: 183 10*3/uL (ref 150–400)
RBC: 2.97 MIL/uL — ABNORMAL LOW (ref 3.87–5.11)
RDW: 16 % — ABNORMAL HIGH (ref 11.5–15.5)
WBC: 7 10*3/uL (ref 4.0–10.5)

## 2014-12-26 LAB — PROLACTIN: Prolactin: 17.7 ng/mL (ref 4.8–23.3)

## 2014-12-26 MED ORDER — INFLUENZA VAC SPLIT QUAD 0.5 ML IM SUSY
0.5000 mL | PREFILLED_SYRINGE | INTRAMUSCULAR | Status: AC
  Administered 2014-12-27: 0.5 mL via INTRAMUSCULAR
  Filled 2014-12-26: qty 0.5

## 2014-12-26 NOTE — Progress Notes (Signed)
Nutrition Follow-up  DOCUMENTATION CODES:   Not applicable  INTERVENTION:   -Continue with soft diet  NUTRITION DIAGNOSIS:   Inadequate oral intake related to inability to eat as evidenced by NPO status.  Resolved  No new nutrition dx at this time  GOAL:   Patient will meet greater than or equal to 90% of their needs  Met  MONITOR:   PO intake, Diet advancement, Labs, Weight trends, Skin, I & O's  REASON FOR ASSESSMENT:   Consult Assessment of nutrition requirement/status  ASSESSMENT:   [name] is a 81 y.o. female with a past medical history significant for hypertension, dyslipidemia, chronic back pain and history of skin cancer; who presented to the ED secondary to syncope episode. Patient reports that she was feeling okay until this morning when she was walking toward the kitchen to feed her dog and black out. Patient was initially unable to get back on her feet for a while and then ended crawling towards her telephone and call her daughter. When the daughter arrived she noticed son blood in the patient's underwear and the patient reports some abdominal pain. There was no signs of postictal, tongue bite, stool or urine incontinence appreciated on the floor. Patient denies chest pain, fever, chills, nausea, vomiting, hematemesis, dysuria or any other acute   Pt was using restroom at time of visit.   Pt has been advanced to a soft diet. Intake remains good; noted 50-80% meal completion (averaging 75% of meal completion).   Neurology has signed off. SLP continues to work with pt due to moderate to significant deficits relates to vision, memory, awareness, problems solving, and reasoning.   CSW following. Plan is to discharge home with 24 hours care, per pt daughter.   Labs reviewed: Mg: 1.5.   Diet Order:  DIET SOFT Room service appropriate?: Yes; Fluid consistency:: Thin  Skin:  Reviewed, no issues  Last BM:  12/25/14  Height:   Ht Readings from Last  1 Encounters:  12/20/14 5' 4.5" (1.638 m)    Weight:   Wt Readings from Last 1 Encounters:  12/26/14 128 lb 15.5 oz (58.5 kg)    Ideal Body Weight:  55.9 kg  BMI:  Body mass index is 21.8 kg/(m^2).  Estimated Nutritional Needs:   Kcal:  1400-1600  Protein:  60-70 grams  Fluid:  >1.4 L  EDUCATION NEEDS:   Education needs addressed  [name] A. Jimmye Norman, RD, LDN, CDE Pager: [phone] After hours Pager: [phone]

## 2014-12-26 NOTE — Progress Notes (Signed)
[place] TEAM 1 - Stepdown/ICU TEAM Progress Note  AMRIT ERCK QBH:[id number] DOB: January 08, 1925 DOA: 12/20/2014 PCP: [name], MD  Admit HPI / Brief Narrative: 81y.o. WF PMHx Anxiety, Depression, HTN, HLD, extension tube dysfunction, S/P pacemaker? and a hx of skin cancer   Presented to [facility] ED August 26th, 2016 for evaluation of a syncopal episode that occurred the morning prior to the admission. She explained, she was unable to get up and was only able to crawl towards the telephone to call her daughter. Daughter noted some blood in pt's underwear. Daughter reported pt had been taking Ibuprofen for pain over the last 2 weeks.  In ED pt was noted to be somnolent with initial BP 61/33, HR 130's, RR 36. Blood work notable for WBC 11 K, Hg 9.5, Cr 1.36.     HPI/Subjective: 9/1 A/O 4,  patient states ready to go home/rehabilitation  Assessment/Plan:  New onset expressive aphasia and left facial droop 8/28; acute stroke? 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 Prior to discharge would touch base with stroke team, do not believe the risk of MRI is worse again. -Counseled patient she cannot drive until she sees an ophthalmologist for official evaluation. -PT/OT recommends home health  Syncope - secondary to acute GI bleed -Stable  Acute GI bleed, acute blood loss anemia - large gastric ulcer - secondary to large prepyloric gastric ulcer noted on EGD 12/20/2014 - continue Protonix 40 mg BID - had positive antibody for H. pylori but urease test was negative indicating no active infection - pt will need to see Dr. [name] in about 6 weeks to arrange repeat EGD to document  healing of the large gastric ulcer -Hemoglobin stable  Acute thrombocytopenia - Resolved   Acute kidney injury - Secondary to acute blood loss anemia; Improving  Complete heart block s/p PPM 2009 - currently asymptomatic - monitored on telemetry   Accelerated hypertension -Hold ARB  secondary to acute kidney injury -Decrease amlodipine to 5 mg daily; as 81 year old BP situationally hypotensive. Would allow SBP to run~140-150   E coli UTI  - to complete a 7 day course of abx  Hyperglycemia - A1C 5.7, therefore not c/w DM   Code Status: FULL Family Communication: no family present at time of exam Disposition Plan: CIR vs SNF    Consultants: Dr.Jindong Xu (stroke team) Dr.James Oletta Lamas [facility] GI)   Procedure/Significant Events: EGD done 12/20/2014 #1 giant prepyloric gastric ulcer with high risk of re-bleeding #2 small clean-based antral and duodenal ulcersand erosions #3 moderately large hiatal hernia 8/29 CT angiogram neck and head;No CT evidence of large acute infarct although evaluation for detection of small or mediumsize infarct is limited by the white matter changes. -60% diameter stenosis proximal left internal carotid artery.   Culture 8/26 MRSA by PCR negative 8/27 urine positive Escherichia coli   Antibiotics: Rocephin 8/27 > stop 9/2  DVT prophylaxis: SCD   Devices    LINES / TUBES:      Continuous Infusions:   Objective: VITAL SIGNS: Temp: 98.3 F (36.8 C) (09/01 1945) Temp Source: Oral (09/01 1945) BP: 151/62 mmHg (09/01 2115) Pulse Rate: 84 (09/01 2000) SPO2; FIO2:   Intake/Output Summary (Last  24 hours) at 12/26/14 2248 Last data filed at 12/26/14 2100  Gross per 24 hour  Intake    770 ml  Output   1300 ml  Net   -530 ml     Exam: General:A/O 4, No acute respiratory distress Eyes: Negative headache, eye pain, double vision,negative scleral hemorrhage ENT: Negative Runny nose, negative ear pain, negative tinnitus,  negative gingival bleeding Neck:  Negative scars, masses, torticollis, lymphadenopathy, JVD Lungs: Clear to auscultation bilaterally without wheezes or crackles Cardiovascular: Regular rate and rhythm without murmur gallop or rub normal S1 and S2 Abdomen:negative abdominal pain, negative dysphagia, nondistended, positive soft, bowel sounds, no rebound, no ascites, no appreciable mass Extremities: No significant cyanosis, clubbing, or edema bilateral lower extremities Psychiatric:  Negative depression, negative anxiety, negative fatigue, negative mania  Neurologic:  Cranial nerves II through XII intact, tongue/uvula midline, all extremities muscle strength 5/5, sensation intact throughout, negative dysarthria, negative expressive aphasia, negative receptive aphasia. Bitemporal hemianopsia   Data Reviewed: Basic Metabolic Panel:  Recent Labs Lab 12/22/14 0655 12/23/14 0240 12/24/14 0303 12/25/14 0005 12/26/14 0531  NA 138 140 135 137 141  K 4.0 3.9 4.0 3.7 3.6  CL 106 110 105 108 111  CO2 24 26 20* 22 25  GLUCOSE 105* 116* 133* 139* 109*  BUN 41* 25* 42* 47* 35*  CREATININE 1.11* 0.99 1.90* 1.64* 1.10*  CALCIUM 9.4 9.3 9.2 9.1 9.0  MG  --   --   --  1.5*  --    Liver Function Tests:  Recent Labs Lab 12/20/14 1036 12/21/14 0533 12/24/14 0303  AST 18 17 19   ALT 14 12* 11*  ALKPHOS 30* 25* 34*  BILITOT 0.5 0.8 0.5  PROT 5.2* 4.6* 5.5*  ALBUMIN 2.8* 2.4* 2.9*   No results for input(s): LIPASE, AMYLASE in the last 168 hours. No results for input(s): AMMONIA in the last 168 hours. CBC:  Recent Labs Lab 12/20/14 1036  12/22/14 0107  12/22/14 1808 12/23/14 0240 12/24/14 0303 12/26/14 0531 12/26/14 1535  WBC 13.3*  < > 10.5  --  9.1 7.5 14.0* 7.0  --   NEUTROABS 10.3*  --   --   --  7.0  --   --   --   --   HGB 10.8*  < > 9.3*  < > 9.9* 9.3* 10.8* 8.8* 9.7*  HCT 31.1*  < > 26.5*  < > 28.3* 27.0* 32.2* 25.9* 28.4*  MCV 86.6  < > 84.9  --  85.5 84.9 87.0 87.2  --     PLT 179  < > 151  --  162 168 219 183  --   < > = values in this interval not displayed. Cardiac Enzymes: No results for input(s): CKTOTAL, CKMB, CKMBINDEX, TROPONINI in the last 168 hours. BNP (last 3 results) No results for input(s): BNP in the last 8760 hours.  ProBNP (last 3 results) No results for input(s): PROBNP in the last 8760 hours.  CBG:  Recent Labs Lab 12/20/14 1010  GLUCAP 106*    Recent Results (from the past 240 hour(s))  MRSA PCR Screening     Status: None   Collection Time: 12/20/14  3:22 PM  Result Value Ref Range Status   MRSA by PCR NEGATIVE NEGATIVE Final    Comment:        The GeneXpert MRSA Assay (FDA approved for NASAL specimens only), is one component of a comprehensive MRSA colonization surveillance program. It is not intended to diagnose MRSA infection  nor to guide or monitor treatment for MRSA infections.   Urine culture     Status: None   Collection Time: 12/21/14  9:48 PM  Result Value Ref Range Status   Specimen Description URINE, CLEAN CATCH  Final   Special Requests NONE  Final   Culture >=100,000 COLONIES/mL ESCHERICHIA COLI  Final   Report Status 12/23/2014 FINAL  Final   Organism ID, Bacteria ESCHERICHIA COLI  Final      Susceptibility   Escherichia coli - MIC*    AMPICILLIN >=32 RESISTANT Resistant     CEFAZOLIN <=4 SENSITIVE Sensitive     CEFTRIAXONE <=1 SENSITIVE Sensitive     CIPROFLOXACIN 1 SENSITIVE Sensitive     GENTAMICIN <=1 SENSITIVE Sensitive     IMIPENEM <=0.25 SENSITIVE Sensitive     NITROFURANTOIN <=16 SENSITIVE Sensitive     TRIMETH/SULFA >=320 RESISTANT Resistant     AMPICILLIN/SULBACTAM 16 INTERMEDIATE Intermediate     PIP/TAZO <=4 SENSITIVE Sensitive     * >=100,000 COLONIES/mL ESCHERICHIA COLI     Studies:  Recent x-ray studies have been reviewed in detail by the Attending Physician  Scheduled Meds:  Scheduled Meds: . amLODipine  5 mg Oral Daily  . cefTRIAXone (ROCEPHIN)  IV  1 g Intravenous  Q24H  . cyanocobalamin  500 mcg Oral Daily  . hydrALAZINE  25 mg Oral 3 times per day  . [START ON 12/27/2014] Influenza vac split quadrivalent PF  0.5 mL Intramuscular Tomorrow-1000  . pantoprazole  40 mg Oral BID  . pneumococcal 23 valent vaccine  0.5 mL Intramuscular Tomorrow-1000    Time spent on care of this patient: 40 mins   Rheba Diamond, Geraldo Docker , MD  [facility] Office  [phone] Pager - [phone]  On-Call/Text Page:      [email or web address]      password [email or web address]  If 7PM-7AM, please contact night-coverage [email or web address] Password [email or web address] 12/26/2014, 10:48 PM   LOS: 6 days   Care during the described time interval was provided by me .  I have reviewed this patient's available data, including medical history, events of note, physical examination, and all test results as part of my evaluation. I have personally reviewed and interpreted all radiology studies.   Dia Crawford, MD [phone] Pager

## 2014-12-26 NOTE — Care Management Important Message (Signed)
Important Message  Patient Details  Name: [name] MRN: [id number] Date of Birth: 1924/12/20   Medicare Important Message Given:  Yes-third notification given    Pricilla Handler 12/26/2014, 11:09 AM

## 2014-12-26 NOTE — Progress Notes (Signed)
Physical Therapy Treatment Patient Details Name: [name] MRN: [id number] DOB: 1924/11/19 Today's Date: 12/26/2014    History of Present Illness [name] is a 81 y.o. female with a past medical history significant for hypertension, dyslipidemia, chronic back pain and history of skin cancer; who presented to the ED secondary to syncopal episode and GIB. 12/22/14  pt with aphasia and mild left-sided facial droop.    PT Comments    Pt with improved cognition, awareness and functional gait today. Pt able to state she has hemianopsia but stated it is on the left not right. Pt able to ambulate in hall with only one instance of hitting obstacle, was able to read the clock and room numbers accurately today. Pt continues to demonstrate balance deficits and fall risk with 39/56 on Berg balance assessment. Pt states she will stay with dgtr at D/C and have 24hr supervision. Will continue to follow  Follow Up Recommendations  Home health PT;Supervision/Assistance - 24 hour     Equipment Recommendations       Recommendations for Other Services       Precautions / Restrictions Precautions Precautions: Fall Precaution Comments: right homonymous hemianopsia Restrictions Weight Bearing Restrictions: No    Mobility  Bed Mobility Overal bed mobility: Modified Independent                Transfers       Sit to Stand: Supervision         General transfer comment: supervision for safety and lines and cues for hand placement  Ambulation/Gait Ambulation/Gait assistance: Supervision Ambulation Distance (Feet): 600 Feet Assistive device: Rolling walker (2 wheeled) Gait Pattern/deviations: Step-through pattern;Decreased stride length   Gait velocity interpretation: Below normal speed for age/gender General Gait Details: cues for position in RW, pt running into obstacles to the right x 1, able to recall room number and only needed min cues to look for numbers to find  room   Stairs Stairs: Yes Stairs assistance: Min guard Stair Management: One rail Left;Alternating pattern;Forwards Number of Stairs: 5    Wheelchair Mobility    Modified Rankin (Stroke Patients Only)       Balance Overall balance assessment: Needs assistance   Sitting balance-Leahy Scale: Good       Standing balance-Leahy Scale: Fair                      Cognition Arousal/Alertness: Awake/alert Behavior During Therapy: WFL for tasks assessed/performed Overall Cognitive Status: Within Functional Limits for tasks assessed                 General Comments: much improved from last PT visit, able to recall room number, date, deficits and plan for assist at home    Exercises      General Comments        Pertinent Vitals/Pain Pain Assessment: No/denies pain    Home Living                      Prior Function            PT Goals (current goals can now be found in the care plan section) Progress towards PT goals: Progressing toward goals    Frequency       PT Plan Discharge plan needs to be updated    Co-evaluation             End of Session   Activity Tolerance: Patient tolerated treatment well Patient left:  in chair;with call bell/phone within reach;with chair alarm set     Time: 0802-0829 PT Time Calculation (min) (ACUTE ONLY): 27 min  Charges:  $Gait Training: 8-22 mins $Physical Performance Test: 8-22 mins                    G Codes:      Melford Aase 2015/01/25, 9:34 AM Elwyn Reach, [place]

## 2014-12-27 ENCOUNTER — Inpatient Hospital Stay (HOSPITAL_COMMUNITY): Payer: Medicare Other

## 2014-12-27 ENCOUNTER — Encounter (HOSPITAL_COMMUNITY): Payer: Self-pay | Admitting: Radiology

## 2014-12-27 DIAGNOSIS — H5347 Heteronymous bilateral field defects: Secondary | ICD-10-CM

## 2014-12-27 DIAGNOSIS — I63432 Cerebral infarction due to embolism of left posterior cerebral artery: Secondary | ICD-10-CM | POA: Diagnosis not present

## 2014-12-27 DIAGNOSIS — K254 Chronic or unspecified gastric ulcer with hemorrhage: Secondary | ICD-10-CM | POA: Diagnosis not present

## 2014-12-27 MED ORDER — IOHEXOL 350 MG/ML SOLN
50.0000 mL | Freq: Once | INTRAVENOUS | Status: AC | PRN
  Administered 2014-12-27: 50 mL via INTRAVENOUS

## 2014-12-27 NOTE — Care Management Note (Signed)
Case Management Note  Patient Details  Name: [name] MRN: [id number] Date of Birth: [birth year]  Subjective/Objective:       Adm w gi bleed, stroke symptoms             Action/Plan: lives at home alone, da lives close and very supportive. Went over [employer] and da [facility] for [facility], hhpt,[facility]ath aid.   Expected Discharge Date:                  Expected Discharge Plan:  [place]  In-House Referral:  Clinical Social Work  Discharge planning Services  CM Consult  Post Acute Care Choice:  Home Health Choice offered to:  Adult Children  DME Arranged:    DME Agency:     HH Arranged:  RN, PT, Nurse's Aide Ward Agency:  [place]  Status of Service:     Medicare Important Message Given:  Yes-third notification given Date Medicare IM Given:    Medicare IM give by:    Date Additional Medicare IM Given:    Additional Medicare Important Message give by:     If discussed at [place] of Stay Meetings, dates discussed:    Additional Comments: da has sitter priv duty list if they want to hire addit assistance. Made ref to donna w adv homecare in case pt disch over weekend.  Lacretia Leigh, RN 12/27/2014, 10:34 AM

## 2014-12-27 NOTE — Progress Notes (Signed)
Occupational Therapy Treatment Patient Details Name: [name] MRN: [id number] DOB: 1924/07/04 Today's Date: 12/27/2014    History of present illness VISTA SAWATZKY is a 81 y.o. female with a past medical history significant for hypertension, dyslipidemia, chronic back pain and history of skin cancer; who presented to the ED secondary to syncopal episode and GIB. 12/22/14  pt with aphasia and mild left-sided facial droop. Also noted by OT on 12/23/14 pt with right homonoymous hemianposia.   OT comments  This 81 yo female making progress, actually has met her goals, so I will revise them. Will continue to benefit from acute OT with follow up HHOT and 24 hour S/prn A.   Follow Up Recommendations  Home health OT;Supervision/Assistance - 24 hour    Equipment Recommendations  None recommended by OT       Precautions / Restrictions Precautions Precautions: Fall Precaution Comments: right homonymous hemianopsia Restrictions Weight Bearing Restrictions: No       Mobility Bed Mobility Overal bed mobility: Modified Independent             General bed mobility comments: Pt up in recliner upon my arrival with chair alarm on  Transfers Overall transfer level: Needs assistance Equipment used: Rolling walker (2 wheeled) Transfers: Sit to/from Stand Sit to Stand: Supervision            Balance Overall balance assessment: Needs assistance       Standing balance support: Single extremity supported;During functional activity Standing balance-Leahy Scale: Fair Standing balance comment: Pt while in standing and washing her lower legs she naturally held onto something to steady herself while she bent foward                ADL Overall ADL's : Needs assistance/impaired     Grooming: Wash/dry face;Wash/dry hands;Brushing hair;Supervision/safety;Set up;Standing Grooming Details (indicate cue type and reason): No cues to find items Upper Body Bathing: Supervision/  safety;Set up;Standing Upper Body Bathing Details (indicate cue type and reason): VC to find towel to her right (she first found the washcloths on her right side, but could not find the towel which was more to her far right and lower than the washcloths. Lower Body Bathing: Supervison/ safety;Set up;Sit to/from stand   Upper Body Dressing : Set up;Supervision/safety;Sitting   Lower Body Dressing: Supervision/safety;Set up;Sit to/from stand                        Vision                 Additional Comments: Had pt walk 2/3 way around the unit and stop in front of bulletin boards and find items I asked her to locate visually on them. She consistently missed items to her far right. She did not however run into or come close to running into anything while she was ambulating wtih her RW          Cognition   Behavior During Therapy: [facility] for tasks assessed/performed Overall Cognitive Status: Impaired/Different from baseline            Safety/Judgement: Decreased awareness of deficits (and always looking far right without cues to find items she is not naturally seeing due to her vision deficit)   Problem Solving: Requires verbal cues (She can tell me she needs to look more to her right to find items that she is not naturally seeing, but she does not consisently do this without cueing)  Exercises General Exercises - Lower Extremity Long Arc Quad: AROM;Seated;Both;20 reps Hip Flexion/Marching: AROM;Seated;Both;20 reps Toe Raises: AROM;Seated;Both;20 reps           Pertinent Vitals/ Pain       Pain Assessment: No/denies pain         Frequency Min 3X/week     Progress Toward Goals  OT Goals(current goals can now be found in the care plan section)  Progress towards OT goals: Progressing toward goals     Plan Discharge plan remains appropriate       End of Session Equipment Utilized During Treatment: Rolling walker   Activity Tolerance Patient  tolerated treatment well   Patient Left in chair;with call bell/phone within reach;with chair alarm set (she needed VCs to find her call bell which she knew she needed to call if she needed help but could not find it (it was siting in her lap in plain sight)   Nurse Communication          Time: 6219-4712 OT Time Calculation (min): 31 min  Charges: OT General Charges $OT Visit: 1 Procedure OT Treatments $Self Care/Home Management : 23-37 mins  Almon Register [phone] 12/27/2014, 12:33 PM

## 2014-12-27 NOTE — Progress Notes (Signed)
Physical Therapy Treatment Patient Details Name: [name] MRN: [id number] DOB: January 13, 1925 Today's Date: 12/27/2014    History of Present Illness [name] is a 81 y.o. female with a past medical history significant for hypertension, dyslipidemia, chronic back pain and history of skin cancer; who presented to the ED secondary to syncopal episode and GIB. 12/22/14  pt with aphasia and mild left-sided facial droop.    PT Comments    Pt with continued improvement with gait, functional activities and awareness of her environment. Pt ambulated without running into obstacles today, able to read room number and clock and verbalize her balance deficits with need for RW. Will continue to follow.   Follow Up Recommendations  Home health PT;Supervision/Assistance - 24 hour     Equipment Recommendations       Recommendations for Other Services       Precautions / Restrictions Precautions Precautions: Fall Precaution Comments: right homonymous hemianopsia Restrictions Weight Bearing Restrictions: No    Mobility  Bed Mobility Overal bed mobility: Modified Independent                Transfers Overall transfer level: Needs assistance     Sit to Stand: Supervision         General transfer comment: supervision for safety and lines and cues for hand placement  Ambulation/Gait Ambulation/Gait assistance: Supervision Ambulation Distance (Feet): 500 Feet Assistive device: Rolling walker (2 wheeled)     Gait velocity interpretation: Below normal speed for age/gender General Gait Details: cues for position in RW, pt able to avoid all obstacles today and find her way to her room with min cues   Stairs            Wheelchair Mobility    Modified Rankin (Stroke Patients Only) Modified Rankin (Stroke Patients Only) Pre-Morbid Rankin Score: No symptoms Modified Rankin: Moderate disability     Balance Overall balance assessment: Needs assistance    Sitting balance-Leahy Scale: Good       Standing balance-Leahy Scale: Fair               High level balance activites: Backward walking;Turns High Level Balance Comments: pt performed repeated SLS bil with one hand held assist max 5 sec she was able to hold without increased assist for balance    Cognition Arousal/Alertness: Awake/alert Behavior During Therapy: WFL for tasks assessed/performed Overall Cognitive Status: Within Functional Limits for tasks assessed                      Exercises General Exercises - Lower Extremity Long Arc Quad: AROM;Seated;Both;20 reps Hip Flexion/Marching: AROM;Seated;Both;20 reps Toe Raises: AROM;Seated;Both;20 reps    General Comments        Pertinent Vitals/Pain Pain Assessment: No/denies pain  HR 77-84    Home Living                      Prior Function            PT Goals (current goals can now be found in the care plan section) Progress towards PT goals: Progressing toward goals    Frequency       PT Plan Current plan remains appropriate    Co-evaluation             End of Session   Activity Tolerance: Patient tolerated treatment well Patient left: in chair;with call bell/phone within reach;with chair alarm set     Time: 7425-9563 PT Time Calculation (min) (  ACUTE ONLY): 23 min  Charges:  $Gait Training: 8-22 mins $Therapeutic Activity: 8-22 mins                    G Codes:      Melford Aase 01-19-15, 9:31 AM Elwyn Reach, [place]

## 2014-12-27 NOTE — Progress Notes (Signed)
Speech Language Pathology Treatment: Cognitive-Linquistic  Patient Details Name: SEAIRRA OTANI MRN: [id number] DOB: October 16, 1924 Today's Date: 12/27/2014 Time: 8979-1504 SLP Time Calculation (min) (ACUTE ONLY): 21 min  Assessment / Plan / Recommendation Clinical Impression  Skilled treatment session focused on addressing cognitive goals; patient with noted improvements today.  Upon SLP arrival patient was setting up lunch tray; patient required increased time and assist with opening milk carton.  Patient was oriented to self, place and date and required Min verbal cues to problem solve use of clock for time.  Visual deficits were most limiting factor and impacted her independence with this task.  Patient intellectually and emergently aware of deficits; however, she continues to require Mod assist for anticipating impact of deficits on discharge and modifications that need to be made with home management task such as cooking.  Recommend initial 24/7 supervision at discharge with home health SLP follow up, faded to intermittent supervision once patient in familiar with home set-up and needed modifications for visual deficits.  SLP will also continue to follow acutely with updated goals.    HPI Other Pertinent Information: 81 y.o. female with a past medical history significant for hypertension, dyslipidemia, chronic back pain and history of skin cancer; who presented to the ED secondary to syncope episode. Chest x-ray with some opacities in her lower left lung, but unclear if infiltrates or not. CT angio acute infarct although evaluation for detection of small or medium size infarct is limited by the white matter changes.   Pertinent Vitals Pain Assessment: No/denies pain  SLP Plan  Goals updated    Recommendations                Follow up Recommendations: 24 hour supervision/assistance;Home health SLP;Other (comment) (faded to intermittent supervision once in familiar environment) Plan: Goals  updated    GO    Carmelia Roller., CCC-SLP [phone]  [place] 12/27/2014, 1:41 PM

## 2014-12-27 NOTE — Progress Notes (Signed)
[place] TEAM 1 - Stepdown/ICU TEAM PROGRESS NOTE  [name] NWG:[id number] DOB: 1924-09-28 DOA: 12/20/2014 PCP: [name], MD  Admit HPI / Brief Narrative: 81 y.o. female with HTN, HLD, and a hx of skin cancer who presented to [facility] ED August 26th, 2016 for evaluation of a syncopal episode that occurred the morning prior to the admission. She passed out and then was unable to get up and was only able to crawl towards the telephone to call her daughter. Daughter noted some blood in pt's underwear. Daughter reported pt had been taking Ibuprofen for pain over the last 2 weeks.  In ED pt was noted to be somnolent with initial BP 61/33, HR 130's, RR 36. Blood work notable for WBC 11 K, Hg 9.5, Cr 1.36.   HPI/Subjective: The pt states her vision has not changed, and that she continues to be unable to see objects in her lateral files bilaterally.  She is o/w w/o complaints, denying sob, cp, n/v, abdom pain, or other neurologic deficits.    Assessment/Plan:  New onset expressive aphasia and left facial droop 8/28 - resolved - Code Stroke called 8/28 - sx and distribution not completely c/w a CVA - CT head w/o acute findings - CTA head and neck negative for large vessel stenosis or occlusion (no MRI due to pacer) - would not be able to anticoag or use antiplt meds at this time given very large ulcer - clinically pt has completely recovered - Neuro has suggested resuming ASA 81QD when safe   ? Bitemporal hemianopsia - The history is quite difficult to obtain with great accuracy in this patient and her exam is somewhat variable but it does appear that she is suffering with loss of bilateral temporal visual fields  - this would suggest a lesion at the area of the optic chiasm such as a pituitary adenoma are even potential meningioma  - we cannot obtain an MRI given her pacemaker - she has had 2 CT head scans accomplished without evidence of a mass and one would expect a CT angiogram  to have revealed an aneurysm in this area  - prolactin level and TSH both normal - I have asked the Stroke Team to re-evaluate her - they have suggested a repeat CT head which I have ordered   Syncope - secondary to acute GI bleed - has not recurred   Acute GI bleed, acute blood loss anemia - large gastric ulcer  - secondary to large prepyloric gastric ulcer noted on EGD 12/20/2014 - high risk of re-bleeding - continue BID PPI therapy - Hgb stable  - had positive antibody for H. pylori but urease test was negative indicating no active infection - pt will need to see Dr. [name] in about 6 weeks to arrange repeat EGD to document healing of the large gastric ulcer  Acute thrombocytopenia - resolved   - from acute blood loss anemia / consumption   Acute kidney injury - secondary to acute bleeding - renal fxn has fluctuated but appears to be approaching normal at this time   Complete heart block s/p PPM 2009 - currently asymptomatic  Accelerated hypertension - resume home meds but hold on ARB until renal fxn normalized   E coli UTI  - to complete a 7 day course of abx today   Hyperglycemia - A1C 5.7, therefore not c/w DM   Code Status: FULL Family Communication: no family present at time of exam Disposition Plan: SDU   Consultants: [facility]  Neurology   Procedures: EGD done 12/20/2014 #1 giant prepyloric gastric ulcer with high risk of re-bleeding  #2 small clean-based antral and duodenal ulcersand erosions  #3 moderately large hiatal hernia  Antibiotics: Rocephin 8/27 >  DVT prophylaxis: SCDs  Objective: Blood pressure 167/54, pulse 74, temperature 98.4 F (36.9 C), temperature source Oral, resp. rate 28, height 5' 4.5" (1.638 m), weight 59.1 kg (130 lb 4.7 oz), SpO2 98 %.  Intake/Output Summary (Last 24 hours) at 12/27/14 0935 Last data filed at 12/27/14 0843  Gross per 24 hour  Intake    890 ml  Output    600 ml  Net    290 ml   Exam: General: alert and  conversant Lungs: Clear to auscultation bilaterally without wheezes or crackles Cardiovascular: Regular rate and rhythm without murmur gallop or rub Abdomen: Nontender, nondistended, soft, bowel sounds positive, no rebound, no ascites, no appreciable mass Extremities: No significant cyanosis, clubbing, edema bilateral lower extremities  Data Reviewed: Basic Metabolic Panel:  Recent Labs Lab 12/22/14 0655 12/23/14 0240 12/24/14 0303 12/25/14 0005 12/26/14 0531  NA 138 140 135 137 141  K 4.0 3.9 4.0 3.7 3.6  CL 106 110 105 108 111  CO2 24 26 20* 22 25  GLUCOSE 105* 116* 133* 139* 109*  BUN 41* 25* 42* 47* 35*  CREATININE 1.11* 0.99 1.90* 1.64* 1.10*  CALCIUM 9.4 9.3 9.2 9.1 9.0  MG  --   --   --  1.5*  --     CBC:  Recent Labs Lab 12/20/14 1036  12/22/14 0107  12/22/14 1808 12/23/14 0240 12/24/14 0303 12/26/14 0531 12/26/14 1535  WBC 13.3*  < > 10.5  --  9.1 7.5 14.0* 7.0  --   NEUTROABS 10.3*  --   --   --  7.0  --   --   --   --   HGB 10.8*  < > 9.3*  < > 9.9* 9.3* 10.8* 8.8* 9.7*  HCT 31.1*  < > 26.5*  < > 28.3* 27.0* 32.2* 25.9* 28.4*  MCV 86.6  < > 84.9  --  85.5 84.9 87.0 87.2  --   PLT 179  < > 151  --  162 168 219 183  --   < > = values in this interval not displayed.  Liver Function Tests:  Recent Labs Lab 12/20/14 1036 12/21/14 0533 12/24/14 0303  AST 18 17 19   ALT 14 12* 11*  ALKPHOS 30* 25* 34*  BILITOT 0.5 0.8 0.5  PROT 5.2* 4.6* 5.5*  ALBUMIN 2.8* 2.4* 2.9*   CBG:  Recent Labs Lab 12/20/14 1010  GLUCAP 106*    Recent Results (from the past 240 hour(s))  MRSA PCR Screening     Status: None   Collection Time: 12/20/14  3:22 PM  Result Value Ref Range Status   MRSA by PCR NEGATIVE NEGATIVE Final    Comment:        The GeneXpert MRSA Assay (FDA approved for NASAL specimens only), is one component of a comprehensive MRSA colonization surveillance program. It is not intended to diagnose MRSA infection nor to guide or monitor  treatment for MRSA infections.   Urine culture     Status: None   Collection Time: 12/21/14  9:48 PM  Result Value Ref Range Status   Specimen Description URINE, CLEAN CATCH  Final   Special Requests NONE  Final   Culture >=100,000 COLONIES/mL ESCHERICHIA COLI  Final   Report Status 12/23/2014 FINAL  Final  Organism ID, Bacteria ESCHERICHIA COLI  Final      Susceptibility   Escherichia coli - MIC*    AMPICILLIN >=32 RESISTANT Resistant     CEFAZOLIN <=4 SENSITIVE Sensitive     CEFTRIAXONE <=1 SENSITIVE Sensitive     CIPROFLOXACIN 1 SENSITIVE Sensitive     GENTAMICIN <=1 SENSITIVE Sensitive     IMIPENEM <=0.25 SENSITIVE Sensitive     NITROFURANTOIN <=16 SENSITIVE Sensitive     TRIMETH/SULFA >=320 RESISTANT Resistant     AMPICILLIN/SULBACTAM 16 INTERMEDIATE Intermediate     PIP/TAZO <=4 SENSITIVE Sensitive     * >=100,000 COLONIES/mL ESCHERICHIA COLI     Studies:   Recent x-ray studies have been reviewed in detail by the Attending Physician  Scheduled Meds:  Scheduled Meds: . amLODipine  5 mg Oral Daily  . cefTRIAXone (ROCEPHIN)  IV  1 g Intravenous Q24H  . cyanocobalamin  500 mcg Oral Daily  . hydrALAZINE  25 mg Oral 3 times per day  . Influenza vac split quadrivalent PF  0.5 mL Intramuscular Tomorrow-1000  . pantoprazole  40 mg Oral BID  . pneumococcal 23 valent vaccine  0.5 mL Intramuscular Tomorrow-1000    Time spent on care of this patient: 35 mins   MCCLUNG,JEFFREY T , MD   [facility] Office  [phone] Pager - Text Page per Shea Evans as per below:  On-Call/Text Page:      [email or web address]      password [email or web address]  If 7PM-7AM, please contact night-coverage [email or web address] Password [email or web address] 12/27/2014, 9:35 AM   LOS: 7 days

## 2014-12-27 NOTE — Progress Notes (Addendum)
STROKE TEAM PROGRESS NOTE   SUBJECTIVE (INTERVAL HISTORY) No family is at the bedside.  Neurology was called back by primary team regarding pt visual changes concerning for bitemporal vision field deficit. However, on exam showed right hemianopia. Pt stated that she was fine with vision 8/31 and yesterday 9/1 she noted vision changes with blurry vision. However, as per OT note on 8/29 pt was found to have right hemianopia which was not seen by neurology at that time. Of note, pt may not be a good historian due to mild degree of cognitive impairment. Will do CT and CTA head.    OBJECTIVE Temp:  [97.7 F (36.5 C)-98.9 F (37.2 C)] 98.4 F (36.9 C) (09/02 0742) Pulse Rate:  [74-84] 74 (09/02 0742) Cardiac Rhythm:  [-] A-V Sequential paced (09/02 0742) Resp:  [18-30] 28 (09/02 0742) BP: (128-167)/(41-112) 167/54 mmHg (09/02 0742) SpO2:  [95 %-98 %] 98 % (09/02 0742) Weight:  [130 lb 4.7 oz (59.1 kg)] 130 lb 4.7 oz (59.1 kg) (09/02 0430)  No results for input(s): GLUCAP in the last 168 hours.  Recent Labs Lab 12/22/14 0655 12/23/14 0240 12/24/14 0303 12/25/14 0005 12/26/14 0531  NA 138 140 135 137 141  K 4.0 3.9 4.0 3.7 3.6  CL 106 110 105 108 111  CO2 24 26 20* 22 25  GLUCOSE 105* 116* 133* 139* 109*  BUN 41* 25* 42* 47* 35*  CREATININE 1.11* 0.99 1.90* 1.64* 1.10*  CALCIUM 9.4 9.3 9.2 9.1 9.0  MG  --   --   --  1.5*  --     Recent Labs Lab 12/20/14 1036 12/21/14 0533 12/24/14 0303  AST 18 17 19   ALT 14 12* 11*  ALKPHOS 30* 25* 34*  BILITOT 0.5 0.8 0.5  PROT 5.2* 4.6* 5.5*  ALBUMIN 2.8* 2.4* 2.9*    Recent Labs Lab 12/20/14 1036  12/22/14 0107  12/22/14 1808 12/23/14 0240 12/24/14 0303 12/26/14 0531 12/26/14 1535  WBC 13.3*  < > 10.5  --  9.1 7.5 14.0* 7.0  --   NEUTROABS 10.3*  --   --   --  7.0  --   --   --   --   HGB 10.8*  < > 9.3*  < > 9.9* 9.3* 10.8* 8.8* 9.7*  HCT 31.1*  < > 26.5*  < > 28.3* 27.0* 32.2* 25.9* 28.4*  MCV 86.6  < > 84.9  --  85.5 84.9  87.0 87.2  --   PLT 179  < > 151  --  162 168 219 183  --   < > = values in this interval not displayed. No results for input(s): CKTOTAL, CKMB, CKMBINDEX, TROPONINI in the last 168 hours. No results for input(s): LABPROT, INR in the last 72 hours. No results for input(s): COLORURINE, LABSPEC, [place], GLUCOSEU, HGBUR, BILIRUBINUR, KETONESUR, PROTEINUR, UROBILINOGEN, NITRITE, LEUKOCYTESUR in the last 72 hours.  Invalid input(s): APPERANCEUR     Component Value Date/Time   CHOL 104 12/21/2014 0232   TRIG 126 12/21/2014 0232   HDL 31* 12/21/2014 0232   CHOLHDL 3.4 12/21/2014 0232   VLDL 25 12/21/2014 0232   LDLCALC 48 12/21/2014 0232   Lab Results  Component Value Date   HGBA1C 5.7* 12/20/2014   No results found for: LABOPIA, COCAINSCRNUR, LABBENZ, AMPHETMU, THCU, LABBARB  No results for input(s): ETH in the last 168 hours.  I have personally reviewed the radiological images below and agree with the radiology interpretations. Red text is my reading.  Dg Chest 2 View  12/20/2014   IMPRESSION: Patchy left basilar opacity -favor atelectasis over airspace disease. Consider radiographic follow-up in 2-3 weeks.  T6 and L1 compression fractures of uncertain chronicity but new since 2008. Correlate clinically.   Ct Head Wo Contrast  12/22/2014  IMPRESSION: 1. No evidence of acute intracranial hemorrhage or acute stroke. 2. Atrophy and chronic small vessel ischemic changes.  CTA head and neck  12/23/14 - Prominent small vessel disease type changes. No CT evidence of large acute infarct although evaluation for detection of small or medium size infarct is limited by the white matter changes. Global atrophy without hydrocephalus. 60% diameter stenosis proximal left internal carotid artery. Minimal plaque proximal right internal carotid artery. Codominant vertebral arteries. Slight narrowing distal right vertebral artery. Anterior and posterior intracranial circulation without medium  or large size vessel significant stenosis or occlusion. Mild intracranial branch vessel irregularity. By my reading, left ICA no significant stenosis per NASCET criteria.   CTA head 12/27/14 -  1. Newly seen nonhemorrhagic left occipital pole infarct. 2. No acute arterial finding or change from 12/23/2014. No flow limiting stenosis or embolic source detected. 3. Extensive chronic small vessel disease.  2D Echocardiogram   - Left ventricle: The cavity size was normal. Wall thickness was normal. Systolic function was vigorous. The estimated ejection fraction was in the range of 65% to 70%. There is hypokinesis of the inferior myocardium. Doppler parameters are consistent with abnormal left ventricular relaxation (grade 1 diastolic dysfunction). - Right ventricle: Akinesis of the RV mid anterior free wall. Systolic pressure was increased. - Pulmonary arteries: Systolic pressure was mildly increased. PA peak pressure: 35 mm Hg (S). Impressions: - No cardiac source of emboli was indentified.   PHYSICAL EXAM  Temp:  [97.7 F (36.5 C)-98.9 F (37.2 C)] 98.4 F (36.9 C) (09/02 0742) Pulse Rate:  [74-84] 74 (09/02 0742) Resp:  [18-30] 28 (09/02 0742) BP: (128-167)/(41-112) 167/54 mmHg (09/02 0742) SpO2:  [95 %-98 %] 98 % (09/02 0742) Weight:  [130 lb 4.7 oz (59.1 kg)] 130 lb 4.7 oz (59.1 kg) (09/02 0430)  General - thin bulit, well developed, in no apparent distress.  Ophthalmologic - Not cooperative on exam.  Cardiovascular - Regular rate and rhythm, with pacer.  Mental Status -  Awake alert, orientated to self, place and people, but not to time.  Language including expression, naming, repetition was assessed and found intact, follows simple commands but not complex commands. Fund of Knowledge was assessed and was impaired, knowing current president but not previous.  Cranial Nerves II - XII - II - right hemianopia. III, IV, VI - Extraocular movements intact. V  - Facial sensation intact bilaterally. VII - Facial movement intact bilaterally. VIII - Hard of hearing & vestibular intact bilaterally. X - Palate elevates symmetrically. XI - Chin turning & shoulder shrug intact bilaterally. XII - Tongue protrusion intact.  Motor Strength - The patient's strength was equal in all extremities and pronator drift was absent.  Bulk was normal and fasciculations were absent.   Motor Tone - Muscle tone was assessed at the neck and appendages and was normal.  Reflexes - The patient's reflexes were symmetrical in all extremities and she had no pathological reflexes.  Sensory - Light touch, temperature/pinprick were assessed and were symmetrical.    Coordination - The patient had normal movements in the hands with no ataxia or dysmetria.  Tremor was absent.  Gait and Station - deferred due to safety concerns.   ASSESSMENT/PLAN Ms.  [name] is a 81 y.o. female with history of HTN, CAD, heart block s/p pacer, hx of GIB admitted for new GIB. She was admitted 02/2014 for duodenal ulcer. She still on ASA at home prior to this admission as per RN. She was found to have large gastric ulcer this time. However, developed "aphasia" and "left facial droop" but quickly resolved. CT negative as well as CTA head and neck. However, pt stated that she has vision changes from Thursday and was found to have right hemianopia. However, OT documented in note that pt has right hemianopia on Monday.   Stroke - left PCA infarct, etiology unclear, could be large vessel athero (HTN, CAD) vs. Embolic (risk factors in good control, advanced age, and heart block).    MRI Not performed due to pacer  CT found left PCA infarct likely occurred during the initial event as OT was able to document hemianopia on Monday.  CTA head and neck negative for large vessel stenosis or occlusion  2D Echo  EF 65-70%  LDL 48  HgbA1c 5.7  SCDs for VTE prophylaxis  DIET SOFT Room service  appropriate?: Yes; Fluid consistency:: Thin   aspirin 81 mg orally every day prior to admission, now on no antithrombotic due to GIB. Resume ASA 81mg  whenever GI feels appropriate. Do not think pt is a anticoagulation candidate now so hold off embolic work up at this time. If in the future, pt becomes candidate for anticoagulation, may follow up with pacemaker check up to rule out afib.   Ongoing aggressive stroke risk factor management  Pt can not drive due to visual deficit  Follow up with OT and ophthalmology as outpt after discharge.  GIB  Was admitted 02/2014 for duodenal ulcer  This admission for large gastric ulcer  ASA discontinued so far  Agree to hold off antiplatelet for now. Resume ASA 81mg  whenever GI feels appropriate.   Pt is not candidate for anticoagulation so no embolic work up needed  CBC monitoring  protonix bid  Hypertension  Home meds:   Amlodipine, lisinopril and losartan Currently on amlodipine  Stable  Patient counseled to be compliant with her blood pressure medications  Other Stroke Risk Factors  Advanced age  Coronary artery disease  Other Active Problems  anemia  Other Pertinent History  lethargy  Hospital day # 7  Neurology will sign off. Please call with questions. Pt will follow up with Dr. Erlinda Hong at [facility] in about 2 months. Thanks for the consult.  Rosalin Hawking, MD PhD Stroke Neurology 12/27/2014 10:15 AM    To contact Stroke Continuity provider, please refer to [email or web address] After hours, contact General Neurology

## 2014-12-28 LAB — CBC
HEMATOCRIT: 28 % — AB (ref 36.0–46.0)
HEMOGLOBIN: 9.4 g/dL — AB (ref 12.0–15.0)
MCH: 29.5 pg (ref 26.0–34.0)
MCHC: 33.6 g/dL (ref 30.0–36.0)
MCV: 87.8 fL (ref 78.0–100.0)
Platelets: 187 10*3/uL (ref 150–400)
RBC: 3.19 MIL/uL — AB (ref 3.87–5.11)
RDW: 15.9 % — ABNORMAL HIGH (ref 11.5–15.5)
WBC: 6.6 10*3/uL (ref 4.0–10.5)

## 2014-12-28 LAB — BASIC METABOLIC PANEL
ANION GAP: 6 (ref 5–15)
BUN: 27 mg/dL — ABNORMAL HIGH (ref 6–20)
CO2: 25 mmol/L (ref 22–32)
Calcium: 9.3 mg/dL (ref 8.9–10.3)
Chloride: 108 mmol/L (ref 101–111)
Creatinine, Ser: 0.93 mg/dL (ref 0.44–1.00)
GFR calc Af Amer: >60 mL/min (ref >60)
GFR, EST NON AFRICAN AMERICAN: 53 mL/min — AB (ref >60)
GLUCOSE: 107 mg/dL — AB (ref 65–99)
POTASSIUM: 3.8 mmol/L (ref 3.5–5.1)
Sodium: 139 mmol/L (ref 135–145)

## 2014-12-28 MED ORDER — ASPIRIN 81 MG PO CHEW: 81.0000 mg | CHEWABLE_TABLET | Freq: Every day | ORAL | Status: DC

## 2014-12-28 MED ORDER — PANTOPRAZOLE SODIUM 40 MG PO TBEC: 40.0000 mg | DELAYED_RELEASE_TABLET | Freq: Two times a day (BID) | ORAL | Status: DC

## 2014-12-28 NOTE — Discharge Instructions (Signed)
Peptic Ulcer A peptic ulcer is a sore in the lining of your esophagus (esophageal ulcer), stomach (gastric ulcer), or in the first part of your small intestine (duodenal ulcer). The ulcer causes erosion into the deeper tissue. CAUSES  Normally, the lining of the stomach and the small intestine protects itself from the acid that digests food. The protective lining can be damaged by:  An infection caused by a bacterium called Helicobacter pylori (H. pylori).  Regular use of nonsteroidal anti-inflammatory drugs (NSAIDs), such as ibuprofen or aspirin.  Smoking tobacco. Other risk factors include being older than 66, drinking alcohol excessively, and having a family history of ulcer disease.  SYMPTOMS   Burning pain or gnawing in the area between the chest and the belly button.  Heartburn.  Nausea and vomiting.  Bloating. The pain can be worse on an empty stomach and at night. If the ulcer results in bleeding, it can cause:  Black, tarry stools.  Vomiting of bright red blood.  Vomiting of coffee-ground-looking materials. DIAGNOSIS  A diagnosis is usually made based upon your history and an exam. Other tests and procedures may be performed to find the cause of the ulcer. Finding a cause will help determine the best treatment. Tests and procedures may include:  Blood tests, stool tests, or breath tests to check for the bacterium H. pylori.  An upper gastrointestinal (GI) series of the esophagus, stomach, and small intestine.  An endoscopy to examine the esophagus, stomach, and small intestine.  A biopsy. TREATMENT  Treatment may include:  Eliminating the cause of the ulcer, such as smoking, NSAIDs, or alcohol.  Medicines to reduce the amount of acid in your digestive tract.  Antibiotic medicines if the ulcer is caused by the H. pylori bacterium.  An upper endoscopy to treat a bleeding ulcer.  Surgery if the bleeding is severe or if the ulcer created a hole somewhere in the  digestive system. HOME CARE INSTRUCTIONS   Avoid tobacco, alcohol, and caffeine. Smoking can increase the acid in the stomach, and continued smoking will impair the healing of ulcers.  Avoid foods and drinks that seem to cause discomfort or aggravate your ulcer.  Only take medicines as directed by your caregiver. Do not substitute over-the-counter medicines for prescription medicines without talking to your caregiver.  Keep any follow-up appointments and tests as directed. SEEK MEDICAL CARE IF:   Your do not improve within 7 days of starting treatment.  You have ongoing indigestion or heartburn. SEEK IMMEDIATE MEDICAL CARE IF:   You have sudden, sharp, or persistent abdominal pain.  You have bloody or dark black, tarry stools.  You vomit blood or vomit that looks like coffee grounds.  You become light-headed, weak, or feel faint.  You become sweaty or clammy. MAKE SURE YOU:   Understand these instructions.  Will watch your condition.  Will get help right away if you are not doing well or get worse. Document Released: 04/09/2000 Document Revised: 08/27/2013 Document Reviewed: 11/10/2011 [facility] Patient Information 2015 [place], [place]. This information is not intended to replace advice given to you by your health care provider. Make sure you discuss any questions you have with your health care provider.  Ischemic Stroke  A stroke (cerebrovascular accident) is the sudden death of brain tissue. It is a medical emergency. A stroke can cause permanent loss of brain function. This can cause problems with different parts of your body. A transient ischemic attack (TIA) is different because it does not cause permanent damage. A  TIA is a short-lived problem of poor blood flow affecting a part of the brain. A TIA is also a serious problem because having a TIA greatly increases the chances of having a stroke. When symptoms first develop, you cannot know if the problem might be a stroke or  a TIA. CAUSES  A stroke is caused by a decrease of oxygen supply to an area of your brain. It is usually the result of a small blood clot or collection of cholesterol or fat (plaque) that blocks blood flow in the brain. A stroke can also be caused by blocked or damaged carotid arteries.  RISK FACTORS  High blood pressure (hypertension).  High cholesterol.  Diabetes mellitus.  Heart disease.  The buildup of plaque in the blood vessels (peripheral artery disease or atherosclerosis).  The buildup of plaque in the blood vessels providing blood and oxygen to the brain (carotid artery stenosis).  An abnormal heart rhythm (atrial fibrillation).  Obesity.  Smoking.  Taking oral contraceptives (especially in combination with smoking).  Physical inactivity.  A diet high in fats, salt (sodium), and calories.  Alcohol use.  Use of illegal drugs (especially cocaine and methamphetamine).  Being African American.  Being over the age of 61.  Family history of stroke.  Previous history of blood clots, stroke, TIA, or heart attack.  Sickle cell disease. SYMPTOMS  These symptoms usually develop suddenly, or may be newly present upon awakening from sleep:  Sudden weakness or numbness of the face, arm, or leg, especially on one side of the body.  Sudden trouble walking or difficulty moving arms or legs.  Sudden confusion.  Sudden personality changes.  Trouble speaking (aphasia) or understanding.  Difficulty swallowing.  Sudden trouble seeing in one or both eyes.  Double vision.  Dizziness.  Loss of balance or coordination.  Sudden severe headache with no known cause.  Trouble reading or writing. DIAGNOSIS  Your health care provider can often determine the presence or absence of a stroke based on your symptoms, history, and physical exam. Computed tomography (CT) of the brain is usually performed to confirm the stroke, determine causes, and determine stroke severity.  Other tests may be done to find the cause of the stroke. These tests may include:  Electrocardiography.  Continuous heart monitoring.  Echocardiography.  Carotid ultrasonography.  Magnetic resonance imaging (MRI).  A scan of the brain circulation.  Blood tests. PREVENTION  The risk of a stroke can be decreased by appropriately treating high blood pressure, high cholesterol, diabetes, heart disease, and obesity and by quitting smoking, limiting alcohol, and staying physically active. TREATMENT  Time is of the essence. It is important to seek treatment at the first sign of these symptoms because you may receive a medicine to dissolve the clot (thrombolytic) that cannot be given if too much time has passed since your symptoms began. Even if you do not know when your symptoms began, get treatment as soon as possible as there are other treatment options available including oxygen, intravenous (IV) fluids, and medicines to thin the blood (anticoagulants). Treatment of stroke depends on the duration, severity, and cause of your symptoms. Medicines and dietary changes may be used to address diabetes, high blood pressure, and other risk factors. Physical, speech, and occupational therapists will assess you and work with you to improve any functions impaired by the stroke. Measures will be taken to prevent short-term and long-term complications, including infection from breathing foreign material into the lungs (aspiration pneumonia), blood clots in the legs,  bedsores, and falls. Rarely, surgery may be needed to remove large blood clots or to open up blocked arteries. HOME CARE INSTRUCTIONS   Take medicines only as directed by your health care provider. Follow the directions carefully. Medicines may be used to control risk factors for a stroke. Be sure you understand all your medicine instructions.  You may be told to take a medicine to thin the blood, such as aspirin or the anticoagulant warfarin.  Warfarin needs to be taken exactly as instructed.  Too much and too little warfarin are both dangerous. Too much warfarin increases the risk of bleeding. Too little warfarin continues to allow the risk for blood clots. While taking warfarin, you will need to have regular blood tests to measure your blood clotting time. These blood tests usually include both the PT and INR tests. The PT and INR results allow your health care provider to adjust your dose of warfarin. The dose can change for many reasons. It is critically important that you take warfarin exactly as prescribed, and that you have your PT and INR levels drawn exactly as directed.  Many foods, especially foods high in vitamin K, can interfere with warfarin and affect the PT and INR results. Foods high in vitamin K include spinach, kale, broccoli, cabbage, collard and turnip greens, brussels sprouts, peas, cauliflower, seaweed, and parsley, as well as beef and pork liver, green tea, and soybean oil. You should eat a consistent amount of foods high in vitamin K. Avoid major changes in your diet, or notify your health care provider before changing your diet. Arrange a visit with a dietitian to answer your questions.  Many medicines can interfere with warfarin and affect the PT and INR results. You must tell your health care provider about any and all medicines you take. This includes all vitamins and supplements. Be especially cautious with aspirin and anti-inflammatory medicines. Do not take or discontinue any prescribed or over-the-counter medicine except on the advice of your health care provider or pharmacist.  Warfarin can have side effects, such as excessive bruising or bleeding. You will need to hold pressure over cuts for longer than usual. Your health care provider or pharmacist will discuss other potential side effects.  Avoid sports or activities that may cause injury or bleeding.  Be mindful when shaving, flossing your teeth, or  handling sharp objects.  Alcohol can change the body's ability to handle warfarin. It is best to avoid alcoholic drinks or consume only very small amounts while taking warfarin. Notify your health care provider if you change your alcohol intake.  Notify your dentist or other health care providers before procedures.  If swallow studies have determined that your swallowing reflex is present, you should eat healthy foods. Including 5 or more servings of fruits and vegetables a day may reduce the risk of stroke. Foods may need to be a certain consistency (soft or pureed), or small bites may need to be taken in order to avoid aspirating or choking. Certain dietary changes may be advised to address high blood pressure, high cholesterol, diabetes, or obesity.  Food choices that are low in sodium, saturated fat, trans fat, and cholesterol are recommended to manage high blood pressure.  Food choies that are high in fiber, and low in saturated fat, trans fat, and cholesterol may control cholesterol levels.  Controlling carbohydrates and sugar intake is recommended to manage diabetes.  Reducing calorie intake and making food choices that are low in sodium, saturated fat, trans fat, and cholesterol  are recommended to manage obesity.  Maintain a healthy weight.  Stay physically active. It is recommended that you get at least 30 minutes of activity on all or most days.  Do not use any tobacco products including cigarettes, chewing tobacco, or electronic cigarettes.  Limit alcohol use even if you are not taking warfarin. Moderate alcohol use is considered to be:  No more than 2 drinks each day for men.  No more than 1 drink each day for nonpregnant women.  Home safety. A safe home environment is important to reduce the risk of falls. Your health care provider may arrange for specialists to evaluate your home. Having grab bars in the bedroom and bathroom is often important. Your health care provider may  arrange for equipment to be used at home, such as raised toilets and a seat for the shower.  Physical, occupational, and speech therapy. Ongoing therapy may be needed to maximize your recovery after a stroke. If you have been advised to use a walker or a cane, use it at all times. Be sure to keep your therapy appointments.  Follow all instructions for follow-up with your health care provider. This is very important. This includes any referrals, physical therapy, rehabilitation, and lab tests. Proper follow-up can prevent another stroke from occurring. SEEK MEDICAL CARE IF:  You have personality changes.  You have difficulty swallowing.  You are seeing double.  You have dizziness.  You have a fever.  You have skin breakdown. SEEK IMMEDIATE MEDICAL CARE IF:  Any of these symptoms may represent a serious problem that is an emergency. Do not wait to see if the symptoms will go away. Get medical help right away. Call your local emergency services (911 in [place] Do not drive yourself to the hospital.  You have sudden weakness or numbness of the face, arm, or leg, especially on one side of the body.  You have sudden trouble walking or difficulty moving arms or legs.  You have sudden confusion.  You have trouble speaking (aphasia) or understanding.  You have sudden trouble seeing in one or both eyes.  You have a loss of balance or coordination.  You have a sudden, severe headache with no known cause.  You have new chest pain or an irregular heartbeat.  You have a partial or total loss of consciousness. Document Released: 04/12/2005 Document Revised: 08/27/2013 Document Reviewed: 11/21/2011 [facility] Patient Information 2015 [place], [place]. This information is not intended to replace advice given to you by your health care provider. Make sure you discuss any questions you have with your health care provider.

## 2014-12-28 NOTE — Progress Notes (Signed)
Discharged home by wheelchair, accompanied by daughter, discharge instructions and prescription given, belongings with pt.

## 2014-12-28 NOTE — Discharge Summary (Signed)
DISCHARGE SUMMARY  MELIANA CANNER  MR#: [id number]  DOB:March 29, 1925  Date of Admission: 12/20/2014 Date of Discharge: 12/28/2014  Attending Physician:Lavell Ridings T  Patient's WIO:XBDZH,GDJMEQ NEVILL, MD  Consults: [facility]   Disposition: D/C home w/ family   Follow-up Appts:     Follow-up Information    Follow up with [facility].   Why:  nse will call 24-48hrs to set up appt   Contact information:   [address] [place] [address] [phone]       Follow up with Xu,Jindong, MD. Schedule an appointment as soon as possible for a visit in 2 months.   Specialty:  Neurology   Why:  stroke clinic   Contact information:   [address] [place] [address] [phone]       Follow up with GATES,ROBERT NEVILL, MD In 1 week.   Specialty:  Internal Medicine   Contact information:   [address] [place] [address] [phone]       Follow up with EDWARDS JR,JAMES L, MD In 2 weeks.   Specialty:  Gastroenterology   Why:  Please call the office at [phone] to arrange for a follow up visit with the GI doctor   Contact information:   [address] [place] [address] [phone]      Tests Needing Follow-up: -Blood pressure should be reevaluated as her Maxide therapy has been held -CBC should be checked to screen for recurrent occult GI bleeding -The patient has been instructed to resume her prior aspirin therapy on 01/06/15 - if the patient has had recurrent bleeding she should be advised to not resume this medication -As noted by Neurology if the patient continues to prove to be stable from a bleeding standpoint evaluation for occult atrial fibrillation as a source of CVA could be considered  Discharge Diagnoses: New onset expressive aphasia and left facial droop 8/28 - resolved 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 female with HTN, HLD, and a hx of skin cancer who presented to [facility] ED August 26th, 2016 for evaluation of a syncopal episode that occurred the morning prior to the admission. She passed out and then was unable to get up and was only able to crawl towards the telephone to call her daughter. Daughter noted some blood in pt's underwear. Daughter reported pt had been taking Ibuprofen for pain over the last 2 weeks.  In ED pt was noted to be somnolent with initial BP 61/33, HR 130's, RR 36. Blood work notable for WBC 11 K, Hg 9.5, Cr 1.36.   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 - The history was quite difficult  to obtain with great accuracy in this patient and her exam was somewhat variable but it did appear that she is suffering with loss of either bilateral temporal visual fields, or R hemianopia as of at least 8/29, but perhaps earlier  - we could not obtain an MRI given her pacemaker - she had 2 prior CT head scans accomplished without acute findings   - prolactin level and TSH both normal - Stroke Team asked to re-evaluate and felt this was a R hemianopia  - repeat CT angio head 9/2 noted L PCA infarct / L occipital CVA, but was negative for large vessel stenosis or occlusion - as  per Neuro "Do not think pt is a anticoagulation candidate now so hold off embolic work up at this time. If in the future, pt becomes candidate for anticoagulation, may follow up with pacemaker check up to rule out afib.  Pt can not drive due to visual deficit - Follow up with OT and ophthalmology as outpt after discharge."  Syncope - secondary to acute GI bleed - has not recurred during admission   Acute GI bleed, acute blood loss anemia - large gastric ulcer  - secondary to large prepyloric gastric ulcer noted on EGD 12/20/2014 - high risk of re-bleeding - hold aspirin until 01/06/2015 - continue BID PPI therapy - Hgb stable  - had positive antibody for H. pylori but urease test was negative indicating no active infection - pt will need to see Dr. Oletta Lamas to arrange repeat EGD to document healing of the large gastric ulcer prior to consideration of atrial fibrillation workup/further anticoagulation beyond low-dose aspirin  Acute thrombocytopenia - resolved  - from acute blood loss anemia / consumption   Acute kidney injury - secondary to acute bleeding - renal fxn has fluctuated but appears to be approaching normal at this time   Complete heart block s/p PPM 2009 - currently asymptomatic  Accelerated hypertension - resume home meds but hold on ARB until renal fxn normalized   E coli UTI  - completed a 7 day course of abx   Hyperglycemia - A1C 5.7, therefore not c/w DM     Medication List    STOP taking these medications        lisinopril 10 MG tablet  Commonly known as:  PRINIVIL,ZESTRIL     triamterene-hydrochlorothiazide 37.5-25 MG per tablet  Commonly known as:  MAXZIDE-25      TAKE these medications        amLODipine 10 MG tablet  Commonly known as:  NORVASC  Take 10 mg by mouth daily.     aspirin 81 MG chewable tablet  Chew 1 tablet (81 mg total) by mouth daily.  Start taking on:  01/06/2015     CALCIUM 600+D3 600-800 MG-UNIT Tabs  Generic drug:  Calcium  Carb-Cholecalciferol  Take 1 tablet by mouth daily.     cholecalciferol 400 UNITS Tabs tablet  Commonly known as:  VITAMIN D  Take 400 Units by mouth daily.     Fish Oil 1200 MG Caps  Take 1,200 mg by mouth daily.     losartan 25 MG tablet  Commonly known as:  COZAAR  Take 25 mg by mouth daily.     pantoprazole 40 MG tablet  Commonly known as:  PROTONIX  Take 1 tablet (40 mg total) by mouth 2 (two) times daily.     traMADol 50 MG tablet  Commonly known as:  ULTRAM  Take 50 mg by mouth every 12 (  twelve) hours.     vitamin B-12 500 MCG tablet  Commonly known as:  CYANOCOBALAMIN  Take 500 mcg by mouth daily.       Day of Discharge BP 188/69 mmHg  Pulse 87  Temp(Src) 97.8 F (36.6 C) (Oral)  Resp 18  Ht 5' 4.5" (1.638 m)  Wt 56 kg (123 lb 7.3 oz)  BMI 20.87 kg/m2  SpO2 98%  Physical Exam: General: No acute respiratory distress Lungs: Clear to auscultation bilaterally without wheezes or crackles Cardiovascular: Regular rate and rhythm without murmur gallop or rub normal S1 and S2 Abdomen: Nontender, nondistended, soft, bowel sounds positive, no rebound, no ascites, no appreciable mass Extremities: No significant cyanosis, clubbing, or edema bilateral lower extremities  Basic Metabolic Panel:  Recent Labs Lab 12/23/14 0240 12/24/14 0303 12/25/14 0005 12/26/14 0531 12/28/14 0250  NA 140 135 137 141 139  K 3.9 4.0 3.7 3.6 3.8  CL 110 105 108 111 108  CO2 26 20* 22 25 25   GLUCOSE 116* 133* 139* 109* 107*  BUN 25* 42* 47* 35* 27*  CREATININE 0.99 1.90* 1.64* 1.10* 0.93  CALCIUM 9.3 9.2 9.1 9.0 9.3  MG  --   --  1.5*  --   --     Liver Function Tests:  Recent Labs Lab 12/24/14 0303  AST 19  ALT 11*  ALKPHOS 34*  BILITOT 0.5  PROT 5.5*  ALBUMIN 2.9*   CBC:  Recent Labs Lab 12/22/14 1808 12/23/14 0240 12/24/14 0303 12/26/14 0531 12/26/14 1535 12/28/14 0250  WBC 9.1 7.5 14.0* 7.0  --  6.6  NEUTROABS 7.0  --   --   --   --   --   HGB 9.9*  9.3* 10.8* 8.8* 9.7* 9.4*  HCT 28.3* 27.0* 32.2* 25.9* 28.4* 28.0*  MCV 85.5 84.9 87.0 87.2  --  87.8  PLT 162 168 219 183  --  187    Recent Results (from the past 240 hour(s))  MRSA PCR Screening     Status: None   Collection Time: 12/20/14  3:22 PM  Result Value Ref Range Status   MRSA by PCR NEGATIVE NEGATIVE Final    Comment:        The GeneXpert MRSA Assay (FDA approved for NASAL specimens only), is one component of a comprehensive MRSA colonization surveillance program. It is not intended to diagnose MRSA infection nor to guide or monitor treatment for MRSA infections.   Urine culture     Status: None   Collection Time: 12/21/14  9:48 PM  Result Value Ref Range Status   Specimen Description URINE, CLEAN CATCH  Final   Special Requests NONE  Final   Culture >=100,000 COLONIES/mL ESCHERICHIA COLI  Final   Report Status 12/23/2014 FINAL  Final   Organism ID, Bacteria ESCHERICHIA COLI  Final      Susceptibility   Escherichia coli - MIC*    AMPICILLIN >=32 RESISTANT Resistant     CEFAZOLIN <=4 SENSITIVE Sensitive     CEFTRIAXONE <=1 SENSITIVE Sensitive     CIPROFLOXACIN 1 SENSITIVE Sensitive     GENTAMICIN <=1 SENSITIVE Sensitive     IMIPENEM <=0.25 SENSITIVE Sensitive     NITROFURANTOIN <=16 SENSITIVE Sensitive     TRIMETH/SULFA >=320 RESISTANT Resistant     AMPICILLIN/SULBACTAM 16 INTERMEDIATE Intermediate     PIP/TAZO <=4 SENSITIVE Sensitive     * >=100,000 COLONIES/mL ESCHERICHIA COLI     Time spent in discharge (includes decision making & examination of pt): >35  minutes  12/28/2014, 10:01 AM   Cherene Altes, MD [facility] Office  [phone] Pager [phone]  On-Call/Text Page:      [email or web address]      password [facility]

## 2014-12-29 NOTE — Progress Notes (Signed)
[facility] aware of dc home with HH.   Please see previous CM notes.   Jonnie Finner RN CCM Case Mgmt phone [phone]

## 2015-01-01 DIAGNOSIS — I69398 Other sequelae of cerebral infarction: Secondary | ICD-10-CM | POA: Diagnosis not present

## 2015-01-01 DIAGNOSIS — R55 Syncope and collapse: Secondary | ICD-10-CM | POA: Diagnosis not present

## 2015-01-01 DIAGNOSIS — Z950 Presence of cardiac pacemaker: Secondary | ICD-10-CM | POA: Diagnosis not present

## 2015-01-01 DIAGNOSIS — H5347 Heteronymous bilateral field defects: Secondary | ICD-10-CM | POA: Diagnosis not present

## 2015-01-01 DIAGNOSIS — I10 Essential (primary) hypertension: Secondary | ICD-10-CM | POA: Diagnosis not present

## 2015-01-01 DIAGNOSIS — E785 Hyperlipidemia, unspecified: Secondary | ICD-10-CM | POA: Diagnosis not present

## 2015-01-01 DIAGNOSIS — G8929 Other chronic pain: Secondary | ICD-10-CM | POA: Diagnosis not present

## 2015-01-01 DIAGNOSIS — F329 Major depressive disorder, single episode, unspecified: Secondary | ICD-10-CM | POA: Diagnosis not present

## 2015-01-01 DIAGNOSIS — D696 Thrombocytopenia, unspecified: Secondary | ICD-10-CM | POA: Diagnosis not present

## 2015-01-01 DIAGNOSIS — Z85828 Personal history of other malignant neoplasm of skin: Secondary | ICD-10-CM | POA: Diagnosis not present

## 2015-01-01 DIAGNOSIS — M545 Low back pain: Secondary | ICD-10-CM | POA: Diagnosis not present

## 2015-01-01 DIAGNOSIS — Z87440 Personal history of urinary (tract) infections: Secondary | ICD-10-CM | POA: Diagnosis not present

## 2015-01-01 DIAGNOSIS — F419 Anxiety disorder, unspecified: Secondary | ICD-10-CM | POA: Diagnosis not present

## 2015-01-02 DIAGNOSIS — G8929 Other chronic pain: Secondary | ICD-10-CM | POA: Diagnosis not present

## 2015-01-02 DIAGNOSIS — M545 Low back pain: Secondary | ICD-10-CM | POA: Diagnosis not present

## 2015-01-02 DIAGNOSIS — R55 Syncope and collapse: Secondary | ICD-10-CM | POA: Diagnosis not present

## 2015-01-02 DIAGNOSIS — I69398 Other sequelae of cerebral infarction: Secondary | ICD-10-CM | POA: Diagnosis not present

## 2015-01-02 DIAGNOSIS — H5347 Heteronymous bilateral field defects: Secondary | ICD-10-CM | POA: Diagnosis not present

## 2015-01-02 DIAGNOSIS — D696 Thrombocytopenia, unspecified: Secondary | ICD-10-CM | POA: Diagnosis not present

## 2015-01-06 DIAGNOSIS — G8929 Other chronic pain: Secondary | ICD-10-CM | POA: Diagnosis not present

## 2015-01-06 DIAGNOSIS — D696 Thrombocytopenia, unspecified: Secondary | ICD-10-CM | POA: Diagnosis not present

## 2015-01-06 DIAGNOSIS — M545 Low back pain: Secondary | ICD-10-CM | POA: Diagnosis not present

## 2015-01-06 DIAGNOSIS — I69398 Other sequelae of cerebral infarction: Secondary | ICD-10-CM | POA: Diagnosis not present

## 2015-01-06 DIAGNOSIS — H5347 Heteronymous bilateral field defects: Secondary | ICD-10-CM | POA: Diagnosis not present

## 2015-01-06 DIAGNOSIS — R55 Syncope and collapse: Secondary | ICD-10-CM | POA: Diagnosis not present

## 2015-01-09 DIAGNOSIS — H5347 Heteronymous bilateral field defects: Secondary | ICD-10-CM | POA: Diagnosis not present

## 2015-01-09 DIAGNOSIS — D696 Thrombocytopenia, unspecified: Secondary | ICD-10-CM | POA: Diagnosis not present

## 2015-01-09 DIAGNOSIS — G8929 Other chronic pain: Secondary | ICD-10-CM | POA: Diagnosis not present

## 2015-01-09 DIAGNOSIS — I69398 Other sequelae of cerebral infarction: Secondary | ICD-10-CM | POA: Diagnosis not present

## 2015-01-09 DIAGNOSIS — M545 Low back pain: Secondary | ICD-10-CM | POA: Diagnosis not present

## 2015-01-09 DIAGNOSIS — R55 Syncope and collapse: Secondary | ICD-10-CM | POA: Diagnosis not present

## 2015-01-13 ENCOUNTER — Ambulatory Visit (INDEPENDENT_AMBULATORY_CARE_PROVIDER_SITE_OTHER): Payer: Medicare Other | Admitting: *Deleted

## 2015-01-13 ENCOUNTER — Telehealth: Payer: Self-pay | Admitting: Cardiology

## 2015-01-13 DIAGNOSIS — I442 Atrioventricular block, complete: Secondary | ICD-10-CM | POA: Diagnosis not present

## 2015-01-13 NOTE — Telephone Encounter (Signed)
Confirmed remote transmission w/ pt daughter.   

## 2015-01-14 NOTE — Progress Notes (Signed)
Remote pacemaker transmission.   

## 2015-01-15 DIAGNOSIS — H53461 Homonymous bilateral field defects, right side: Secondary | ICD-10-CM | POA: Diagnosis not present

## 2015-01-15 DIAGNOSIS — M545 Low back pain: Secondary | ICD-10-CM | POA: Diagnosis not present

## 2015-01-15 DIAGNOSIS — R55 Syncope and collapse: Secondary | ICD-10-CM | POA: Diagnosis not present

## 2015-01-15 DIAGNOSIS — D696 Thrombocytopenia, unspecified: Secondary | ICD-10-CM | POA: Diagnosis not present

## 2015-01-15 DIAGNOSIS — G8929 Other chronic pain: Secondary | ICD-10-CM | POA: Diagnosis not present

## 2015-01-15 DIAGNOSIS — H5347 Heteronymous bilateral field defects: Secondary | ICD-10-CM | POA: Diagnosis not present

## 2015-01-15 DIAGNOSIS — I69398 Other sequelae of cerebral infarction: Secondary | ICD-10-CM | POA: Diagnosis not present

## 2015-01-16 DIAGNOSIS — G8929 Other chronic pain: Secondary | ICD-10-CM | POA: Diagnosis not present

## 2015-01-16 DIAGNOSIS — I69398 Other sequelae of cerebral infarction: Secondary | ICD-10-CM | POA: Diagnosis not present

## 2015-01-16 DIAGNOSIS — H5347 Heteronymous bilateral field defects: Secondary | ICD-10-CM | POA: Diagnosis not present

## 2015-01-16 DIAGNOSIS — K259 Gastric ulcer, unspecified as acute or chronic, without hemorrhage or perforation: Secondary | ICD-10-CM | POA: Diagnosis not present

## 2015-01-16 DIAGNOSIS — I6930 Unspecified sequelae of cerebral infarction: Secondary | ICD-10-CM | POA: Diagnosis not present

## 2015-01-16 DIAGNOSIS — K922 Gastrointestinal hemorrhage, unspecified: Secondary | ICD-10-CM | POA: Diagnosis not present

## 2015-01-16 DIAGNOSIS — D696 Thrombocytopenia, unspecified: Secondary | ICD-10-CM | POA: Diagnosis not present

## 2015-01-16 DIAGNOSIS — M545 Low back pain: Secondary | ICD-10-CM | POA: Diagnosis not present

## 2015-01-16 DIAGNOSIS — R55 Syncope and collapse: Secondary | ICD-10-CM | POA: Diagnosis not present

## 2015-01-16 DIAGNOSIS — I10 Essential (primary) hypertension: Secondary | ICD-10-CM | POA: Diagnosis not present

## 2015-01-17 DIAGNOSIS — H5347 Heteronymous bilateral field defects: Secondary | ICD-10-CM | POA: Diagnosis not present

## 2015-01-17 DIAGNOSIS — G8929 Other chronic pain: Secondary | ICD-10-CM | POA: Diagnosis not present

## 2015-01-17 DIAGNOSIS — I69398 Other sequelae of cerebral infarction: Secondary | ICD-10-CM | POA: Diagnosis not present

## 2015-01-17 DIAGNOSIS — D696 Thrombocytopenia, unspecified: Secondary | ICD-10-CM | POA: Diagnosis not present

## 2015-01-17 DIAGNOSIS — M545 Low back pain: Secondary | ICD-10-CM | POA: Diagnosis not present

## 2015-01-17 DIAGNOSIS — R55 Syncope and collapse: Secondary | ICD-10-CM | POA: Diagnosis not present

## 2015-01-20 DIAGNOSIS — R55 Syncope and collapse: Secondary | ICD-10-CM | POA: Diagnosis not present

## 2015-01-20 DIAGNOSIS — D696 Thrombocytopenia, unspecified: Secondary | ICD-10-CM | POA: Diagnosis not present

## 2015-01-20 DIAGNOSIS — M545 Low back pain: Secondary | ICD-10-CM | POA: Diagnosis not present

## 2015-01-20 DIAGNOSIS — I69398 Other sequelae of cerebral infarction: Secondary | ICD-10-CM | POA: Diagnosis not present

## 2015-01-20 DIAGNOSIS — G8929 Other chronic pain: Secondary | ICD-10-CM | POA: Diagnosis not present

## 2015-01-20 DIAGNOSIS — H5347 Heteronymous bilateral field defects: Secondary | ICD-10-CM | POA: Diagnosis not present

## 2015-01-21 NOTE — Addendum Note (Signed)
Addended by: Tiajuana Amass on: 01/21/2015 08:50 AM   Modules accepted: Level of Service

## 2015-01-22 DIAGNOSIS — D696 Thrombocytopenia, unspecified: Secondary | ICD-10-CM | POA: Diagnosis not present

## 2015-01-22 DIAGNOSIS — G8929 Other chronic pain: Secondary | ICD-10-CM | POA: Diagnosis not present

## 2015-01-22 DIAGNOSIS — R55 Syncope and collapse: Secondary | ICD-10-CM | POA: Diagnosis not present

## 2015-01-22 DIAGNOSIS — M545 Low back pain: Secondary | ICD-10-CM | POA: Diagnosis not present

## 2015-01-22 DIAGNOSIS — H5347 Heteronymous bilateral field defects: Secondary | ICD-10-CM | POA: Diagnosis not present

## 2015-01-22 DIAGNOSIS — I69398 Other sequelae of cerebral infarction: Secondary | ICD-10-CM | POA: Diagnosis not present

## 2015-01-23 ENCOUNTER — Other Ambulatory Visit: Payer: Self-pay | Admitting: Interventional Cardiology

## 2015-02-06 DIAGNOSIS — H5347 Heteronymous bilateral field defects: Secondary | ICD-10-CM | POA: Diagnosis not present

## 2015-02-06 DIAGNOSIS — I69398 Other sequelae of cerebral infarction: Secondary | ICD-10-CM | POA: Diagnosis not present

## 2015-02-06 DIAGNOSIS — M545 Low back pain: Secondary | ICD-10-CM | POA: Diagnosis not present

## 2015-02-06 DIAGNOSIS — R55 Syncope and collapse: Secondary | ICD-10-CM | POA: Diagnosis not present

## 2015-02-06 DIAGNOSIS — G8929 Other chronic pain: Secondary | ICD-10-CM | POA: Diagnosis not present

## 2015-02-06 DIAGNOSIS — D696 Thrombocytopenia, unspecified: Secondary | ICD-10-CM | POA: Diagnosis not present

## 2015-02-07 DIAGNOSIS — K253 Acute gastric ulcer without hemorrhage or perforation: Secondary | ICD-10-CM | POA: Diagnosis not present

## 2015-02-07 DIAGNOSIS — K259 Gastric ulcer, unspecified as acute or chronic, without hemorrhage or perforation: Secondary | ICD-10-CM | POA: Diagnosis not present

## 2015-02-11 ENCOUNTER — Encounter: Payer: Self-pay | Admitting: Neurology

## 2015-02-11 ENCOUNTER — Ambulatory Visit (INDEPENDENT_AMBULATORY_CARE_PROVIDER_SITE_OTHER): Payer: Medicare Other | Admitting: Neurology

## 2015-02-11 VITALS — BP 141/69 | HR 65 | Ht 64.0 in | Wt 127.6 lb

## 2015-02-11 DIAGNOSIS — I4430 Unspecified atrioventricular block: Secondary | ICD-10-CM | POA: Diagnosis not present

## 2015-02-11 DIAGNOSIS — K254 Chronic or unspecified gastric ulcer with hemorrhage: Secondary | ICD-10-CM

## 2015-02-11 DIAGNOSIS — I63432 Cerebral infarction due to embolism of left posterior cerebral artery: Secondary | ICD-10-CM | POA: Diagnosis not present

## 2015-02-11 DIAGNOSIS — Z950 Presence of cardiac pacemaker: Secondary | ICD-10-CM | POA: Diagnosis not present

## 2015-02-11 DIAGNOSIS — I639 Cerebral infarction, unspecified: Secondary | ICD-10-CM

## 2015-02-11 DIAGNOSIS — I10 Essential (primary) hypertension: Secondary | ICD-10-CM

## 2015-02-11 NOTE — Progress Notes (Signed)
STROKE NEUROLOGY FOLLOW UP NOTE  NAME: [name] DOB: 18-Apr-1925  REASON FOR VISIT: stroke follow up HISTORY FROM: daughter and chart  Today we had the pleasure of seeing [name] in follow-up at our Neurology Clinic. Pt was accompanied by daughter.   History Summary [name] is a 81 y.o. female with history of HTN, CAD, heart block s/p pacer, hx of GIB admitted on 12/20/14 for new GIB. She was admitted 02/2014 for duodenal ulcer. She still on ASA at home prior to this admission as per RN. She was found to have large gastric ulcer this time. However, developed "aphasia" and "left facial droop" but quickly resolved. CT negative as well as CTA head and neck. However, pt stated that she has vision changes even sicne and was found to have right hemianopia. CT repeat showed left PCA infarct. Other stroke work up all negative including 2D echo, LDL and A1C. Her stroke was suspicious for embolic pattern. However, as she is not anticoagulation candidate, no embolic work up was performed.    Interval History During the interval time, the patient has been doing well. Right hemianopia improved and followed up with ophthal showed only right lower quadrantanopsia. She follows with GI and will do EGD again on 02/14/15. Currently still not cleared for antiplatelet yet. Pt denies any heart palpitatoin. BP 141/69 today.   REVIEW OF SYSTEMS: Full 14 system review of systems performed and notable only for those listed below and in HPI above, all others are negative:  Constitutional:   Cardiovascular:  Ear/Nose/Throat:   Skin:  Eyes:  Loss of vision, blurry vision,  Respiratory:   Gastroitestinal:   Genitourinary:  Hematology/Lymphatic:  Bruise / bleed easily Endocrine:  Musculoskeletal:  Back pain Allergy/Immunology:   Neurological:   Psychiatric:  Sleep:   The following represents the patient's updated allergies and side effects list: Allergies  Allergen  Reactions  . Clindamycin/Lincomycin Rash  . Antihistamines, Chlorpheniramine-Type     nightmares  . Ativan [Lorazepam]     Possible: agitation  . Imiquimod   . Micardis Hct [Telmisartan-Hctz]     fatigue  . Thiazide-Type Diuretics     Possible photosensitivity  . Ultracet [Tramadol-Acetaminophen]     nightmares    The neurologically relevant items on the patient's problem list were reviewed on today's visit.  Neurologic Examination  A problem focused neurological exam (12 or more points of the single system neurologic examination, vital signs counts as 1 point, cranial nerves count for 8 points) was performed.  Blood pressure 141/69, pulse 65, height 5\' 4"  (1.626 m), weight 127 lb 9.6 oz (57.879 kg).  General - Well nourished, well developed, in no apparent distress.  Ophthalmologic - Fundi not visualized due to eye movement.  Cardiovascular - Regular rate and rhythm.  Mental Status -  Level of arousal and orientation to time, place, and person were intact. Language including expression, naming, repetition, comprehension was assessed and found intact. Fund of Knowledge was assessed and was intact.  Cranial Nerves II - XII - II - Visual field exam showed right lower quandratanopia. III, IV, VI - Extraocular movements intact. V - Facial sensation intact bilaterally. VII - Facial movement intact bilaterally. VIII - Hearing & vestibular intact bilaterally. X - Palate elevates symmetrically. XI - Chin turning & shoulder shrug intact bilaterally. XII - Tongue protrusion intact.  Motor Strength - The patient's strength was normal in all extremities and pronator drift was absent.  Bulk was normal and  fasciculations were absent.   Motor Tone - Muscle tone was assessed at the neck and appendages and was normal.  Reflexes - The patient's reflexes were 1+ in all extremities and she had no pathological reflexes.  Sensory - Light touch, temperature/pinprick were assessed and were  normal.    Coordination - The patient had normal movements in the hands with no ataxia or dysmetria.  Tremor was absent.  Gait and Station - difficulty getting up from chair, slow, and small stride with mild stooped posturing.  Data reviewed: I personally reviewed the images and agree with the radiology interpretations.  Dg Chest 2 View  12/20/2014 IMPRESSION: Patchy left basilar opacity -favor atelectasis over airspace disease. Consider radiographic follow-up in 2-3 weeks. T6 and L1 compression fractures of uncertain chronicity but new since 2008. Correlate clinically.   Ct Head Wo Contrast  12/22/2014 IMPRESSION: 1. No evidence of acute intracranial hemorrhage or acute stroke. 2. Atrophy and chronic small vessel ischemic changes.  CTA head and neck 12/23/14 - Prominent small vessel disease type changes. No CT evidence of large acute infarct although evaluation for detection of small or medium size infarct is limited by the white matter changes. Global atrophy without hydrocephalus. 60% diameter stenosis proximal left internal carotid artery. Minimal plaque proximal right internal carotid artery. Codominant vertebral arteries. Slight narrowing distal right vertebral artery. Anterior and posterior intracranial circulation without medium or large size vessel significant stenosis or occlusion. Mild intracranial branch vessel irregularity. By my reading, left ICA no significant stenosis per NASCET criteria.   CTA head 12/27/14 -  1. Newly seen nonhemorrhagic left occipital pole infarct. 2. No acute arterial finding or change from 12/23/2014. No flow limiting stenosis or embolic source detected. 3. Extensive chronic small vessel disease.  2D Echocardiogram  - Left ventricle: The cavity size was normal. Wall thickness was normal. Systolic function was vigorous. The estimated ejection fraction was in the range of 65% to 70%. There is hypokinesis of the inferior myocardium.  Doppler parameters are consistent with abnormal left ventricular relaxation (grade 1 diastolic dysfunction). - Right ventricle: Akinesis of the RV mid anterior free wall. Systolic pressure was increased. - Pulmonary arteries: Systolic pressure was mildly increased. PA peak pressure: 35 mm Hg (S). Impressions: - No cardiac source of emboli was indentified.  Component     Latest Ref Rng 12/20/2014 12/21/2014  Cholesterol     0 - 200 mg/dL  104  Triglycerides     <150 mg/dL  126  HDL Cholesterol     >40 mg/dL  31 (L)  Total CHOL/HDL Ratio       3.4  VLDL     0 - 40 mg/dL  25  LDL (calc)     0 - 99 mg/dL  48  Hemoglobin A1C     4.8 - 5.6 % 5.7 (H)   Mean Plasma Glucose      117   Vitamin B-12     180 - 914 pg/mL 1814 (H)   TSH     0.350 - 4.500 uIU/mL 1.037     Assessment: As you may recall, she is a 81 y.o. Caucasian female with PMH of history of HTN, CAD, heart block s/p pacer, hx of GIB admitted on 12/20/14 for new GIB due to large gastric ulcer. However, developed "aphasia" and "left facial droop" but quickly resolved. CT negative as well as CTA head and neck. However, she was found to have right hemianopia. CT repeat showed left PCA infarct. Other stroke  work up all negative including 2D echo, LDL and A1C. Her stroke was suspicious for embolic pattern. However, as she is not anticoagulation candidate, no embolic work up was performed. During the interval time, right hemianopia improved to only right lower quadrantanopsia. She follows with GI and will do EGD again on 02/14/15. Currently still not cleared for antiplatelet yet.  Plan:  - continue follow up with GI and determine the timing to be a candidate for antiplatelet or anticoagulation therap - once becomes candidate for [facility], will need pacemaker interrogation to rule out afib. - follow up with ophthalmology for the visual field testing and monitoring. May consider driving evaluation depends on the recovery of visual  field. - check BP at home - Follow up with your primary care physician for stroke risk factor modification. Recommend maintain blood pressure goal <130/80, diabetes with hemoglobin A1c goal below 6.5% and lipids with LDL cholesterol goal below 70 mg/dL.  - follow up in 3 months  I spent more than 25 minutes of face to face time with the patient. Greater than 50% of time was spent in counseling and coordination of care. We have discussed about GI evaluation and future stroke work up and options of antiplatelet and anticoagulation at that time.   No orders of the defined types were placed in this encounter.    Meds ordered this encounter  Medications  . triamterene-hydrochlorothiazide (MAXZIDE-25) 37.5-25 MG tablet    Sig:   . omeprazole (PRILOSEC) 40 MG capsule    Sig:   . amLODipine (NORVASC) 5 MG tablet    Sig: Take 5 mg by mouth daily.    Patient Instructions  - continue follow up with GI doctor for the ulcer management and determine the timing to be a candidate for antiplatelet or anticoagulation therapy - follow up with eye doctor for the visual field testing and monitoring. May consider driving evaluation depends on the recovery of visual field. - check BP at home - Follow up with your primary care physician for stroke risk factor modification. Recommend maintain blood pressure goal <130/80, diabetes with hemoglobin A1c goal below 6.5% and lipids with LDL cholesterol goal below 70 mg/dL.  - follow up in 3 months   Rosalin Hawking, MD PhD [facility] [address], [place], [place] [address] [phone]

## 2015-02-11 NOTE — Patient Instructions (Signed)
-   continue follow up with GI doctor for the ulcer management and determine the timing to be a candidate for antiplatelet or anticoagulation therapy - follow up with eye doctor for the visual field testing and monitoring. May consider driving evaluation depends on the recovery of visual field. - check BP at home - Follow up with your primary care physician for stroke risk factor modification. Recommend maintain blood pressure goal <130/80, diabetes with hemoglobin A1c goal below 6.5% and lipids with LDL cholesterol goal below 70 mg/dL.  - follow up in 3 months

## 2015-02-25 DIAGNOSIS — M545 Low back pain: Secondary | ICD-10-CM | POA: Diagnosis not present

## 2015-02-25 DIAGNOSIS — I69398 Other sequelae of cerebral infarction: Secondary | ICD-10-CM | POA: Diagnosis not present

## 2015-02-25 DIAGNOSIS — D696 Thrombocytopenia, unspecified: Secondary | ICD-10-CM | POA: Diagnosis not present

## 2015-02-25 DIAGNOSIS — R55 Syncope and collapse: Secondary | ICD-10-CM | POA: Diagnosis not present

## 2015-02-25 DIAGNOSIS — H5347 Heteronymous bilateral field defects: Secondary | ICD-10-CM | POA: Diagnosis not present

## 2015-02-25 DIAGNOSIS — G8929 Other chronic pain: Secondary | ICD-10-CM | POA: Diagnosis not present

## 2015-02-27 DIAGNOSIS — H5340 Unspecified visual field defects: Secondary | ICD-10-CM | POA: Diagnosis not present

## 2015-03-17 ENCOUNTER — Encounter: Payer: Medicare Other | Admitting: *Deleted

## 2015-03-17 ENCOUNTER — Other Ambulatory Visit (INDEPENDENT_AMBULATORY_CARE_PROVIDER_SITE_OTHER): Payer: Medicare Other | Admitting: *Deleted

## 2015-03-17 ENCOUNTER — Telehealth: Payer: Self-pay | Admitting: Cardiology

## 2015-03-17 ENCOUNTER — Ambulatory Visit (INDEPENDENT_AMBULATORY_CARE_PROVIDER_SITE_OTHER): Payer: Medicare Other | Admitting: *Deleted

## 2015-03-17 DIAGNOSIS — Z950 Presence of cardiac pacemaker: Secondary | ICD-10-CM

## 2015-03-17 DIAGNOSIS — I493 Ventricular premature depolarization: Secondary | ICD-10-CM | POA: Diagnosis not present

## 2015-03-17 DIAGNOSIS — K449 Diaphragmatic hernia without obstruction or gangrene: Secondary | ICD-10-CM | POA: Diagnosis not present

## 2015-03-17 DIAGNOSIS — I442 Atrioventricular block, complete: Secondary | ICD-10-CM

## 2015-03-17 DIAGNOSIS — K259 Gastric ulcer, unspecified as acute or chronic, without hemorrhage or perforation: Secondary | ICD-10-CM | POA: Diagnosis not present

## 2015-03-17 DIAGNOSIS — I10 Essential (primary) hypertension: Secondary | ICD-10-CM | POA: Diagnosis not present

## 2015-03-17 DIAGNOSIS — I2510 Atherosclerotic heart disease of native coronary artery without angina pectoris: Secondary | ICD-10-CM | POA: Diagnosis not present

## 2015-03-17 DIAGNOSIS — K253 Acute gastric ulcer without hemorrhage or perforation: Secondary | ICD-10-CM | POA: Diagnosis not present

## 2015-03-17 LAB — CUP PACEART INCLINIC DEVICE CHECK
Battery Impedance: 4344 Ohm
Battery Remaining Longevity: 8 mo
Brady Statistic AP VP Percent: 28 %
Brady Statistic AS VP Percent: 64 %
Brady Statistic AS VS Percent: 7 %
Date Time Interrogation Session: 20161121164830
Implantable Lead Implant Date: 20080204
Implantable Lead Location: 753860
Implantable Lead Model: 5076
Lead Channel Impedance Value: 537 Ohm
Lead Channel Pacing Threshold Amplitude: 0.75 V
Lead Channel Pacing Threshold Amplitude: 0.75 V
Lead Channel Pacing Threshold Pulse Width: 0.4 ms
Lead Channel Pacing Threshold Pulse Width: 0.4 ms
Lead Channel Pacing Threshold Pulse Width: 0.4 ms
Lead Channel Sensing Intrinsic Amplitude: 2 mV
Lead Channel Setting Sensing Sensitivity: 2 mV
MDC IDC LEAD IMPLANT DT: 20080204
MDC IDC LEAD LOCATION: 753859
MDC IDC MSMT BATTERY VOLTAGE: 2.65 V
MDC IDC MSMT LEADCHNL RA IMPEDANCE VALUE: 541 Ohm
MDC IDC MSMT LEADCHNL RV PACING THRESHOLD AMPLITUDE: 0.5 V
MDC IDC MSMT LEADCHNL RV PACING THRESHOLD AMPLITUDE: 0.5 V
MDC IDC MSMT LEADCHNL RV PACING THRESHOLD PULSEWIDTH: 0.4 ms
MDC IDC MSMT LEADCHNL RV SENSING INTR AMPL: 5.6 mV
MDC IDC SET LEADCHNL RA PACING AMPLITUDE: 1.5 V
MDC IDC SET LEADCHNL RV PACING AMPLITUDE: 2 V
MDC IDC SET LEADCHNL RV PACING PULSEWIDTH: 0.4 ms
MDC IDC STAT BRADY AP VS PERCENT: 1 %

## 2015-03-17 LAB — MAGNESIUM: MAGNESIUM: 2 mg/dL (ref 1.5–2.5)

## 2015-03-17 LAB — POTASSIUM: POTASSIUM: 3.8 mmol/L (ref 3.5–5.3)

## 2015-03-17 NOTE — Progress Notes (Signed)
Add on pacemaker check in clinic due to abnormal EKG during EGD. Frequent PVCs- Dr. Caryl Comes ordered Mag and K labs. Normal device function. Thresholds, sensing, impedances consistent with previous measurements. Device programmed to maximize longevity. 0.5% mode switched- no OAC, history of GIB. 8 VHR episodes- longest 21 beats. Device programmed at appropriate safety margins. Histogram distribution shows rightward shift. Device programmed to optimize intrinsic conduction. Estimated longevity <1-17 months. Patient enrolled in remote follow-up. [facility] 04/14/15 (battery check only). ROV with GT in Feb.

## 2015-03-17 NOTE — Addendum Note (Signed)
Addended by: Eulis Foster on: 03/17/2015 04:15 PM   Modules accepted: Orders

## 2015-03-17 NOTE — Telephone Encounter (Signed)
Confirmed remote transmission w/ pt daughter.   

## 2015-03-31 DIAGNOSIS — E782 Mixed hyperlipidemia: Secondary | ICD-10-CM | POA: Diagnosis not present

## 2015-03-31 DIAGNOSIS — I10 Essential (primary) hypertension: Secondary | ICD-10-CM | POA: Diagnosis not present

## 2015-03-31 DIAGNOSIS — Z1389 Encounter for screening for other disorder: Secondary | ICD-10-CM | POA: Diagnosis not present

## 2015-03-31 DIAGNOSIS — H5347 Heteronymous bilateral field defects: Secondary | ICD-10-CM | POA: Diagnosis not present

## 2015-03-31 DIAGNOSIS — Z79899 Other long term (current) drug therapy: Secondary | ICD-10-CM | POA: Diagnosis not present

## 2015-03-31 DIAGNOSIS — H6120 Impacted cerumen, unspecified ear: Secondary | ICD-10-CM | POA: Diagnosis not present

## 2015-03-31 DIAGNOSIS — H6123 Impacted cerumen, bilateral: Secondary | ICD-10-CM | POA: Diagnosis not present

## 2015-03-31 DIAGNOSIS — J309 Allergic rhinitis, unspecified: Secondary | ICD-10-CM | POA: Diagnosis not present

## 2015-03-31 DIAGNOSIS — Z0001 Encounter for general adult medical examination with abnormal findings: Secondary | ICD-10-CM | POA: Diagnosis not present

## 2015-03-31 DIAGNOSIS — K922 Gastrointestinal hemorrhage, unspecified: Secondary | ICD-10-CM | POA: Diagnosis not present

## 2015-03-31 DIAGNOSIS — E559 Vitamin D deficiency, unspecified: Secondary | ICD-10-CM | POA: Diagnosis not present

## 2015-03-31 DIAGNOSIS — K259 Gastric ulcer, unspecified as acute or chronic, without hemorrhage or perforation: Secondary | ICD-10-CM | POA: Diagnosis not present

## 2015-04-08 ENCOUNTER — Encounter: Payer: Self-pay | Admitting: Internal Medicine

## 2015-04-15 ENCOUNTER — Ambulatory Visit (INDEPENDENT_AMBULATORY_CARE_PROVIDER_SITE_OTHER): Payer: Medicare Other | Admitting: *Deleted

## 2015-04-15 ENCOUNTER — Telehealth: Payer: Self-pay | Admitting: Cardiology

## 2015-04-15 DIAGNOSIS — Z950 Presence of cardiac pacemaker: Secondary | ICD-10-CM

## 2015-04-15 NOTE — Telephone Encounter (Signed)
LMOVM reminding pt to send remote transmission.   

## 2015-04-16 NOTE — Progress Notes (Signed)
Remote pacemaker transmission.   

## 2015-04-18 ENCOUNTER — Encounter: Payer: Self-pay | Admitting: Cardiology

## 2015-04-18 LAB — CUP PACEART REMOTE DEVICE CHECK
Battery Impedance: 5118 Ohm
Battery Remaining Longevity: 5 mo
Battery Voltage: 2.61 V
Brady Statistic AP VP Percent: 21 %
Brady Statistic AS VP Percent: 61 %
Date Time Interrogation Session: 20161220221925
Implantable Lead Location: 753859
Implantable Lead Location: 753860
Implantable Lead Model: 5076
Implantable Lead Model: 5076
Lead Channel Impedance Value: 524 Ohm
Lead Channel Pacing Threshold Amplitude: 0.625 V
Lead Channel Pacing Threshold Amplitude: 0.875 V
Lead Channel Sensing Intrinsic Amplitude: 1.4 mV
Lead Channel Setting Pacing Amplitude: 2 V
Lead Channel Setting Pacing Pulse Width: 0.4 ms
MDC IDC LEAD IMPLANT DT: 20080204
MDC IDC LEAD IMPLANT DT: 20080204
MDC IDC MSMT LEADCHNL RA PACING THRESHOLD PULSEWIDTH: 0.4 ms
MDC IDC MSMT LEADCHNL RV IMPEDANCE VALUE: 492 Ohm
MDC IDC MSMT LEADCHNL RV PACING THRESHOLD PULSEWIDTH: 0.4 ms
MDC IDC SET LEADCHNL RA PACING AMPLITUDE: 1.75 V
MDC IDC SET LEADCHNL RV SENSING SENSITIVITY: 2 mV
MDC IDC STAT BRADY AP VS PERCENT: 0 %
MDC IDC STAT BRADY AS VS PERCENT: 18 %

## 2015-05-09 DIAGNOSIS — K921 Melena: Secondary | ICD-10-CM | POA: Diagnosis not present

## 2015-05-09 DIAGNOSIS — K529 Noninfective gastroenteritis and colitis, unspecified: Secondary | ICD-10-CM | POA: Diagnosis not present

## 2015-05-19 ENCOUNTER — Ambulatory Visit (INDEPENDENT_AMBULATORY_CARE_PROVIDER_SITE_OTHER): Payer: Medicare Other | Admitting: *Deleted

## 2015-05-19 DIAGNOSIS — Z950 Presence of cardiac pacemaker: Secondary | ICD-10-CM

## 2015-05-20 DIAGNOSIS — H01001 Unspecified blepharitis right upper eyelid: Secondary | ICD-10-CM | POA: Diagnosis not present

## 2015-05-20 DIAGNOSIS — H01004 Unspecified blepharitis left upper eyelid: Secondary | ICD-10-CM | POA: Diagnosis not present

## 2015-05-20 DIAGNOSIS — H01002 Unspecified blepharitis right lower eyelid: Secondary | ICD-10-CM | POA: Diagnosis not present

## 2015-05-20 DIAGNOSIS — H01005 Unspecified blepharitis left lower eyelid: Secondary | ICD-10-CM | POA: Diagnosis not present

## 2015-05-21 ENCOUNTER — Ambulatory Visit (INDEPENDENT_AMBULATORY_CARE_PROVIDER_SITE_OTHER): Payer: Medicare Other | Admitting: Neurology

## 2015-05-21 ENCOUNTER — Encounter: Payer: Self-pay | Admitting: Cardiology

## 2015-05-21 ENCOUNTER — Encounter: Payer: Self-pay | Admitting: Neurology

## 2015-05-21 VITALS — BP 155/80 | HR 65 | Ht 64.0 in | Wt 127.6 lb

## 2015-05-21 DIAGNOSIS — I63432 Cerebral infarction due to embolism of left posterior cerebral artery: Secondary | ICD-10-CM

## 2015-05-21 DIAGNOSIS — Z950 Presence of cardiac pacemaker: Secondary | ICD-10-CM

## 2015-05-21 DIAGNOSIS — K254 Chronic or unspecified gastric ulcer with hemorrhage: Secondary | ICD-10-CM

## 2015-05-21 DIAGNOSIS — I4430 Unspecified atrioventricular block: Secondary | ICD-10-CM

## 2015-05-21 DIAGNOSIS — I10 Essential (primary) hypertension: Secondary | ICD-10-CM

## 2015-05-21 NOTE — Patient Instructions (Signed)
-   will check with Dr Yolanda Bonine about the gastric ulcer status to see if ASA or plavix can be started - will continue pacemaker interrogation to see if there is afib episode, if so, we need to talk with GI regarding stronger blood thinners. - check BP at home and log and bring to Dr. Inda Merlin - Follow up with your primary care physician for stroke risk factor modification. Recommend maintain blood pressure goal <130/80, diabetes with hemoglobin A1c goal below 6.5% and lipids with LDL cholesterol goal below 70 mg/dL.  - follow up with GI and cardiology  - follow up in 4 months.

## 2015-05-22 ENCOUNTER — Telehealth: Payer: Self-pay | Admitting: Cardiology

## 2015-05-22 NOTE — Progress Notes (Signed)
STROKE NEUROLOGY FOLLOW UP NOTE  NAME: [name] DOB: 09/03/1924  REASON FOR VISIT: stroke follow up HISTORY FROM: daughter and chart  Today we had the pleasure of seeing [name] in follow-up at our Neurology Clinic. Pt was accompanied by daughter.   History Summary [name] is a 82 y.o. female with history of HTN, CAD, heart block s/p pacer, hx of GIB admitted on 12/20/14 for new GIB. She was admitted 02/2014 for duodenal ulcer. She still on ASA at home prior to this admission as per RN. She was found to have large gastric ulcer this time. However, developed "aphasia" and "left facial droop" but quickly resolved. CT negative as well as CTA head and neck. However, pt stated that she has vision changes even sicne and was found to have right hemianopia. CT repeat showed left PCA infarct. Other stroke work up all negative including 2D echo, LDL and A1C. Her stroke was suspicious for embolic pattern. However, as she is not anticoagulation candidate, no embolic work up was performed.    02/11/15 follow up - the patient has been doing well. Right hemianopia improved and followed up with ophthal showed only right lower quadrantanopsia. She follows with GI and will do EGD again on 02/14/15. Currently still not cleared for antiplatelet yet. Pt denies any heart palpitatoin. BP 141/69 today.   Interval History During the interval time, the pt has been doing well. No recurrent stroke like symptoms. She follows with [facility] Dr. [name] for gastric ulcer. As per daughter, she had last EGD done in 03/2015 and was told heals well. However, she is still not on any antiplatelet or anticoagulation for her stroke prevention. Will need to obtain further info about her gastric ulcer status. Otherwise, pt has no complains. Follows with ophthalmology and was told visual field is the same but cleared for driving by ophthalmology. BP 155/80.   REVIEW OF SYSTEMS: Full 14 system review of  systems performed and notable only for those listed below and in HPI above, all others are negative:  Constitutional:   Cardiovascular:  Ear/Nose/Throat:   Skin:  Eyes:    Respiratory:   Gastroitestinal:   Genitourinary:  Hematology/Lymphatic:   Endocrine:  Musculoskeletal:   Allergy/Immunology:   Neurological:   Psychiatric:  Sleep:   The following represents the patient's updated allergies and side effects list: Allergies  Allergen Reactions  . Clindamycin/Lincomycin Rash  . Antihistamines, Chlorpheniramine-Type     nightmares  . Ativan [Lorazepam]     Possible: agitation  . Imiquimod   . Micardis Hct [Telmisartan-Hctz]     fatigue  . Thiazide-Type Diuretics     Possible photosensitivity  . Ultracet [Tramadol-Acetaminophen]     nightmares    The neurologically relevant items on the patient's problem list were reviewed on today's visit.  Neurologic Examination  A problem focused neurological exam (12 or more points of the single system neurologic examination, vital signs counts as 1 point, cranial nerves count for 8 points) was performed.  Blood pressure 155/80, pulse 65, height 5\' 4"  (1.626 m), weight 127 lb 9.6 oz (57.879 kg).  General - Well nourished, well developed, in no apparent distress.  Ophthalmologic - Fundi not visualized due to eye movement.  Cardiovascular - Regular rate and rhythm.  Mental Status -  Level of arousal and orientation to time, place, and person were intact. Language including expression, naming, repetition, comprehension was assessed and found intact. Fund of Knowledge was assessed and was intact.  Cranial Nerves II - XII - II - Visual field exam showed right lower quandratanopia. III, IV, VI - Extraocular movements intact. V - Facial sensation intact bilaterally. VII - Facial movement intact bilaterally. VIII - Hearing & vestibular intact bilaterally. X - Palate elevates symmetrically. XI - Chin turning & shoulder shrug intact  bilaterally. XII - Tongue protrusion intact.  Motor Strength - The patient's strength was normal in all extremities and pronator drift was absent.  Bulk was normal and fasciculations were absent.   Motor Tone - Muscle tone was assessed at the neck and appendages and was normal.  Reflexes - The patient's reflexes were 1+ in all extremities and she had no pathological reflexes.  Sensory - Light touch, temperature/pinprick were assessed and were normal.    Coordination - The patient had normal movements in the hands with no ataxia or dysmetria.  Tremor was absent.  Gait and Station - difficulty getting up from chair, slow, and small stride with mild stooped posturing.  Data reviewed: I personally reviewed the images and agree with the radiology interpretations.  Dg Chest 2 View  12/20/2014 IMPRESSION: Patchy left basilar opacity -favor atelectasis over airspace disease. Consider radiographic follow-up in 2-3 weeks. T6 and L1 compression fractures of uncertain chronicity but new since 2008. Correlate clinically.   Ct Head Wo Contrast  12/22/2014 IMPRESSION: 1. No evidence of acute intracranial hemorrhage or acute stroke. 2. Atrophy and chronic small vessel ischemic changes.  CTA head and neck 12/23/14 - Prominent small vessel disease type changes. No CT evidence of large acute infarct although evaluation for detection of small or medium size infarct is limited by the white matter changes. Global atrophy without hydrocephalus. 60% diameter stenosis proximal left internal carotid artery. Minimal plaque proximal right internal carotid artery. Codominant vertebral arteries. Slight narrowing distal right vertebral artery. Anterior and posterior intracranial circulation without medium or large size vessel significant stenosis or occlusion. Mild intracranial branch vessel irregularity. By my reading, left ICA no significant stenosis per NASCET criteria.   CTA head 12/27/14 -  1. Newly  seen nonhemorrhagic left occipital pole infarct. 2. No acute arterial finding or change from 12/23/2014. No flow limiting stenosis or embolic source detected. 3. Extensive chronic small vessel disease.  2D Echocardiogram  - Left ventricle: The cavity size was normal. Wall thickness was normal. Systolic function was vigorous. The estimated ejection fraction was in the range of 65% to 70%. There is hypokinesis of the inferior myocardium. Doppler parameters are consistent with abnormal left ventricular relaxation (grade 1 diastolic dysfunction). - Right ventricle: Akinesis of the RV mid anterior free wall. Systolic pressure was increased. - Pulmonary arteries: Systolic pressure was mildly increased. PA peak pressure: 35 mm Hg (S). Impressions: - No cardiac source of emboli was indentified.  Component     Latest Ref Rng 12/20/2014 12/21/2014  Cholesterol     0 - 200 mg/dL  104  Triglycerides     <150 mg/dL  126  HDL Cholesterol     >40 mg/dL  31 (L)  Total CHOL/HDL Ratio       3.4  VLDL     0 - 40 mg/dL  25  LDL (calc)     0 - 99 mg/dL  48  Hemoglobin A1C     4.8 - 5.6 % 5.7 (H)   Mean Plasma Glucose      117   Vitamin B-12     180 - 914 pg/mL 1814 (H)   TSH  0.350 - 4.500 uIU/mL 1.037     Assessment: As you may recall, she is a 82 y.o. Caucasian female with PMH of history of HTN, CAD, heart block s/p pacer, hx of GIB admitted on 12/20/14 for new GIB due to large gastric ulcer. However, developed "aphasia" and "left facial droop" but quickly resolved. CT negative as well as CTA head and neck. However, she was found to have right hemianopia. CT repeat showed left PCA infarct. Other stroke work up all negative including 2D echo, LDL and A1C. Her stroke was suspicious for embolic pattern. However, as she is not anticoagulation candidate, no embolic work up was performed. During the interval time, right hemianopia improved to only right lower quadrantanopsia and  ophthalmology has cleared her for driving. She follows with GI and had EGD again in 03/2015, was told healing well. However, currently still not on antiplatelet yet.  Plan:  - will check with Dr [name] about the gastric ulcer status to see if ASA or plavix can be started - will continue pacemaker interrogation to see if there is afib episode, if so, will discuss with Dr. [name] regarding anticoagulation. - check BP at home and log and bring to PCP - Follow up with your primary care physician for stroke risk factor modification. Recommend maintain blood pressure goal <130/80, diabetes with hemoglobin A1c goal below 6.5% and lipids with LDL cholesterol goal below 70 mg/dL.  - follow up with GI and cardiology  - follow up in 4 months.   I spent more than 25 minutes of face to face time with the patient. Greater than 50% of time was spent in counseling and coordination of care. We have discussed about GI status, further work up and options of antiplatelet and anticoagulation at that time.   No orders of the defined types were placed in this encounter.    Meds ordered this encounter  Medications  . ofloxacin (OCUFLOX) 0.3 % ophthalmic solution    Sig:   . lidocaine (LIDODERM) 5 %    Sig:     Patient Instructions  - will check with Dr Yolanda Bonine about the gastric ulcer status to see if ASA or plavix can be started - will continue pacemaker interrogation to see if there is afib episode, if so, we need to talk with GI regarding stronger blood thinners. - check BP at home and log and bring to Dr. Inda Merlin - Follow up with your primary care physician for stroke risk factor modification. Recommend maintain blood pressure goal <130/80, diabetes with hemoglobin A1c goal below 6.5% and lipids with LDL cholesterol goal below 70 mg/dL.  - follow up with GI and cardiology  - follow up in 4 months.     Rosalin Hawking, MD PhD [facility] [address], [place], [place] [address] [phone]

## 2015-05-22 NOTE — Telephone Encounter (Signed)
LMOVM reminding pt to send remote transmission.   

## 2015-05-23 ENCOUNTER — Telehealth: Payer: Self-pay | Admitting: Cardiology

## 2015-05-23 NOTE — Telephone Encounter (Signed)
LMOVM for pt to return call. Pt implanted device reached ERI on 05-04-15.

## 2015-05-23 NOTE — Telephone Encounter (Signed)
Daughter, Melody, made aware that Ms. Genao's pacemaker is ERI as of 05/04/15. Will have scheduler call her back to arrange an appt with Dr. Lovena Le or one of his colleagues to discuss generator replacement. She is agreeable.  During the work week- best to call work # [phone]

## 2015-05-27 NOTE — Progress Notes (Signed)
Remote pacemaker transmission.   

## 2015-05-28 ENCOUNTER — Encounter: Payer: Self-pay | Admitting: Nurse Practitioner

## 2015-05-28 NOTE — Progress Notes (Signed)
Electrophysiology Office Note Date: 05/29/2015  ID:  [name], DOB 06-20-1924, MRN [id number]  PCP: [name], MD Primary Cardiologist: Irish Lack Electrophysiologist: Lovena Le  CC: Pacemaker at [place] is a 82 y.o. female seen today for Dr Lovena Le.  Her pacemaker has been fond to be at [facility] and she presents today to discuss pacemaker generator change. Since last being seen in our clinic, the patient reports doing reasonably well.  She has had increased shortness of breath, exercise intolerance, and dizziness since reaching ERI.  She denies chest pain, PND, orthopnea, nausea, vomiting, syncope, edema, weight gain, or early satiety.  Device History: MDT dual chamber PPM implanted 2008 for complete heart block    Past Medical History  Diagnosis Date  . Hypertension   . Facial rash   . Seborrheic keratoses   . Microscopic hematuria     with negative workup   . Fibrocystic disease of breast   . Anxiety   . Compression fracture of L1 lumbar vertebra (HCC)   . Allergic rhinitis   . Depression     situational  . Arthritis     Left AC joint  . Complication of anesthesia     states allergy to a med used with pacemaker - made her wild  . Vision abnormalities   . Stroke ([place])   . Complete heart block (HCC)     a. s/p MDT dual chamber pacemaker  . GI bleed     a. while on ASA   Past Surgical History  Procedure Laterality Date  . Insert / replace / remove pacemaker    . Esophagogastroduodenoscopy Left 03/22/2014    Procedure: ESOPHAGOGASTRODUODENOSCOPY (EGD);  Surgeon: Arta Silence, MD;  Location: [facility] ENDOSCOPY;  Service: Endoscopy;  Laterality: Left;  . Esophagogastroduodenoscopy [name] 12/20/2014    Procedure: ESOPHAGOGASTRODUODENOSCOPY (EGD);  Surgeon: Teena Irani, MD;  Location: [facility] ENDOSCOPY;  Service: Endoscopy;  Laterality: [name];    Current Outpatient Prescriptions  Medication Sig Dispense Refill  . amLODipine (NORVASC) 5 MG tablet Take 2.5  mg by mouth daily. TAKE 1/2 DAILY    . Calcium Carb-Cholecalciferol (CALCIUM 600+D3) 600-800 MG-UNIT TABS Take 1 tablet by mouth daily.    . cholecalciferol (VITAMIN D) 400 UNITS TABS tablet Take 2,000 Units by mouth daily.     . ferrous sulfate 325 (65 FE) MG tablet Take 325 mg by mouth daily with breakfast.    . lidocaine (LIDODERM) 5 % Place 1 patch onto the skin as directed.     Marland Kitchen losartan (COZAAR) 25 MG tablet Take 25 mg by mouth daily.    . Omega-3 Fatty Acids (FISH OIL) 1200 MG CAPS Take 1,200 mg by mouth daily.    Marland Kitchen omeprazole (PRILOSEC) 40 MG capsule Take 40 mg by mouth daily.     . traMADol (ULTRAM) 50 MG tablet Take 50 mg by mouth every 6 (six) hours as needed. TAKE AS NEEDED    . triamterene-hydrochlorothiazide (MAXZIDE-25) 37.5-25 MG tablet Take 0.5 tablets by mouth daily. TAKE 1/2 TABLET  ONE Daily    . vitamin B-12 (CYANOCOBALAMIN) 500 MCG tablet Take 500 mcg by mouth daily.     No current facility-administered medications for this visit.    Allergies:   Clindamycin/lincomycin; Antihistamines, chlorpheniramine-type; Ativan; Imiquimod; Micardis hct; Thiazide-type diuretics; and Ultracet   Social History: Social History   Social History  . Marital Status: Widowed    Spouse Name: [name]  . Number of Children: [name]  . Years of Education:  [name]   Occupational History  . Not on file.   Social History Main Topics  . Smoking status: Never Smoker   . Smokeless tobacco: Not on file  . Alcohol Use: No  . Drug Use: No  . Sexual Activity: Not on file   Other Topics Concern  . Not on file   Social History Narrative    Family History: Family History  Problem Relation Age of Onset  . Diabetes Mother   . Stroke Mother   . Coronary artery disease Mother   . Cancer - Lung Father   . Hypertension Sister   . Hypertension Brother   . Diabetes Brother   . Cancer Brother   . Hypertension Brother   . Diabetes Brother   . Cancer Brother      Review of Systems: All other  systems reviewed and are otherwise negative except as noted above.   Physical Exam: VS:  BP 160/76 mmHg  Pulse 66  Ht 5\' 4"  (1.626 m)  Wt 128 lb 3.2 oz (58.151 kg)  BMI 21.99 kg/m2 , BMI Body mass index is 21.99 kg/(m^2).  GEN- The patient is elderly and thin appearing, alert and oriented x 3 today.   HEENT: normocephalic, atraumatic; sclera clear, conjunctiva pink; hearing intact; oropharynx clear; neck supple Lungs- Clear to ausculation bilaterally, normal work of breathing.  No wheezes, rales, rhonchi Heart- Irregular rate and rhythm GI- soft, non-tender, non-distended, bowel sounds present Extremities- no clubbing, cyanosis, 1+ BLE edema; DP/PT/radial pulses 1+ bilaterally MS- no significant deformity or atrophy Skin- warm and dry, no rash or lesion; PPM pocket well healed Psych- euthymic mood, full affect Neuro- strength and sensation are intact  PPM Interrogation- reviewed in detail today,  See PACEART report  EKG:  EKG is ordered today. The ekg ordered today shows ventricular pacing at 65 with bigeminal PVC's and short run NSVT  Recent Labs: 12/20/2014: TSH 1.037 12/24/2014: ALT 11* 12/28/2014: BUN 27*; Creatinine, Ser 0.93; Hemoglobin 9.4*; Platelets 187; Sodium 139 03/17/2015: Magnesium 2.0; Potassium 3.8   Wt Readings from Last 3 Encounters:  05/29/15 128 lb 3.2 oz (58.151 kg)  05/21/15 127 lb 9.6 oz (57.879 kg)  02/11/15 127 lb 9.6 oz (57.879 kg)     Other studies Reviewed: Additional studies/ records that were reviewed today include:Dr Lovena Le and Dr Hassell Done office notes  Assessment and Plan:  1.  Complete heart block  PPM at ERI. Risks, benefits, alternatives to pacemaker generator change discussed with the patient today who wishes to proceed. Will schedule with Dr Lovena Le at next available time.   2.  HTN Above goal Consider addition of BB as below  3.  PVC's Burden significantly increased since reaching ERI Normal LVEF 11/2014 Consider addition of BB  post gen change   Current medicines are reviewed at length with the patient today.   The patient does not have concerns regarding her medicines.  The following changes were made today:  none  Labs/ tests ordered today include: none  Orders Placed This Encounter  Procedures  . EKG 12-Lead     Disposition:   Follow up with Dr Lovena Le following pacemaker generator change     Signed, Chanetta Marshall, NP 05/29/2015 10:00 AM  [facility] [address] [place] [address] [phone] (office) [phone] (fax)

## 2015-05-29 ENCOUNTER — Encounter (HOSPITAL_COMMUNITY)
Admission: EM | Disposition: A | Discharge status: Home / Self Care | Payer: Self-pay | Source: Ambulatory Visit | Attending: Internal Medicine

## 2015-05-29 ENCOUNTER — Other Ambulatory Visit: Payer: Self-pay | Admitting: Nurse Practitioner

## 2015-05-29 ENCOUNTER — Ambulatory Visit (INDEPENDENT_AMBULATORY_CARE_PROVIDER_SITE_OTHER): Payer: Medicare Other | Admitting: Nurse Practitioner

## 2015-05-29 ENCOUNTER — Encounter: Payer: Self-pay | Admitting: *Deleted

## 2015-05-29 ENCOUNTER — Encounter: Payer: Self-pay | Admitting: Nurse Practitioner

## 2015-05-29 ENCOUNTER — Ambulatory Visit (HOSPITAL_COMMUNITY)
Admission: EM | Admit: 2015-05-29 | Discharge: 2015-05-29 | Disposition: A | Discharge status: Home / Self Care | Payer: Medicare Other | Source: Ambulatory Visit | Attending: Internal Medicine | Admitting: Internal Medicine

## 2015-05-29 ENCOUNTER — Encounter: Payer: Self-pay | Admitting: Internal Medicine

## 2015-05-29 VITALS — BP 160/76 | HR 66 | Ht 64.0 in | Wt 128.2 lb

## 2015-05-29 DIAGNOSIS — I442 Atrioventricular block, complete: Secondary | ICD-10-CM

## 2015-05-29 DIAGNOSIS — Z45010 Encounter for checking and testing of cardiac pacemaker pulse generator [battery]: Secondary | ICD-10-CM | POA: Diagnosis not present

## 2015-05-29 DIAGNOSIS — Z8249 Family history of ischemic heart disease and other diseases of the circulatory system: Secondary | ICD-10-CM | POA: Diagnosis not present

## 2015-05-29 DIAGNOSIS — I493 Ventricular premature depolarization: Secondary | ICD-10-CM | POA: Diagnosis not present

## 2015-05-29 DIAGNOSIS — I10 Essential (primary) hypertension: Secondary | ICD-10-CM

## 2015-05-29 DIAGNOSIS — F419 Anxiety disorder, unspecified: Secondary | ICD-10-CM | POA: Insufficient documentation

## 2015-05-29 DIAGNOSIS — M1990 Unspecified osteoarthritis, unspecified site: Secondary | ICD-10-CM | POA: Insufficient documentation

## 2015-05-29 DIAGNOSIS — Z8673 Personal history of transient ischemic attack (TIA), and cerebral infarction without residual deficits: Secondary | ICD-10-CM | POA: Diagnosis not present

## 2015-05-29 DIAGNOSIS — F329 Major depressive disorder, single episode, unspecified: Secondary | ICD-10-CM | POA: Diagnosis not present

## 2015-05-29 DIAGNOSIS — Z950 Presence of cardiac pacemaker: Secondary | ICD-10-CM | POA: Diagnosis present

## 2015-05-29 DIAGNOSIS — I63432 Cerebral infarction due to embolism of left posterior cerebral artery: Secondary | ICD-10-CM

## 2015-05-29 HISTORY — PX: EP IMPLANTABLE DEVICE: SHX172B

## 2015-05-29 LAB — CUP PACEART INCLINIC DEVICE CHECK
Date Time Interrogation Session: 20170202101504
Implantable Lead Implant Date: 20080204
Implantable Lead Implant Date: 20080204
MDC IDC LEAD LOCATION: 753859
MDC IDC LEAD LOCATION: 753860

## 2015-05-29 LAB — BASIC METABOLIC PANEL
ANION GAP: 9 (ref 5–15)
BUN: 25 mg/dL — AB (ref 6–20)
CALCIUM: 11.2 mg/dL — AB (ref 8.9–10.3)
CO2: 28 mmol/L (ref 22–32)
Chloride: 104 mmol/L (ref 101–111)
Creatinine, Ser: 1.19 mg/dL — ABNORMAL HIGH (ref 0.44–1.00)
GFR calc Af Amer: 46 mL/min — ABNORMAL LOW (ref >60)
GFR, EST NON AFRICAN AMERICAN: 40 mL/min — AB (ref >60)
Glucose, Bld: 104 mg/dL — ABNORMAL HIGH (ref 65–99)
POTASSIUM: 3.9 mmol/L (ref 3.5–5.1)
SODIUM: 141 mmol/L (ref 135–145)

## 2015-05-29 LAB — CBC
HEMATOCRIT: 43.1 % (ref 36.0–46.0)
Hemoglobin: 14.7 g/dL (ref 12.0–15.0)
MCH: 28.3 pg (ref 26.0–34.0)
MCHC: 34.1 g/dL (ref 30.0–36.0)
MCV: 83 fL (ref 78.0–100.0)
PLATELETS: 154 10*3/uL (ref 150–400)
RBC: 5.19 MIL/uL — ABNORMAL HIGH (ref 3.87–5.11)
RDW: 20.3 % — AB (ref 11.5–15.5)
WBC: 5.9 10*3/uL (ref 4.0–10.5)

## 2015-05-29 LAB — SURGICAL PCR SCREEN
MRSA, PCR: NEGATIVE
STAPHYLOCOCCUS AUREUS: NEGATIVE

## 2015-05-29 SURGERY — PPM/BIV PPM GENERATOR CHANGEOUT
Anesthesia: LOCAL

## 2015-05-29 MED ORDER — LIDOCAINE HCL (PF) 1 % IJ SOLN
INTRAMUSCULAR | Status: AC
  Filled 2015-05-29: qty 30

## 2015-05-29 MED ORDER — MUPIROCIN 2 % EX OINT
TOPICAL_OINTMENT | CUTANEOUS | Status: AC
  Administered 2015-05-29: 1 via TOPICAL
  Filled 2015-05-29: qty 22

## 2015-05-29 MED ORDER — MUPIROCIN 2 % EX OINT
1.0000 "application " | TOPICAL_OINTMENT | Freq: Once | CUTANEOUS | Status: AC
  Administered 2015-05-29: 1 via TOPICAL

## 2015-05-29 MED ORDER — MIDAZOLAM HCL 5 MG/5ML IJ SOLN
INTRAMUSCULAR | Status: DC | PRN
  Administered 2015-05-29: 1 mg via INTRAVENOUS

## 2015-05-29 MED ORDER — MIDAZOLAM HCL 5 MG/5ML IJ SOLN
INTRAMUSCULAR | Status: AC
  Filled 2015-05-29: qty 5

## 2015-05-29 MED ORDER — SODIUM CHLORIDE 0.9 % IV SOLN
INTRAVENOUS | Status: DC
  Administered 2015-05-29: 13:00:00 via INTRAVENOUS

## 2015-05-29 MED ORDER — LIDOCAINE HCL (PF) 1 % IJ SOLN
INTRAMUSCULAR | Status: DC | PRN
  Administered 2015-05-29: 40 mL

## 2015-05-29 MED ORDER — CEFAZOLIN SODIUM-DEXTROSE 2-3 GM-% IV SOLR
INTRAVENOUS | Status: DC | PRN
  Administered 2015-05-29 (×2): 2 g via INTRAVENOUS

## 2015-05-29 MED ORDER — FENTANYL CITRATE (PF) 100 MCG/2ML IJ SOLN
INTRAMUSCULAR | Status: DC | PRN
  Administered 2015-05-29: 12.5 ug via INTRAVENOUS

## 2015-05-29 MED ORDER — SODIUM CHLORIDE 0.9 % IR SOLN
80.0000 mg | Status: DC
  Filled 2015-05-29: qty 2

## 2015-05-29 MED ORDER — FENTANYL CITRATE (PF) 100 MCG/2ML IJ SOLN
INTRAMUSCULAR | Status: AC
  Filled 2015-05-29: qty 2

## 2015-05-29 MED ORDER — CHLORHEXIDINE GLUCONATE 4 % EX LIQD: 60.0000 mL | Freq: Once | CUTANEOUS | Status: DC

## 2015-05-29 MED ORDER — CEFAZOLIN SODIUM-DEXTROSE 2-3 GM-% IV SOLR: 2.0000 g | INTRAVENOUS | Status: DC

## 2015-05-29 MED ORDER — CEFAZOLIN SODIUM-DEXTROSE 2-3 GM-% IV SOLR
INTRAVENOUS | Status: AC
  Filled 2015-05-29: qty 50

## 2015-05-29 MED ORDER — GENTAMICIN SULFATE 40 MG/ML IJ SOLN
INTRAMUSCULAR | Status: AC
  Filled 2015-05-29: qty 2

## 2015-05-29 MED ORDER — ONDANSETRON HCL 4 MG/2ML IJ SOLN: 4.0000 mg | Freq: Four times a day (QID) | INTRAMUSCULAR | Status: DC | PRN

## 2015-05-29 SURGICAL SUPPLY — 5 items
CABLE SURGICAL S-101-97-12 (CABLE) ×1 IMPLANT
PACEMAKER ADAPTA DR ADDRL1 (Pacemaker) IMPLANT
PAD DEFIB LIFELINK (PAD) ×1 IMPLANT
PPM ADAPTA DR ADDRL1 (Pacemaker) ×2 IMPLANT
TRAY PACEMAKER INSERTION (PACKS) ×1 IMPLANT

## 2015-05-29 NOTE — H&P (Signed)
Electrophysiology Office Note Date: 05/29/2015  ID: [name], DOB [birth year], MRN [id number]  PCP: [name], MD Primary Cardiologist: Irish Lack Electrophysiologist: Lovena Le  CC: Pacemaker at [place] is a 82 y.o. female seen today for Dr Lovena Le. Her pacemaker has been fond to be at [facility] and she presents today to discuss pacemaker generator change. Since last being seen in our clinic, the patient reports doing reasonably well. She has had increased shortness of breath, exercise intolerance, and dizziness since reaching ERI. She denies chest pain, PND, orthopnea, nausea, vomiting, syncope, edema, weight gain, or early satiety.  Device History: MDT dual chamber PPM implanted 2008 for complete heart block    Past Medical History  Diagnosis Date  . Hypertension   . Facial rash   . Seborrheic keratoses   . Microscopic hematuria     with negative workup   . Fibrocystic disease of breast   . Anxiety   . Compression fracture of L1 lumbar vertebra (HCC)   . Allergic rhinitis   . Depression     situational  . Arthritis     Left AC joint  . Complication of anesthesia     states allergy to a med used with pacemaker - made her wild  . Vision abnormalities   . Stroke ([place])   . Complete heart block (HCC)     a. s/p MDT dual chamber pacemaker  . GI bleed     a. while on ASA   Past Surgical History  Procedure Laterality Date  . Insert / replace / remove pacemaker    . Esophagogastroduodenoscopy Left 03/22/2014    Procedure: ESOPHAGOGASTRODUODENOSCOPY (EGD); Surgeon: Arta Silence, MD; Location[facility] ENDOSCOPY; Service: Endoscopy; Laterality: Left;  . Esophagogastroduodenoscopy [name] 12/20/2014    Procedure: ESOPHAGOGASTRODUODENOSCOPY (EGD); Surgeon: Teena Irani, MD; Location[facility] ENDOSCOPY; Service: Endoscopy; Laterality: [name];    Current Outpatient  Prescriptions  Medication Sig Dispense Refill  . amLODipine (NORVASC) 5 MG tablet Take 2.5 mg by mouth daily. TAKE 1/2 DAILY    . Calcium Carb-Cholecalciferol (CALCIUM 600+D3) 600-800 MG-UNIT TABS Take 1 tablet by mouth daily.    . cholecalciferol (VITAMIN D) 400 UNITS TABS tablet Take 2,000 Units by mouth daily.     . ferrous sulfate 325 (65 FE) MG tablet Take 325 mg by mouth daily with breakfast.    . lidocaine (LIDODERM) 5 % Place 1 patch onto the skin as directed.     Marland Kitchen losartan (COZAAR) 25 MG tablet Take 25 mg by mouth daily.    . Omega-3 Fatty Acids (FISH OIL) 1200 MG CAPS Take 1,200 mg by mouth daily.    Marland Kitchen omeprazole (PRILOSEC) 40 MG capsule Take 40 mg by mouth daily.     . traMADol (ULTRAM) 50 MG tablet Take 50 mg by mouth every 6 (six) hours as needed. TAKE AS NEEDED    . triamterene-hydrochlorothiazide (MAXZIDE-25) 37.5-25 MG tablet Take 0.5 tablets by mouth daily. TAKE 1/2 TABLET ONE Daily    . vitamin B-12 (CYANOCOBALAMIN) 500 MCG tablet Take 500 mcg by mouth daily.     No current facility-administered medications for this visit.    Allergies: Clindamycin/lincomycin; Antihistamines, chlorpheniramine-type; Ativan; Imiquimod; Micardis hct; Thiazide-type diuretics; and Ultracet   Social History: Social History   Social History  . Marital Status: Widowed    Spouse Name: [name]  . Number of Children: [name]  . Years of Education: [name]   Occupational History  . Not on file.   Social History Main Topics  .  Smoking status: Never Smoker   . Smokeless tobacco: Not on file  . Alcohol Use: No  . Drug Use: No  . Sexual Activity: Not on file   Other Topics Concern  . Not on file   Social History Narrative    Family History: Family History  Problem Relation Age of Onset  . Diabetes Mother   . Stroke Mother   . Coronary artery disease Mother   .  Cancer - Lung Father   . Hypertension Sister   . Hypertension Brother   . Diabetes Brother   . Cancer Brother   . Hypertension Brother   . Diabetes Brother   . Cancer Brother      Review of Systems: All other systems reviewed and are otherwise negative except as noted above.   Physical Exam: VS: BP 160/76 mmHg  Pulse 66  Ht 5\' 4"  (1.626 m)  Wt 128 lb 3.2 oz (58.151 kg)  BMI 21.99 kg/m2 , BMI Body mass index is 21.99 kg/(m^2).  GEN- The patient is elderly and thin appearing, alert and oriented x 3 today.  HEENT: normocephalic, atraumatic; sclera clear, conjunctiva pink; hearing intact; oropharynx clear; neck supple Lungs- Clear to ausculation bilaterally, normal work of breathing. No wheezes, rales, rhonchi Heart- Irregular rate and rhythm GI- soft, non-tender, non-distended, bowel sounds present Extremities- no clubbing, cyanosis, 1+ BLE edema; DP/PT/radial pulses 1+ bilaterally MS- no significant deformity or atrophy Skin- warm and dry, no rash or lesion; PPM pocket well healed Psych- euthymic mood, full affect Neuro- strength and sensation are intact  PPM Interrogation- reviewed in detail today, See PACEART report  EKG: EKG is ordered today. The ekg ordered today shows ventricular pacing at 65 with bigeminal PVC's and short run NSVT  Recent Labs: 12/20/2014: TSH 1.037 12/24/2014: ALT 11* 12/28/2014: BUN 27*; Creatinine, Ser 0.93; Hemoglobin 9.4*; Platelets 187; Sodium 139 03/17/2015: Magnesium 2.0; Potassium 3.8   Wt Readings from Last 3 Encounters:  05/29/15 128 lb 3.2 oz (58.151 kg)  05/21/15 127 lb 9.6 oz (57.879 kg)  02/11/15 127 lb 9.6 oz (57.879 kg)     Other studies Reviewed: Additional studies/ records that were reviewed today include:Dr Lovena Le and Dr Hassell Done office notes  Assessment and Plan:  1. Complete heart block  PPM at ERI. Risks, benefits, alternatives to pacemaker generator change discussed with  the patient today who wishes to proceed. Will schedule with Dr Lovena Le at next available time.   2. HTN Above goal Consider addition of BB as below  3. PVC's Burden significantly increased since reaching ERI Normal LVEF 11/2014 Consider addition of BB post gen change   Current medicines are reviewed at length with the patient today.  The patient does not have concerns regarding her medicines. The following changes were made today: none  Labs/ tests ordered today include: none  Orders Placed This Encounter  Procedures  . EKG 12-Lead     Disposition: Follow up with Dr Lovena Le following pacemaker generator change    Signed, Chanetta Marshall, NP 05/29/2015 10:00 AM  [facility] [address] [place] [address] [phone] (office) [phone] (fax)        EP Attending  Patient seen and examined. Agree with the findings as noted above. The patient has reached ERI and reverted to VVI pacing and is symptomatic. Will plan to proceed with PM gen change out.  Cristopher Peru, M.D.

## 2015-05-29 NOTE — Discharge Instructions (Signed)

## 2015-05-29 NOTE — Patient Instructions (Signed)
                                                                                                                                           Medication Instructions:     CONTINUE SAME MEDICATIONS  If you need a refill on your cardiac medications before your next appointment, please call your pharmacy.  Labwork: WILL BE DONE AT HOSPITAL    Testing/Procedures:   SEE LETTER FOR CHANGE OUT TODAY    Follow-Up:   10 DAY WOUND CHECK AFTER TODAY 05/29/15                                                91 DAYS AFTER TODAY 05/29/15 FOR  PHYS PACER CHK WITH DR Lovena Le    Any Other Special Instructions Will Be Listed Below (If Applicable).

## 2015-05-30 ENCOUNTER — Encounter (HOSPITAL_COMMUNITY): Payer: Self-pay | Admitting: Internal Medicine

## 2015-06-02 LAB — CUP PACEART REMOTE DEVICE CHECK
Brady Statistic RV Percent Paced: 61 %
Date Time Interrogation Session: 20170126212828
Implantable Lead Location: 753859
Implantable Lead Model: 5076
Lead Channel Impedance Value: 527 Ohm
Lead Channel Setting Pacing Amplitude: 2 V
Lead Channel Setting Pacing Pulse Width: 0.4 ms
MDC IDC LEAD IMPLANT DT: 20080204
MDC IDC LEAD IMPLANT DT: 20080204
MDC IDC LEAD LOCATION: 753860
MDC IDC MSMT BATTERY IMPEDANCE: 6789 Ohm
MDC IDC MSMT BATTERY REMAINING LONGEVITY: 4 mo
MDC IDC MSMT BATTERY VOLTAGE: 2.62 V
MDC IDC MSMT LEADCHNL RA IMPEDANCE VALUE: 67 Ohm
MDC IDC SET LEADCHNL RV SENSING SENSITIVITY: 2.8 mV

## 2015-06-12 ENCOUNTER — Encounter: Payer: Self-pay | Admitting: Internal Medicine

## 2015-06-12 ENCOUNTER — Ambulatory Visit (INDEPENDENT_AMBULATORY_CARE_PROVIDER_SITE_OTHER): Payer: Medicare Other | Admitting: *Deleted

## 2015-06-12 DIAGNOSIS — Z950 Presence of cardiac pacemaker: Secondary | ICD-10-CM | POA: Diagnosis not present

## 2015-06-12 DIAGNOSIS — I493 Ventricular premature depolarization: Secondary | ICD-10-CM

## 2015-06-12 DIAGNOSIS — D509 Iron deficiency anemia, unspecified: Secondary | ICD-10-CM | POA: Diagnosis not present

## 2015-06-12 DIAGNOSIS — I442 Atrioventricular block, complete: Secondary | ICD-10-CM

## 2015-06-12 LAB — CUP PACEART INCLINIC DEVICE CHECK
Battery Impedance: 100 Ohm
Battery Remaining Longevity: 142 mo
Brady Statistic AP VS Percent: 0 %
Brady Statistic AS VS Percent: 21 %
Date Time Interrogation Session: 20170216183257
Implantable Lead Implant Date: 20080204
Implantable Lead Model: 5076
Lead Channel Impedance Value: 555 Ohm
Lead Channel Impedance Value: 561 Ohm
Lead Channel Pacing Threshold Pulse Width: 0.4 ms
Lead Channel Pacing Threshold Pulse Width: 0.4 ms
Lead Channel Setting Pacing Amplitude: 2 V
Lead Channel Setting Pacing Pulse Width: 0.4 ms
Lead Channel Setting Sensing Sensitivity: 5.6 mV
MDC IDC LEAD IMPLANT DT: 20080204
MDC IDC LEAD LOCATION: 753859
MDC IDC LEAD LOCATION: 753860
MDC IDC MSMT BATTERY VOLTAGE: 2.79 V
MDC IDC MSMT LEADCHNL RA PACING THRESHOLD AMPLITUDE: 1 V
MDC IDC MSMT LEADCHNL RA SENSING INTR AMPL: 4 mV
MDC IDC MSMT LEADCHNL RV PACING THRESHOLD AMPLITUDE: 0.75 V
MDC IDC SET LEADCHNL RV PACING AMPLITUDE: 2.5 V
MDC IDC STAT BRADY AP VP PERCENT: 37 %
MDC IDC STAT BRADY AS VP PERCENT: 42 %

## 2015-06-12 NOTE — Progress Notes (Signed)
Wound check appointment s/p generator change on 05/29/15. Steri-strips removed. Wound without redness or edema. Incision edges approximated, wound well healed. Normal device function. Thresholds, sensing, and impedances consistent with implant measurements. Device programmed at chronic outputs. Histogram distribution appropriate for patient and level of activity. 2 mode switches (<0.1%), no EGMs as <30 sec in duration. No high ventricular rates noted. Patient reports increased SOB and LE edema for past two weeks, note PVC counters indicate 419,089 single PVCs and 21,545 PVC runs; reviewed with Dr. Lovena Le, who recommended 48 hour Holter and follow-up appointment tomorrow. Patient educated about wound care, arm mobility, lifting restrictions. ROV with GT on 06/13/15 at 9:00am.

## 2015-06-13 ENCOUNTER — Ambulatory Visit (INDEPENDENT_AMBULATORY_CARE_PROVIDER_SITE_OTHER): Payer: Medicare Other | Admitting: Internal Medicine

## 2015-06-13 ENCOUNTER — Encounter: Payer: Self-pay | Admitting: Internal Medicine

## 2015-06-13 VITALS — BP 150/90 | HR 81 | Ht 64.5 in | Wt 128.0 lb

## 2015-06-13 DIAGNOSIS — I442 Atrioventricular block, complete: Secondary | ICD-10-CM | POA: Diagnosis not present

## 2015-06-13 DIAGNOSIS — Z79899 Other long term (current) drug therapy: Secondary | ICD-10-CM

## 2015-06-13 DIAGNOSIS — I63432 Cerebral infarction due to embolism of left posterior cerebral artery: Secondary | ICD-10-CM | POA: Diagnosis not present

## 2015-06-13 DIAGNOSIS — I493 Ventricular premature depolarization: Secondary | ICD-10-CM | POA: Diagnosis not present

## 2015-06-13 MED ORDER — FLECAINIDE ACETATE 50 MG PO TABS: 50.0000 mg | ORAL_TABLET | Freq: Two times a day (BID) | ORAL | Status: DC

## 2015-06-13 NOTE — Progress Notes (Signed)
HPI [name] returns today for ongoing evaluation and management of her palpitations. She is a pleasant 82 yo woman, with a h/o CHB, s/p PPM insertion and HTN. The patient denies anginal symptoms. No sob or syncope. Minimal peripheral edema. In the interim, she has had a GI bleed. She is no longer on systemic anti-coagulation. The patient has most recently been bothered by palpitations and weakness and a low pulse. She has had a very significant increase in her ventricular ectopy. She has not had any anginal symptoms. Review of her old records is notable for an abnormal stress test remotely but she has never had a cardiac cath and she has normal LV function.  Allergies  Allergen Reactions  . Clindamycin/Lincomycin Rash  . Antihistamines, Chlorpheniramine-Type     nightmares  . Ativan [Lorazepam]     Possible: agitation  . Imiquimod   . Micardis Hct [Telmisartan-Hctz]     fatigue  . Thiazide-Type Diuretics     Possible photosensitivity  . Ultracet [Tramadol-Acetaminophen]     nightmares     Current Outpatient Prescriptions  Medication Sig Dispense Refill  . amLODipine (NORVASC) 5 MG tablet Take 2.5 mg by mouth daily. TAKE 1/2 DAILY    . Calcium Carb-Cholecalciferol (CALCIUM 600+D3) 600-800 MG-UNIT TABS Take 1 tablet by mouth daily.    . cholecalciferol (VITAMIN D) 400 UNITS TABS tablet Take 2,000 Units by mouth daily.     . ferrous sulfate 325 (65 FE) MG tablet Take 325 mg by mouth daily with breakfast.    . lidocaine (LIDODERM) 5 % Place 1 patch onto the skin daily. Place one patch on to skin as directed.  Remove & Discard patch within 12 hours or as directed by MD    . losartan (COZAAR) 25 MG tablet Take 25 mg by mouth daily.    . Omega-3 Fatty Acids (FISH OIL) 1200 MG CAPS Take 1,200 mg by mouth daily.    Marland Kitchen omeprazole (PRILOSEC) 40 MG capsule Take 40 mg by mouth daily.    . traMADol (ULTRAM) 50 MG tablet Take 50 mg by mouth every 6 (six) hours as needed. TAKE AS NEEDED     . triamterene-hydrochlorothiazide (MAXZIDE-25) 37.5-25 MG tablet Take 1 tablet by mouth daily. Take one-half tablet daily    . vitamin B-12 (CYANOCOBALAMIN) 500 MCG tablet Take 500 mcg by mouth daily.    . flecainide (TAMBOCOR) 50 MG tablet Take 1 tablet (50 mg total) by mouth 2 (two) times daily. 60 tablet 0   No current facility-administered medications for this visit.     Past Medical History  Diagnosis Date  . Hypertension   . Facial rash   . Seborrheic keratoses   . Microscopic hematuria     with negative workup   . Fibrocystic disease of breast   . Anxiety   . Compression fracture of L1 lumbar vertebra (HCC)   . Allergic rhinitis   . Depression     situational  . Arthritis     Left AC joint  . Complication of anesthesia     states allergy to a med used with pacemaker - made her wild  . Vision abnormalities   . Stroke ([place])   . Complete heart block (HCC)     a. s/p MDT dual chamber pacemaker  . GI bleed     a. while on ASA    ROS:   All systems reviewed and negative except as noted in the HPI.   Past Surgical  History  Procedure Laterality Date  . Insert / replace / remove pacemaker    . Esophagogastroduodenoscopy Left 03/22/2014    Procedure: ESOPHAGOGASTRODUODENOSCOPY (EGD);  Surgeon: Arta Silence, MD;  Location: [facility] ENDOSCOPY;  Service: Endoscopy;  Laterality: Left;  . Esophagogastroduodenoscopy [name] 12/20/2014    Procedure: ESOPHAGOGASTRODUODENOSCOPY (EGD);  Surgeon: Teena Irani, MD;  Location[facility] ENDOSCOPY;  Service: Endoscopy;  Laterality: [name];  . Ep implantable device [name] 05/29/2015    Procedure: PPM/BIV PPM Generator Changeout;  Surgeon: Evans Lance, MD;  Location: [facility] CV LAB;  Service: Cardiovascular;  Laterality: [name];     Family History  Problem Relation Age of Onset  . Diabetes Mother   . Stroke Mother   . Coronary artery disease Mother   . Cancer - Lung Father   . Hypertension Sister   . Hypertension Brother   . Diabetes Brother   .  Cancer Brother   . Hypertension Brother   . Diabetes Brother   . Cancer Brother      Social History   Social History  . Marital Status: Widowed    Spouse Name: [name]  . Number of Children: [name]  . Years of Education: [name]   Occupational History  . Not on file.   Social History Main Topics  . Smoking status: Never Smoker   . Smokeless tobacco: Not on file  . Alcohol Use: No  . Drug Use: No  . Sexual Activity: Not on file   Other Topics Concern  . Not on file   Social History Narrative     BP 150/90 mmHg  Pulse 81  Ht 5' 4.5" (1.638 m)  Wt 128 lb (58.06 kg)  BMI 21.64 kg/m2  Physical Exam:  Well appearing elderly woman, NAD HEENT: Unremarkable Neck:  6 cm JVD, no thyromegally Back:  No CVA tenderness Lungs:  Clear with no wheezes, rales, or rhonchi HEART:  Regular rate rhythm, no murmurs, no rubs, no clicks Abd:  soft, positive bowel sounds, no organomegally, no rebound, no guarding Ext:  2 plus pulses, no edema, no cyanosis, no clubbing Skin:  No rashes no nodules Neuro:  CN II through XII intact, motor grossly intact  ECG - NSR with AV pacing and PVC's in a bigeminal fashion.  DEVICE  Normal device function.  See PaceArt for details.   Assess/Plan: 1. PVC's - this is a new problem. With her advanced age and because she is symptomatic, I have recommended we try to suppress her symptoms with medications rather than ablation. Will start very low dose flecainide and titrate up as needed. She will return for an ECG in 2 weeks.  2. CHB - she is s/p PPM and stable. No escape 3. PPM - she is s/p gen change and doing well from a postop perspective. 4. HTN - her blood pressure is a bit high. We will consider adjusting her meds. I might start coreg when we see her again if her pressure remains high.  Mikle Bosworth.D.

## 2015-06-13 NOTE — Patient Instructions (Signed)
Medication Instructions:  Your physician has recommended you make the following change in your medication:  1) START Flecainide 50 mg twice a day  Labwork: None ordered  Testing/Procedures: None ordered  Follow-Up: Your physician recommends that you schedule a follow-up appointment in: 2 weeks for nurse visit EKG.  Your physician recommends that you schedule a follow-up appointment in: 1 month with Dr. Lovena Le.  If you need a refill on your cardiac medications before your next appointment, please call your pharmacy.  Thank you for choosing [facility]!!

## 2015-06-23 ENCOUNTER — Ambulatory Visit (INDEPENDENT_AMBULATORY_CARE_PROVIDER_SITE_OTHER): Payer: Medicare Other | Admitting: *Deleted

## 2015-06-23 DIAGNOSIS — Z950 Presence of cardiac pacemaker: Secondary | ICD-10-CM | POA: Diagnosis not present

## 2015-06-23 DIAGNOSIS — I10 Essential (primary) hypertension: Secondary | ICD-10-CM

## 2015-06-23 DIAGNOSIS — I493 Ventricular premature depolarization: Secondary | ICD-10-CM | POA: Diagnosis not present

## 2015-06-23 LAB — BASIC METABOLIC PANEL
BUN: 29 mg/dL — ABNORMAL HIGH (ref 7–25)
CALCIUM: 11.6 mg/dL — AB (ref 8.6–10.4)
CO2: 30 mmol/L (ref 20–31)
Chloride: 101 mmol/L (ref 98–110)
Creat: 1.09 mg/dL — ABNORMAL HIGH (ref 0.60–0.88)
GLUCOSE: 98 mg/dL (ref 65–99)
POTASSIUM: 4.4 mmol/L (ref 3.5–5.3)
SODIUM: 139 mmol/L (ref 135–146)

## 2015-06-23 MED ORDER — CARVEDILOL 6.25 MG PO TABS: 6.2500 mg | ORAL_TABLET | Freq: Two times a day (BID) | ORAL | Status: DC

## 2015-06-23 NOTE — Progress Notes (Signed)
1.) Reason for visit: EKG 2 weeks after initiating flecainide, and BP check  2.) Name of MD requesting visit: Dr. Lovena Le  3.) H&P: started flecainide on 06/13/15 for PVCs; also BP was elevated that visit, "consider adjusting meds when we see her again"  4.) ROS related to problem: patient feels good today, taking all meds as prescribed. EKG and BP reviewed by Dr. Lovena Le.  5.) Assessment and plan per MD: No change in flecainide dose, get BMET today; stop amlodipine; start carvedilol 6.25 mg BID for BP

## 2015-06-23 NOTE — Patient Instructions (Signed)
Your physician has recommended you make the following change in your medication:  1.) stop amlodipine (for blood pressure) 2.) START carvedilol (Coreg) 6.25 mg two times a day (for blood pressure)  Your physician recommends that you return for lab work in: today Artist)

## 2015-07-11 ENCOUNTER — Other Ambulatory Visit: Payer: Self-pay | Admitting: *Deleted

## 2015-07-11 DIAGNOSIS — I493 Ventricular premature depolarization: Secondary | ICD-10-CM

## 2015-07-11 MED ORDER — FLECAINIDE ACETATE 50 MG PO TABS: 50.0000 mg | ORAL_TABLET | Freq: Two times a day (BID) | ORAL | Status: DC

## 2015-07-15 ENCOUNTER — Emergency Department (HOSPITAL_COMMUNITY): Payer: Medicare Other

## 2015-07-15 ENCOUNTER — Encounter (HOSPITAL_COMMUNITY): Payer: Self-pay | Admitting: Neurology

## 2015-07-15 DIAGNOSIS — Z8669 Personal history of other diseases of the nervous system and sense organs: Secondary | ICD-10-CM | POA: Diagnosis not present

## 2015-07-15 DIAGNOSIS — S0990XA Unspecified injury of head, initial encounter: Secondary | ICD-10-CM | POA: Diagnosis not present

## 2015-07-15 DIAGNOSIS — Y9301 Activity, walking, marching and hiking: Secondary | ICD-10-CM | POA: Insufficient documentation

## 2015-07-15 DIAGNOSIS — R42 Dizziness and giddiness: Secondary | ICD-10-CM | POA: Insufficient documentation

## 2015-07-15 DIAGNOSIS — Y92007 Garden or yard of unspecified non-institutional (private) residence as the place of occurrence of the external cause: Secondary | ICD-10-CM | POA: Insufficient documentation

## 2015-07-15 DIAGNOSIS — S60211A Contusion of right wrist, initial encounter: Secondary | ICD-10-CM | POA: Diagnosis not present

## 2015-07-15 DIAGNOSIS — Z8673 Personal history of transient ischemic attack (TIA), and cerebral infarction without residual deficits: Secondary | ICD-10-CM | POA: Insufficient documentation

## 2015-07-15 DIAGNOSIS — Z872 Personal history of diseases of the skin and subcutaneous tissue: Secondary | ICD-10-CM | POA: Diagnosis not present

## 2015-07-15 DIAGNOSIS — S300XXA Contusion of lower back and pelvis, initial encounter: Secondary | ICD-10-CM | POA: Insufficient documentation

## 2015-07-15 DIAGNOSIS — Z7901 Long term (current) use of anticoagulants: Secondary | ICD-10-CM | POA: Insufficient documentation

## 2015-07-15 DIAGNOSIS — I10 Essential (primary) hypertension: Secondary | ICD-10-CM | POA: Diagnosis not present

## 2015-07-15 DIAGNOSIS — Z8781 Personal history of (healed) traumatic fracture: Secondary | ICD-10-CM | POA: Diagnosis not present

## 2015-07-15 DIAGNOSIS — S199XXA Unspecified injury of neck, initial encounter: Secondary | ICD-10-CM | POA: Diagnosis not present

## 2015-07-15 DIAGNOSIS — Z23 Encounter for immunization: Secondary | ICD-10-CM | POA: Diagnosis not present

## 2015-07-15 DIAGNOSIS — Z8742 Personal history of other diseases of the female genital tract: Secondary | ICD-10-CM | POA: Insufficient documentation

## 2015-07-15 DIAGNOSIS — M25551 Pain in right hip: Secondary | ICD-10-CM | POA: Diagnosis not present

## 2015-07-15 DIAGNOSIS — F419 Anxiety disorder, unspecified: Secondary | ICD-10-CM | POA: Diagnosis not present

## 2015-07-15 DIAGNOSIS — M545 Low back pain: Secondary | ICD-10-CM | POA: Diagnosis not present

## 2015-07-15 DIAGNOSIS — W1789XA Other fall from one level to another, initial encounter: Secondary | ICD-10-CM | POA: Insufficient documentation

## 2015-07-15 DIAGNOSIS — Y999 Unspecified external cause status: Secondary | ICD-10-CM | POA: Insufficient documentation

## 2015-07-15 DIAGNOSIS — S79911A Unspecified injury of right hip, initial encounter: Secondary | ICD-10-CM | POA: Insufficient documentation

## 2015-07-15 DIAGNOSIS — Z950 Presence of cardiac pacemaker: Secondary | ICD-10-CM | POA: Diagnosis not present

## 2015-07-15 DIAGNOSIS — S51011A Laceration without foreign body of right elbow, initial encounter: Secondary | ICD-10-CM | POA: Diagnosis not present

## 2015-07-15 DIAGNOSIS — S3992XA Unspecified injury of lower back, initial encounter: Secondary | ICD-10-CM | POA: Diagnosis not present

## 2015-07-15 DIAGNOSIS — M19012 Primary osteoarthritis, left shoulder: Secondary | ICD-10-CM | POA: Diagnosis not present

## 2015-07-15 DIAGNOSIS — S50311A Abrasion of right elbow, initial encounter: Secondary | ICD-10-CM | POA: Diagnosis not present

## 2015-07-15 DIAGNOSIS — Z8719 Personal history of other diseases of the digestive system: Secondary | ICD-10-CM | POA: Diagnosis not present

## 2015-07-15 DIAGNOSIS — Z79899 Other long term (current) drug therapy: Secondary | ICD-10-CM | POA: Diagnosis not present

## 2015-07-15 DIAGNOSIS — S59901A Unspecified injury of right elbow, initial encounter: Secondary | ICD-10-CM | POA: Diagnosis present

## 2015-07-15 LAB — BASIC METABOLIC PANEL
ANION GAP: 11 (ref 5–15)
BUN: 26 mg/dL — ABNORMAL HIGH (ref 6–20)
CALCIUM: 11 mg/dL — AB (ref 8.9–10.3)
CO2: 28 mmol/L (ref 22–32)
Chloride: 104 mmol/L (ref 101–111)
Creatinine, Ser: 1.03 mg/dL — ABNORMAL HIGH (ref 0.44–1.00)
GFR, EST AFRICAN AMERICAN: 55 mL/min — AB (ref >60)
GFR, EST NON AFRICAN AMERICAN: 47 mL/min — AB (ref >60)
GLUCOSE: 102 mg/dL — AB (ref 65–99)
POTASSIUM: 4.8 mmol/L (ref 3.5–5.1)
Sodium: 143 mmol/L (ref 135–145)

## 2015-07-15 LAB — CBC
HEMATOCRIT: 41.9 % (ref 36.0–46.0)
Hemoglobin: 14.4 g/dL (ref 12.0–15.0)
MCH: 29.5 pg (ref 26.0–34.0)
MCHC: 34.4 g/dL (ref 30.0–36.0)
MCV: 85.9 fL (ref 78.0–100.0)
PLATELETS: 127 10*3/uL — AB (ref 150–400)
RBC: 4.88 MIL/uL (ref 3.87–5.11)
RDW: 14.9 % (ref 11.5–15.5)
WBC: 9.2 10*3/uL (ref 4.0–10.5)

## 2015-07-15 LAB — I-STAT TROPONIN, ED: Troponin i, poc: 0.01 ng/mL (ref 0.00–0.08)

## 2015-07-15 NOTE — ED Notes (Signed)
Today pt was walking back inside after working in the yard for 1 hour, when she walked in she got real dizzy and fell backwards with LOC. When pt woke up, she crawled to the phone to call her daughter. Pt needed assistance to get out of the floor. Pt is ambulatory but has right hip pain, right elbow pain and pain to back of head. No blood thinners. Pt is a x 4. Here with daughter.

## 2015-07-16 ENCOUNTER — Emergency Department (HOSPITAL_COMMUNITY)
Admission: EM | Admit: 2015-07-16 | Discharge: 2015-07-16 | Disposition: A | Discharge status: Home / Self Care | Payer: Medicare Other | Attending: Emergency Medicine | Admitting: Emergency Medicine

## 2015-07-16 ENCOUNTER — Emergency Department (HOSPITAL_COMMUNITY): Payer: Medicare Other

## 2015-07-16 DIAGNOSIS — W19XXXA Unspecified fall, initial encounter: Secondary | ICD-10-CM

## 2015-07-16 DIAGNOSIS — S3992XA Unspecified injury of lower back, initial encounter: Secondary | ICD-10-CM | POA: Diagnosis not present

## 2015-07-16 DIAGNOSIS — S51011A Laceration without foreign body of right elbow, initial encounter: Secondary | ICD-10-CM | POA: Diagnosis not present

## 2015-07-16 DIAGNOSIS — M545 Low back pain: Secondary | ICD-10-CM | POA: Diagnosis not present

## 2015-07-16 MED ORDER — OXYCODONE-ACETAMINOPHEN 5-325 MG PO TABS: 1.0000 | ORAL_TABLET | Freq: Four times a day (QID) | ORAL | Status: DC | PRN

## 2015-07-16 MED ORDER — TETANUS-DIPHTH-ACELL PERTUSSIS 5-2.5-18.5 LF-MCG/0.5 IM SUSP
0.5000 mL | Freq: Once | INTRAMUSCULAR | Status: AC
  Administered 2015-07-16: 0.5 mL via INTRAMUSCULAR

## 2015-07-16 MED ORDER — LIDOCAINE-EPINEPHRINE (PF) 2 %-1:200000 IJ SOLN
10.0000 mL | Freq: Once | INTRAMUSCULAR | Status: AC
  Administered 2015-07-16: 10 mL
  Filled 2015-07-16: qty 20

## 2015-07-16 MED ORDER — OXYCODONE-ACETAMINOPHEN 5-325 MG PO TABS
1.0000 | ORAL_TABLET | Freq: Once | ORAL | Status: AC
  Administered 2015-07-16: 1 via ORAL

## 2015-07-16 MED ORDER — TETANUS-DIPHTH-ACELL PERTUSSIS 5-2.5-18.5 LF-MCG/0.5 IM SUSP
INTRAMUSCULAR | Status: AC
  Filled 2015-07-16: qty 0.5

## 2015-07-16 MED ORDER — OXYCODONE-ACETAMINOPHEN 5-325 MG PO TABS
ORAL_TABLET | ORAL | Status: AC
  Filled 2015-07-16: qty 1

## 2015-07-16 NOTE — ED Provider Notes (Signed)
CSN: [id number]     Arrival date & time 07/15/15  1736 History   By signing my name below, I, Forrestine Him, attest that this documentation has been prepared under the direction and in the presence of Merryl Hacker, MD.  Electronically Signed: Forrestine Him, ED Scribe. 07/16/2015. 12:51 AM.   Chief Complaint  Patient presents with  . Fall   The history is provided by the patient. No language interpreter was used.    HPI Comments: [name] is a 82 y.o. female with a PMHx of HTN and stroke who presents to the Emergency Department here after a fall sustained earlier this afternoon. Pt states she was walking back inside after working in the yard for approximately 1 hour. She states she felt mildly dizzy-off balance prior to episode  and fell backwards. She now c/o constant, ongoing lower back pain along with a noted open wound to the R elbow. Discomfort is exacerbated with certain movements and when applying pressure to the back. No OTC medications or home remedies attempted prior to arrival. No fever, chills, nausea, vomiting, chest pain, or shortness of breath. Tetanus states unknown, however, she feels she is UTD. She is not currently on any anticoagulants.   PCP: [name], MD    Past Medical History  Diagnosis Date  . Hypertension   . Facial rash   . Seborrheic keratoses   . Microscopic hematuria     with negative workup   . Fibrocystic disease of breast   . Anxiety   . Compression fracture of L1 lumbar vertebra (HCC)   . Allergic rhinitis   . Depression     situational  . Arthritis     Left AC joint  . Complication of anesthesia     states allergy to a med used with pacemaker - made her wild  . Vision abnormalities   . Stroke ([place])   . Complete heart block (HCC)     a. s/p MDT dual chamber pacemaker  . GI bleed     a. while on ASA   Past Surgical History  Procedure Laterality Date  . Insert / replace / remove pacemaker    .  Esophagogastroduodenoscopy Left 03/22/2014    Procedure: ESOPHAGOGASTRODUODENOSCOPY (EGD);  Surgeon: Arta Silence, MD;  Location: [facility] ENDOSCOPY;  Service: Endoscopy;  Laterality: Left;  . Esophagogastroduodenoscopy [name] 12/20/2014    Procedure: ESOPHAGOGASTRODUODENOSCOPY (EGD);  Surgeon: Teena Irani, MD;  Location: [facility] ENDOSCOPY;  Service: Endoscopy;  Laterality: [name];  . Ep implantable device [name] 05/29/2015    Procedure: PPM/BIV PPM Generator Changeout;  Surgeon: Evans Lance, MD;  Location: [place] CV LAB;  Service: Cardiovascular;  Laterality: [name];   Family History  Problem Relation Age of Onset  . Diabetes Mother   . Stroke Mother   . Coronary artery disease Mother   . Cancer - Lung Father   . Hypertension Sister   . Hypertension Brother   . Diabetes Brother   . Cancer Brother   . Hypertension Brother   . Diabetes Brother   . Cancer Brother    Social History  Substance Use Topics  . Smoking status: Never Smoker   . Smokeless tobacco: None  . Alcohol Use: No   OB History    No data available     Review of Systems  Constitutional: Negative for fever and chills.  Respiratory: Negative for cough and shortness of breath.   Cardiovascular: Negative for chest pain.  Gastrointestinal: Negative for nausea, vomiting and  abdominal pain.  Musculoskeletal: Positive for back pain.  Skin: Positive for wound.  Neurological: Negative for headaches.  Psychiatric/Behavioral: Negative for confusion.  All other systems reviewed and are negative.     Allergies  Clindamycin/lincomycin; Antihistamines, chlorpheniramine-type; Ativan; Imiquimod; Micardis hct; Thiazide-type diuretics; and Ultracet  Home Medications   Prior to Admission medications   Medication Sig Start Date End Date Taking? Authorizing Provider  Calcium Carb-Cholecalciferol (CALCIUM 600+D3) 600-800 MG-UNIT TABS Take 1 tablet by mouth daily.   Yes Historical Provider, MD  carvedilol (COREG) 6.25 MG tablet Take 1 tablet  (6.25 mg total) by mouth 2 (two) times daily. 06/23/15  Yes Evans Lance, MD  cholecalciferol (VITAMIN D) 400 UNITS TABS tablet Take 2,000 Units by mouth daily.    Yes Historical Provider, MD  ferrous sulfate 325 (65 FE) MG tablet Take 325 mg by mouth daily with breakfast.   Yes Historical Provider, MD  flecainide (TAMBOCOR) 50 MG tablet Take 1 tablet (50 mg total) by mouth 2 (two) times daily. 07/11/15  Yes Evans Lance, MD  lidocaine (LIDODERM) 5 % Place 1 patch onto the skin daily. Place one patch on to skin as directed.  Remove & Discard patch within 12 hours or as directed by MD   Yes Historical Provider, MD  losartan (COZAAR) 25 MG tablet Take 25 mg by mouth daily.   Yes Historical Provider, MD  Omega-3 Fatty Acids (FISH OIL) 1200 MG CAPS Take 1,200 mg by mouth daily.   Yes Historical Provider, MD  omeprazole (PRILOSEC) 40 MG capsule Take 40 mg by mouth daily.   Yes Historical Provider, MD  traMADol (ULTRAM) 50 MG tablet Take 50 mg by mouth every 6 (six) hours as needed for moderate pain.    Yes Historical Provider, MD  triamterene-hydrochlorothiazide (MAXZIDE-25) 37.5-25 MG tablet Take 1 tablet by mouth daily. Take one-half tablet daily   Yes Historical Provider, MD  vitamin B-12 (CYANOCOBALAMIN) 500 MCG tablet Take 500 mcg by mouth daily.   Yes Historical Provider, MD  oxyCODONE-acetaminophen (PERCOCET/ROXICET) 5-325 MG tablet Take 1 tablet by mouth every 6 (six) hours as needed for severe pain. 07/16/15   Merryl Hacker, MD   Triage Vitals: BP 165/89 mmHg  Pulse 67  Temp(Src) 98.6 F (37 C) (Oral)  Resp 28  SpO2 95%   Physical Exam  Constitutional: She is oriented to person, place, and time. She appears well-developed and well-nourished.  Appears generally than stated age  HENT:  Head: Normocephalic and atraumatic.  Mouth/Throat: Oropharynx is clear and moist.  Eyes: EOM are normal. Pupils are equal, round, and reactive to light.  Neck: Normal range of motion. Neck supple.   Cardiovascular: Normal rate, regular rhythm and normal heart sounds.   No murmur heard. Pulmonary/Chest: Effort normal and breath sounds normal. No respiratory distress. She has no wheezes.  Abdominal: Soft. Bowel sounds are normal. There is no tenderness. There is no rebound.  Musculoskeletal: Normal range of motion.  Normal range of motion of bilateral hips and knees, normal range of motion of bilateral elbows, contusion noted to the right wrist and right elbow with associated laceration, tenderness to palpation lower lumbar spine with a small contusion noted just right of midline, there is also tenderness to palpation right mid buttock, no obvious contusion  Neurological: She is alert and oriented to person, place, and time.  Cranial nerves II through VII intact, no dysmetria to finger-nose-finger  Skin: Skin is warm and dry.  5 cm laceration just distal to  the right elbow, bleeding controlled  Psychiatric: She has a normal mood and affect.  Nursing note and vitals reviewed.   ED Course  Procedures (including critical care time)  DIAGNOSTIC STUDIES: Oxygen Saturation is 95% on RA, adequate by my interpretation.    COORDINATION OF CARE: 12:45 AM- Will order imaging, EKG, and blood work. Discussed treatment plan with pt at bedside and pt agreed to plan.     Labs Review Labs Reviewed  BASIC METABOLIC PANEL - Abnormal; Notable for the following:    Glucose, Bld 102 (*)    BUN 26 (*)    Creatinine, Ser 1.03 (*)    Calcium 11.0 (*)    GFR calc non Af Amer 47 (*)    GFR calc Af Amer 55 (*)    All other components within normal limits  CBC - Abnormal; Notable for the following:    Platelets 127 (*)    All other components within normal limits  URINALYSIS, ROUTINE W REFLEX MICROSCOPIC (NOT AT [facility])  I-STAT TROPOININ, ED  CBG MONITORING, ED    Imaging Review Dg Lumbar Spine Complete  07/16/2015  CLINICAL DATA:  [place] tonight onto the kitchen floor, landing on the tail bone. Pain  low back and coccyx. EXAM: LUMBAR SPINE - COMPLETE 4+ VIEW; SACRUM AND COCCYX - 2+ VIEW COMPARISON:  Two-view chest 12/20/2014. FINDINGS: Lumbar spine: Diffuse bone demineralization. Degenerative changes throughout the lumbar spine with narrowed lumbar interspaces and endplate hypertrophic changes throughout. Bridging anterior osteophytes in the lower thoracic spine. Degenerative disc disease at L3-4, L4-5, L5-S1 levels. Compression fracture of the L1 vertebra is unchanged since previous chest radiograph. Mild anterior subluxation of L5 on S1, likely degenerative. No acute vertebral compression deformities are demonstrated. Vascular calcifications in the aorta. Sacrum: Visualization of the sacrum is limited due to overlying stool. Normal alignment of the sacral spine. No focal bone lesion or bone destruction. SI joints are symmetrical. Sacral struts appear intact. IMPRESSION: Diffuse bone demineralization. No acute fractures demonstrated in the lumbar spine or sacrum. Chronic degenerative changes. Old compression of L1. Chronic appearing anterior subluxation of L5 on the sacrum. Electronically Signed   By: Lucienne Capers M.D.   On: 07/16/2015 02:21   Dg Sacrum/coccyx  07/16/2015  CLINICAL DATA:  [place] tonight onto the kitchen floor, landing on the tail bone. Pain low back and coccyx. EXAM: LUMBAR SPINE - COMPLETE 4+ VIEW; SACRUM AND COCCYX - 2+ VIEW COMPARISON:  Two-view chest 12/20/2014. FINDINGS: Lumbar spine: Diffuse bone demineralization. Degenerative changes throughout the lumbar spine with narrowed lumbar interspaces and endplate hypertrophic changes throughout. Bridging anterior osteophytes in the lower thoracic spine. Degenerative disc disease at L3-4, L4-5, L5-S1 levels. Compression fracture of the L1 vertebra is unchanged since previous chest radiograph. Mild anterior subluxation of L5 on S1, likely degenerative. No acute vertebral compression deformities are demonstrated. Vascular calcifications in  the aorta. Sacrum: Visualization of the sacrum is limited due to overlying stool. Normal alignment of the sacral spine. No focal bone lesion or bone destruction. SI joints are symmetrical. Sacral struts appear intact. IMPRESSION: Diffuse bone demineralization. No acute fractures demonstrated in the lumbar spine or sacrum. Chronic degenerative changes. Old compression of L1. Chronic appearing anterior subluxation of L5 on the sacrum. Electronically Signed   By: Lucienne Capers M.D.   On: 07/16/2015 02:21   Dg Elbow Complete Right  07/15/2015  CLINICAL DATA:  Dizziness after working in the yard, falling backwards with loss of consciousness. Pain in abrasion to the posterior elbow.  Initial encounter. EXAM: RIGHT ELBOW - COMPLETE 3+ VIEW COMPARISON:  None. FINDINGS: No acute fracture, dislocation, or definite elbow joint effusion is identified. There is slight spurring of the coronoid process of the ulna. There is also a small enthesophyte posteriorly at the olecranon. No destructive osseous lesion or radiopaque foreign body is seen. Soft tissue irregularity and lucency at the posterior aspect of the elbow overlying the olecranon is consistent with reported soft tissue injury. IMPRESSION: Soft tissue injury without acute osseous abnormality identified. Electronically Signed   By: Logan Bores M.D.   On: 07/15/2015 19:07   Ct Head Wo Contrast  07/15/2015  CLINICAL DATA:  Status post fall today with a blow to the back of the head. Initial encounter. EXAM: CT HEAD WITHOUT CONTRAST CT CERVICAL SPINE WITHOUT CONTRAST TECHNIQUE: Multidetector CT imaging of the head and cervical spine was performed following the standard protocol without intravenous contrast. Multiplanar CT image reconstructions of the cervical spine were also generated. COMPARISON:  Head CT scan 12/22/2014. FINDINGS: CT HEAD FINDINGS There is cortical atrophy and chronic microvascular ischemic change. Remote left PCA territory infarct is noted. No  evidence of acute abnormality including hemorrhage, infarct, mass lesion, mass effect, midline shift or abnormal extra-axial fluid collection is seen. No hydrocephalus or pneumocephalus. The calvarium is intact. There is opacification of some left ethmoid air cells, unchanged. CT CERVICAL SPINE FINDINGS No acute fracture or malalignment is identified. Defect in the posterior arch of C1 on the right is chronic and may be due to old trauma. Intervertebral disc space height is maintained lung apices demonstrate some calcified pleural plaques on the right but are otherwise unremarkable. IMPRESSION: No acute abnormality head or cervical spine. Atrophy, chronic microvascular ischemic change and remote left PCA territory infarct. Electronically Signed   By: Inge Rise M.D.   On: 07/15/2015 19:32   Ct Cervical Spine Wo Contrast  07/15/2015  CLINICAL DATA:  Status post fall today with a blow to the back of the head. Initial encounter. EXAM: CT HEAD WITHOUT CONTRAST CT CERVICAL SPINE WITHOUT CONTRAST TECHNIQUE: Multidetector CT imaging of the head and cervical spine was performed following the standard protocol without intravenous contrast. Multiplanar CT image reconstructions of the cervical spine were also generated. COMPARISON:  Head CT scan 12/22/2014. FINDINGS: CT HEAD FINDINGS There is cortical atrophy and chronic microvascular ischemic change. Remote left PCA territory infarct is noted. No evidence of acute abnormality including hemorrhage, infarct, mass lesion, mass effect, midline shift or abnormal extra-axial fluid collection is seen. No hydrocephalus or pneumocephalus. The calvarium is intact. There is opacification of some left ethmoid air cells, unchanged. CT CERVICAL SPINE FINDINGS No acute fracture or malalignment is identified. Defect in the posterior arch of C1 on the right is chronic and may be due to old trauma. Intervertebral disc space height is maintained lung apices demonstrate some calcified  pleural plaques on the right but are otherwise unremarkable. IMPRESSION: No acute abnormality head or cervical spine. Atrophy, chronic microvascular ischemic change and remote left PCA territory infarct. Electronically Signed   By: Inge Rise M.D.   On: 07/15/2015 19:32   Dg Hip Unilat  With Pelvis 2-3 Views Right  07/15/2015  CLINICAL DATA:  Status post fall today. Right hip pain. Initial encounter. EXAM: DG HIP (WITH OR WITHOUT PELVIS) 2-3V RIGHT COMPARISON:  None. FINDINGS: No acute bony or joint abnormalities seen on the right or left. The patient has right worse than left hip osteoarthritis. Bones are osteopenic. Chondrocalcinosis about the right  hip is noted. IMPRESSION: No acute abnormality. Osteopenia. Electronically Signed   By: Inge Rise M.D.   On: 07/15/2015 19:06   I have personally reviewed and evaluated these images and lab results as part of my medical decision-making.   EKG Interpretation   Date/Time:  Tuesday July 15 2015 18:30:32 EDT Ventricular Rate:  69 PR Interval:    QRS Duration: 170 QT Interval:  470 QTC Calculation: 503 R Axis:   -125 Text Interpretation:  AV dual-paced rhythm with occasional Premature  ventricular complexes Abnormal ECG Confirmed by HORTON  MD, Loma Sousa  [id number]) on 07/16/2015 12:41:03 AM      MDM   Final diagnoses:  Fall, initial encounter  Elbow laceration, right, initial encounter    Patient presents following a fall. She is nontoxic on exam. She has some contusions noted as well as a laceration to the right elbow.  Patient reports dizziness. Reports that she now feels improved. She is nontoxic and nonfocal. Vital signs reassuring. Injuries as noted above. Lab work and CT scans as well as x-rays reviewed from triage and largely unremarkable. Patient's AICD was interrogated without event. Laceration was repaired the bedside by PA. No obvious fractures on x-rays. Patient was able to ambulate at her baseline. Tetanus was updated.  Follow-up with primary physician for suture removal in 7-10 days. Patient did not provide a urine sample on the ER. However, she sat in the waiting room for greater than 6 hours and at time of discharge did not want to wait for urine. Feel this is reasonable.  After history, exam, and medical workup I feel the patient has been appropriately medically screened and is safe for discharge home. Pertinent diagnoses were discussed with the patient. Patient was given return precautions.  I personally performed the services described in this documentation, which was scribed in my presence. The recorded information has been reviewed and is accurate.   Merryl Hacker, MD 07/16/15 [phone]

## 2015-07-16 NOTE — Discharge Instructions (Signed)
You were seen today after a fall. All your x-rays are reassuring. You did have a laceration to the right elbow. This was closed. You need to have sutures removed in 7-10 days.  Laceration Care, Adult A laceration is a cut that goes through all of the layers of the skin and into the tissue that is right under the skin. Some lacerations heal on their own. Others need to be closed with stitches (sutures), staples, skin adhesive strips, or skin glue. Proper laceration care minimizes the risk of infection and helps the laceration to heal better. HOW TO CARE FOR YOUR LACERATION If sutures or staples were used:  Keep the wound clean and dry.  If you were given a bandage (dressing), you should change it at least one time per day or as told by your health care provider. You should also change it if it becomes wet or dirty.  Keep the wound completely dry for the first 24 hours or as told by your health care provider. After that time, you may shower or bathe. However, make sure that the wound is not soaked in water until after the sutures or staples have been removed.  Clean the wound one time each day or as told by your health care provider:  Wash the wound with soap and water.  Rinse the wound with water to remove all soap.  Pat the wound dry with a clean towel. Do not rub the wound.  After cleaning the wound, apply a thin layer of antibiotic ointmentas told by your health care provider. This will help to prevent infection and keep the dressing from sticking to the wound.  Have the sutures or staples removed as told by your health care provider. If skin adhesive strips were used:  Keep the wound clean and dry.  If you were given a bandage (dressing), you should change it at least one time per day or as told by your health care provider. You should also change it if it becomes dirty or wet.  Do not get the skin adhesive strips wet. You may shower or bathe, but be careful to keep the wound  dry.  If the wound gets wet, pat it dry with a clean towel. Do not rub the wound.  Skin adhesive strips fall off on their own. You may trim the strips as the wound heals. Do not remove skin adhesive strips that are still stuck to the wound. They will fall off in time. If skin glue was used:  Try to keep the wound dry, but you may briefly wet it in the shower or bath. Do not soak the wound in water, such as by swimming.  After you have showered or bathed, gently pat the wound dry with a clean towel. Do not rub the wound.  Do not do any activities that will make you sweat heavily until the skin glue has fallen off on its own.  Do not apply liquid, cream, or ointment medicine to the wound while the skin glue is in place. Using those may loosen the film before the wound has healed.  If you were given a bandage (dressing), you should change it at least one time per day or as told by your health care provider. You should also change it if it becomes dirty or wet.  If a dressing is placed over the wound, be careful not to apply tape directly over the skin glue. Doing that may cause the glue to be pulled off before the  wound has healed.  Do not pick at the glue. The skin glue usually remains in place for 5-10 days, then it falls off of the skin. General Instructions  Take over-the-counter and prescription medicines only as told by your health care provider.  If you were prescribed an antibiotic medicine or ointment, take or apply it as told by your doctor. Do not stop using it even if your condition improves.  To help prevent scarring, make sure to cover your wound with sunscreen whenever you are outside after stitches are removed, after adhesive strips are removed, or when glue remains in place and the wound is healed. Make sure to wear a sunscreen of at least 30 SPF.  Do not scratch or pick at the wound.  Keep all follow-up visits as told by your health care provider. This is  important.  Check your wound every day for signs of infection. Watch for:  Redness, swelling, or pain.  Fluid, blood, or pus.  Raise (elevate) the injured area above the level of your heart while you are sitting or lying down, if possible. SEEK MEDICAL CARE IF:  You received a tetanus shot and you have swelling, severe pain, redness, or bleeding at the injection site.  You have a fever.  A wound that was closed breaks open.  You notice a bad smell coming from your wound or your dressing.  You notice something coming out of the wound, such as wood or glass.  Your pain is not controlled with medicine.  You have increased redness, swelling, or pain at the site of your wound.  You have fluid, blood, or pus coming from your wound.  You notice a change in the color of your skin near your wound.  You need to change the dressing frequently due to fluid, blood, or pus draining from the wound.  You develop a new rash.  You develop numbness around the wound. SEEK IMMEDIATE MEDICAL CARE IF:  You develop severe swelling around the wound.  Your pain suddenly increases and is severe.  You develop painful lumps near the wound or on skin that is anywhere on your body.  You have a red streak going away from your wound.  The wound is on your hand or foot and you cannot properly move a finger or toe.  The wound is on your hand or foot and you notice that your fingers or toes look pale or bluish.   This information is not intended to replace advice given to you by your health care provider. Make sure you discuss any questions you have with your health care provider.   Document Released: 04/12/2005 Document Revised: 08/27/2014 Document Reviewed: 04/08/2014 Elsevier Interactive Patient Education 2016 [place] Prevention in the Home  Falls can cause injuries and can affect people from all age groups. There are many simple things that you can do to make your home safe and to  help prevent falls. WHAT CAN I DO ON THE OUTSIDE OF MY HOME?  Regularly repair the edges of walkways and driveways and fix any cracks.  Remove high doorway thresholds.  Trim any shrubbery on the main path into your home.  Use bright outdoor lighting.  Clear walkways of debris and clutter, including tools and rocks.  Regularly check that handrails are securely fastened and in good repair. Both sides of any steps should have handrails.  Install guardrails along the edges of any raised decks or porches.  Have leaves, snow, and ice cleared regularly.  Use sand or salt on walkways during winter months.  In the garage, clean up any spills right away, including grease or oil spills. WHAT CAN I DO IN THE BATHROOM?  Use night lights.  Install grab bars by the toilet and in the tub and shower. Do not use towel bars as grab bars.  Use non-skid mats or decals on the floor of the tub or shower.  If you need to sit down while you are in the shower, use a plastic, non-slip stool.Marland Kitchen  Keep the floor dry. Immediately clean up any water that spills on the floor.  Remove soap buildup in the tub or shower on a regular basis.  Attach bath mats securely with double-sided non-slip rug tape.  Remove throw rugs and other tripping hazards from the floor. WHAT CAN I DO IN THE BEDROOM?  Use night lights.  Make sure that a bedside light is easy to reach.  Do not use oversized bedding that drapes onto the floor.  Have a firm chair that has side arms to use for getting dressed.  Remove throw rugs and other tripping hazards from the floor. WHAT CAN I DO IN THE KITCHEN?   Clean up any spills right away.  Avoid walking on wet floors.  Place frequently used items in easy-to-reach places.  If you need to reach for something above you, use a sturdy step stool that has a grab bar.  Keep electrical cables out of the way.  Do not use floor polish or wax that makes floors slippery. If you have to  use wax, make sure that it is non-skid floor wax.  Remove throw rugs and other tripping hazards from the floor. WHAT CAN I DO IN THE STAIRWAYS?  Do not leave any items on the stairs.  Make sure that there are handrails on both sides of the stairs. Fix handrails that are broken or loose. Make sure that handrails are as long as the stairways.  Check any carpeting to make sure that it is firmly attached to the stairs. Fix any carpet that is loose or worn.  Avoid having throw rugs at the top or bottom of stairways, or secure the rugs with carpet tape to prevent them from moving.  Make sure that you have a light switch at the top of the stairs and the bottom of the stairs. If you do not have them, have them installed. WHAT ARE SOME OTHER FALL PREVENTION TIPS?  Wear closed-toe shoes that fit well and support your feet. Wear shoes that have rubber soles or low heels.  When you use a stepladder, make sure that it is completely opened and that the sides are firmly locked. Have someone hold the ladder while you are using it. Do not climb a closed stepladder.  Add color or contrast paint or tape to grab bars and handrails in your home. Place contrasting color strips on the first and last steps.  Use mobility aids as needed, such as canes, walkers, scooters, and crutches.  Turn on lights if it is dark. Replace any light bulbs that burn out.  Set up furniture so that there are clear paths. Keep the furniture in the same spot.  Fix any uneven floor surfaces.  Choose a carpet design that does not hide the edge of steps of a stairway.  Be aware of any and all pets.  Review your medicines with your healthcare provider. Some medicines can cause dizziness or changes in blood pressure, which increase your risk of  falling. Talk with your health care provider about other ways that you can decrease your risk of falls. This may include working with a physical therapist or trainer to improve your strength,  balance, and endurance.   This information is not intended to replace advice given to you by your health care provider. Make sure you discuss any questions you have with your health care provider.   Document Released: 04/02/2002 Document Revised: 08/27/2014 Document Reviewed: 05/17/2014 Elsevier Interactive Patient Education [employer].

## 2015-07-16 NOTE — ED Provider Notes (Signed)
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   [place] Performed by: Lorayne Bender Authorized by: Lorayne Bender Consent: Verbal consent obtained. Risks and benefits: risks, benefits and alternatives were discussed Consent given by: patient Patient identity confirmed: provided demographic data Prepped and Draped in normal sterile fashion Wound explored  Laceration Location: Right posterior elbow  Laceration Length: 5 cm  No Foreign Bodies seen or palpated  Anesthesia: local infiltration  Local anesthetic: lidocaine 2% 1:200000 epinephrine  Anesthetic total: 4 ml  Irrigation method: syringe Amount of cleaning: standard  Skin closure: Close approximation    Number of sutures: 5 deep dermal (4-0 Vicryl); 5 horizontal mattress (3-0 Prolene)  Technique: Complex  Patient tolerance: Patient tolerated the procedure well with no immediate complications.  Lorayne Bender, PA-C 07/16/15 0151  Merryl Hacker, MD 07/16/15 [phone]

## 2015-07-16 NOTE — ED Notes (Signed)
Pt left with all her belongings and was wheeled out of the treatment area with her family.

## 2015-07-25 ENCOUNTER — Encounter: Payer: Self-pay | Admitting: Internal Medicine

## 2015-07-25 ENCOUNTER — Ambulatory Visit (INDEPENDENT_AMBULATORY_CARE_PROVIDER_SITE_OTHER): Payer: Medicare Other | Admitting: Internal Medicine

## 2015-07-25 VITALS — BP 128/78 | HR 80 | Ht 64.0 in | Wt 130.4 lb

## 2015-07-25 DIAGNOSIS — I493 Ventricular premature depolarization: Secondary | ICD-10-CM | POA: Diagnosis not present

## 2015-07-25 DIAGNOSIS — M545 Low back pain: Secondary | ICD-10-CM | POA: Diagnosis not present

## 2015-07-25 DIAGNOSIS — I63432 Cerebral infarction due to embolism of left posterior cerebral artery: Secondary | ICD-10-CM | POA: Diagnosis not present

## 2015-07-25 DIAGNOSIS — Z4802 Encounter for removal of sutures: Secondary | ICD-10-CM | POA: Diagnosis not present

## 2015-07-25 LAB — CUP PACEART INCLINIC DEVICE CHECK
Brady Statistic AP VP Percent: 47 %
Brady Statistic AP VS Percent: 0 %
Brady Statistic AS VP Percent: 49 %
Brady Statistic AS VS Percent: 4 %
Date Time Interrogation Session: 20170331152119
Implantable Lead Implant Date: 20080204
Implantable Lead Location: 753860
Implantable Lead Model: 5076
Lead Channel Impedance Value: 534 Ohm
Lead Channel Pacing Threshold Amplitude: 0.75 V
Lead Channel Pacing Threshold Amplitude: 0.75 V
Lead Channel Pacing Threshold Pulse Width: 0.4 ms
Lead Channel Pacing Threshold Pulse Width: 0.4 ms
Lead Channel Sensing Intrinsic Amplitude: 2.8 mV
Lead Channel Setting Sensing Sensitivity: 5.6 mV
MDC IDC LEAD IMPLANT DT: 20080204
MDC IDC LEAD LOCATION: 753859
MDC IDC MSMT BATTERY IMPEDANCE: 100 Ohm
MDC IDC MSMT BATTERY REMAINING LONGEVITY: 134 mo
MDC IDC MSMT BATTERY VOLTAGE: 2.79 V
MDC IDC MSMT LEADCHNL RA IMPEDANCE VALUE: 529 Ohm
MDC IDC SET LEADCHNL RA PACING AMPLITUDE: 2 V
MDC IDC SET LEADCHNL RV PACING AMPLITUDE: 2.5 V
MDC IDC SET LEADCHNL RV PACING PULSEWIDTH: 0.4 ms

## 2015-07-25 MED ORDER — FLECAINIDE ACETATE 50 MG PO TABS
50.0000 mg | ORAL_TABLET | Freq: Two times a day (BID) | ORAL | Status: DC
Start: 2015-07-25 — End: 2015-08-15

## 2015-07-25 MED ORDER — CARVEDILOL 6.25 MG PO TABS: 6.2500 mg | ORAL_TABLET | Freq: Two times a day (BID) | ORAL | Status: DC

## 2015-07-25 NOTE — Progress Notes (Signed)
HPI [name] returns today for ongoing evaluation and management of her palpitations. She is a pleasant 82 yo woman, with a h/o CHB, s/p PPM insertion and HTN. The patient denies anginal symptoms. No sob or syncope. Minimal peripheral edema. In the interim, she has been stable. She has begun to slow down however and admits to being not as active.  Allergies  Allergen Reactions  . Clindamycin/Lincomycin Rash  . Antihistamines, Chlorpheniramine-Type     nightmares  . Ativan [Lorazepam]     Possible: agitation  . Imiquimod   . Micardis Hct [Telmisartan-Hctz]     fatigue  . Thiazide-Type Diuretics     Possible photosensitivity  . Ultracet [Tramadol-Acetaminophen]     nightmares     Current Outpatient Prescriptions  Medication Sig Dispense Refill  . Calcium Carb-Cholecalciferol (CALCIUM 600+D3) 600-800 MG-UNIT TABS Take 1 tablet by mouth daily.    . carvedilol (COREG) 6.25 MG tablet Take 1 tablet (6.25 mg total) by mouth 2 (two) times daily. 180 tablet 3  . cholecalciferol (VITAMIN D) 400 UNITS TABS tablet Take 2,000 Units by mouth daily.     . ferrous sulfate 325 (65 FE) MG tablet Take 325 mg by mouth daily with breakfast.    . flecainide (TAMBOCOR) 50 MG tablet Take 1 tablet (50 mg total) by mouth 2 (two) times daily. 180 tablet 3  . lidocaine (LIDODERM) 5 % Place 1 patch onto the skin daily. Place one patch on to skin as directed.  Remove & Discard patch within 12 hours or as directed by MD    . losartan (COZAAR) 25 MG tablet Take 25 mg by mouth daily.    . Omega-3 Fatty Acids (FISH OIL) 1200 MG CAPS Take 1,200 mg by mouth daily.    Marland Kitchen omeprazole (PRILOSEC) 40 MG capsule Take 40 mg by mouth daily.    Marland Kitchen oxyCODONE-acetaminophen (PERCOCET/ROXICET) 5-325 MG tablet Take 1 tablet by mouth every 6 (six) hours as needed for severe pain. 10 tablet 0  . traMADol (ULTRAM) 50 MG tablet Take 50 mg by mouth every 6 (six) hours as needed for moderate pain.     Marland Kitchen  triamterene-hydrochlorothiazide (MAXZIDE-25) 37.5-25 MG tablet Take 1 tablet by mouth daily. Take one-half tablet daily    . vitamin B-12 (CYANOCOBALAMIN) 500 MCG tablet Take 500 mcg by mouth daily.     No current facility-administered medications for this visit.     Past Medical History  Diagnosis Date  . Hypertension   . Facial rash   . Seborrheic keratoses   . Microscopic hematuria     with negative workup   . Fibrocystic disease of breast   . Anxiety   . Compression fracture of L1 lumbar vertebra (HCC)   . Allergic rhinitis   . Depression     situational  . Arthritis     Left AC joint  . Complication of anesthesia     states allergy to a med used with pacemaker - made her wild  . Vision abnormalities   . Stroke ([place])   . Complete heart block (HCC)     a. s/p MDT dual chamber pacemaker  . GI bleed     a. while on ASA    ROS:   All systems reviewed and negative except as noted in the HPI.   Past Surgical History  Procedure Laterality Date  . Insert / replace / remove pacemaker    . Esophagogastroduodenoscopy Left 03/22/2014    Procedure: ESOPHAGOGASTRODUODENOSCOPY (EGD);  Surgeon: Arta Silence, MD;  Location: [facility] ENDOSCOPY;  Service: Endoscopy;  Laterality: Left;  . Esophagogastroduodenoscopy [name] 12/20/2014    Procedure: ESOPHAGOGASTRODUODENOSCOPY (EGD);  Surgeon: Teena Irani, MD;  Location[facility] ENDOSCOPY;  Service: Endoscopy;  Laterality: [name];  . Ep implantable device [name] 05/29/2015    Procedure: PPM/BIV PPM Generator Changeout;  Surgeon: Evans Lance, MD;  Location: [facility] LAB;  Service: Cardiovascular;  Laterality: [name];     Family History  Problem Relation Age of Onset  . Diabetes Mother   . Stroke Mother   . Coronary artery disease Mother   . Cancer - Lung Father   . Hypertension Sister   . Hypertension Brother   . Diabetes Brother   . Cancer Brother   . Hypertension Brother   . Diabetes Brother   . Cancer Brother      Social History    Social History  . Marital Status: Widowed    Spouse Name: [name]  . Number of Children: [name]  . Years of Education: [name]   Occupational History  . Not on file.   Social History Main Topics  . Smoking status: Never Smoker   . Smokeless tobacco: Not on file  . Alcohol Use: No  . Drug Use: No  . Sexual Activity: Not on file   Other Topics Concern  . Not on file   Social History Narrative     BP 128/78 mmHg  Pulse 80  Ht 5\' 4"  (1.626 m)  Wt 130 lb 6.4 oz (59.149 kg)  BMI 22.37 kg/m2  Physical Exam:  stable appearing elderly woman, NAD HEENT: Unremarkable Neck:  6 cm JVD, no thyromegally Back:  No CVA tenderness Lungs:  Clear with no wheezes, rales, or rhonchi HEART:  Regular rate rhythm, no murmurs, no rubs, no clicks Abd:  soft, positive bowel sounds, no organomegally, no rebound, no guarding Ext:  2 plus pulses, no edema, no cyanosis, no clubbing Skin:  No rashes no nodules Neuro:  CN II through XII intact, motor grossly intact  ECG - NSR with AV pacing. Since prior tracing, PVC's have resolved.  DEVICE  Normal device function.  See PaceArt for details.   Assess/Plan: 1. PVC's - these are much improved since starting flecainide. Will follow.  2. CHB - she is s/p PPM and stable. No escape 3. PPM - Her medtronic DDD PM is working normally. Will recheck in several months. 4. HTN - her blood pressure is improved on coreg. No change in dose.  Mikle Bosworth.D.

## 2015-07-25 NOTE — Patient Instructions (Signed)
Medication Instructions:  Your physician recommends that you continue on your current medications as directed. Please refer to the Current Medication list given to you today.   Labwork: None ordered   Testing/Procedures: None ordered   Follow-Up: Your physician wants you to follow-up in: 12 months with Dr Knox Saliva will receive a reminder letter in the mail two months in advance. If you don't receive a letter, please call our office to schedule the follow-up appointment.  Remote monitoring is used to monitor your Pacemaker  from home. This monitoring reduces the number of office visits required to check your device to one time per year. It allows [place] to keep an eye on the functioning of your device to ensure it is working properly. You are scheduled for a device check from home on 10/27/15. You may send your transmission at any time that day. If you have a wireless device, the transmission will be sent automatically. After your physician reviews your transmission, you will receive a postcard with your next transmission date.     Any Other Special Instructions Will Be Listed Below (If Applicable).     If you need a refill on your cardiac medications before your next appointment, please call your pharmacy.

## 2015-07-31 ENCOUNTER — Other Ambulatory Visit: Payer: Self-pay

## 2015-07-31 DIAGNOSIS — I493 Ventricular premature depolarization: Secondary | ICD-10-CM

## 2015-07-31 NOTE — Telephone Encounter (Signed)
Medication Detail      Disp Refills Start End     carvedilol (COREG) 6.25 MG tablet 180 tablet 3 07/25/2015     Sig - Route: Take 1 tablet (6.25 mg total) by mouth 2 (two) times daily. - Oral    E-Prescribing Status: Receipt confirmed by pharmacy (07/25/2015 12:54 PM EDT)     Associated Diagnoses    PVC's (premature ventricular contractions) - [place], [place] - [address]     Medication Detail      Disp Refills Start End     flecainide (TAMBOCOR) 50 MG tablet 180 tablet 3 07/25/2015     Sig - Route: Take 1 tablet (50 mg total) by mouth 2 (two) times daily. - Oral    E-Prescribing Status: Receipt confirmed by pharmacy (07/25/2015 12:54 PM EDT)     Associated Diagnoses    PVC's (premature ventricular contractions) - [place], [place] - [address]

## 2015-08-05 DIAGNOSIS — I639 Cerebral infarction, unspecified: Secondary | ICD-10-CM | POA: Diagnosis not present

## 2015-08-05 DIAGNOSIS — H353122 Nonexudative age-related macular degeneration, left eye, intermediate dry stage: Secondary | ICD-10-CM | POA: Diagnosis not present

## 2015-08-05 DIAGNOSIS — H353112 Nonexudative age-related macular degeneration, right eye, intermediate dry stage: Secondary | ICD-10-CM | POA: Diagnosis not present

## 2015-08-06 ENCOUNTER — Other Ambulatory Visit: Payer: Self-pay | Admitting: *Deleted

## 2015-08-06 DIAGNOSIS — I493 Ventricular premature depolarization: Secondary | ICD-10-CM

## 2015-08-12 IMAGING — CR DG HIP (WITH OR WITHOUT PELVIS) 2-3V*L*
2 series · 2 of 2 positions shown · non-contrast
Comparison: None.

CLINICAL DATA: Fall 3 months ago.  Hip pain

EXAM:
LEFT HIP - COMPLETE 2+ VIEW

[t hip ap left]
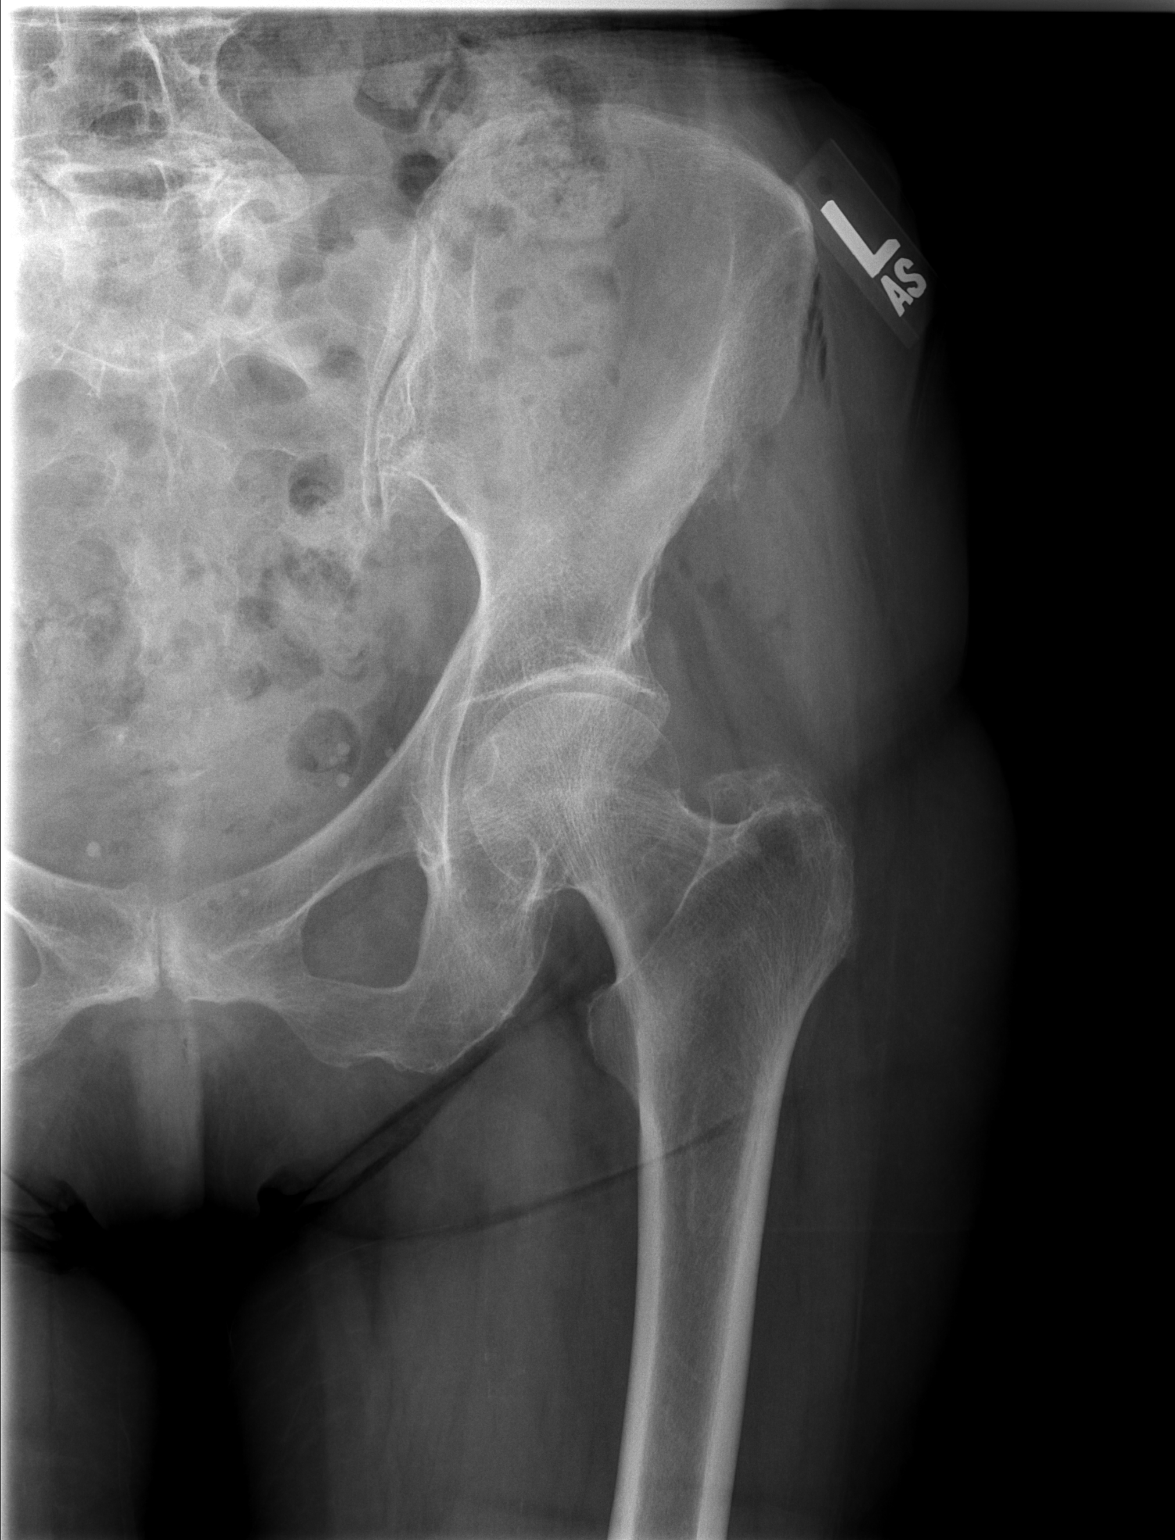

[t hip frog leg left]
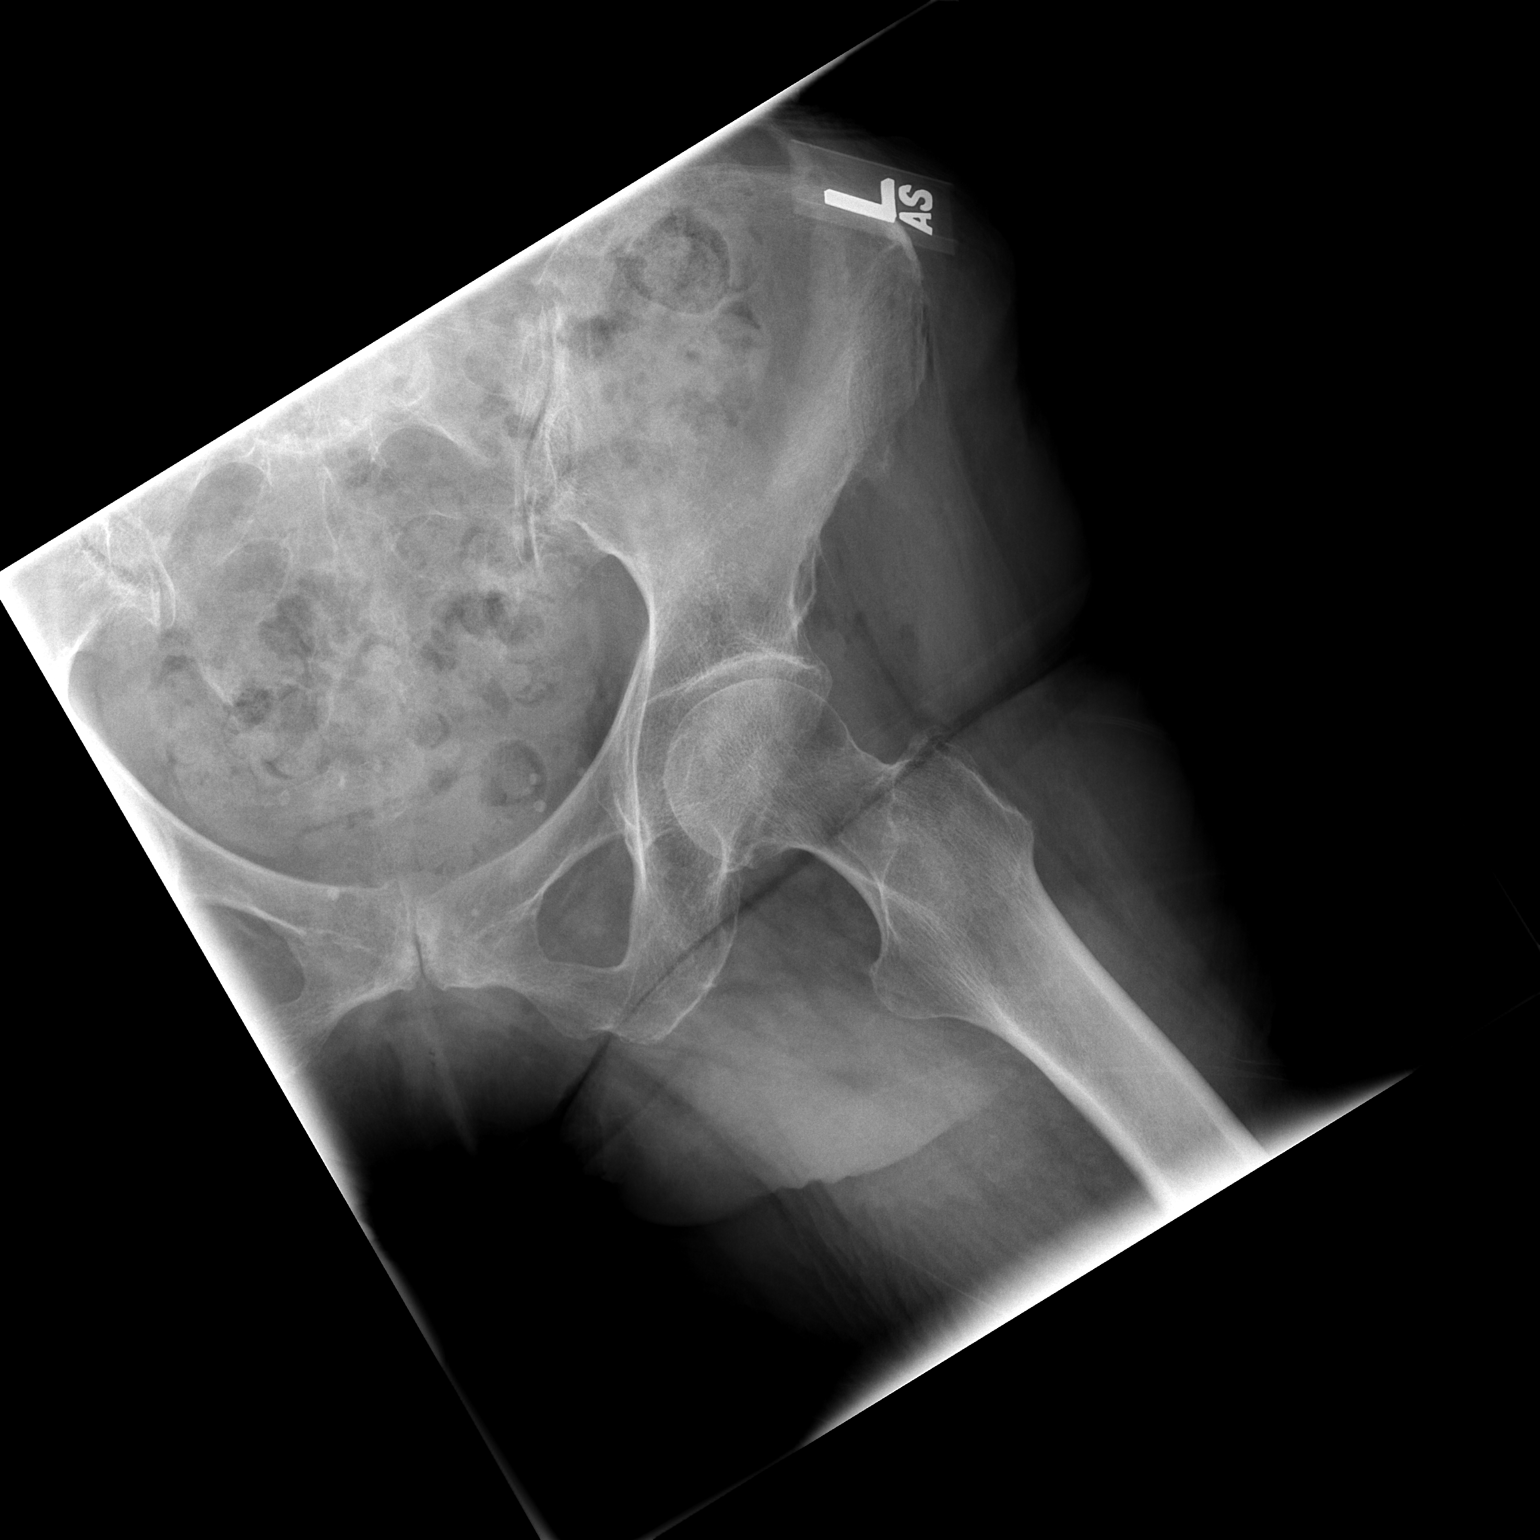

[2 of 2 positions shown; findings below may reference images not displayed]

FINDINGS: There is no evidence of hip fracture or dislocation. There is no
evidence of arthropathy or other focal bone abnormality.
IMPRESSION: Negative.

## 2015-08-13 ENCOUNTER — Other Ambulatory Visit: Payer: Self-pay

## 2015-08-15 ENCOUNTER — Other Ambulatory Visit: Payer: Self-pay | Admitting: *Deleted

## 2015-08-15 DIAGNOSIS — I493 Ventricular premature depolarization: Secondary | ICD-10-CM

## 2015-08-15 MED ORDER — FLECAINIDE ACETATE 50 MG PO TABS
50.0000 mg | ORAL_TABLET | Freq: Two times a day (BID) | ORAL | Status: DC
Start: 2015-08-15 — End: 2016-04-27

## 2015-08-15 MED ORDER — CARVEDILOL 6.25 MG PO TABS: 6.2500 mg | ORAL_TABLET | Freq: Two times a day (BID) | ORAL | Status: DC

## 2015-09-05 ENCOUNTER — Encounter: Payer: Medicare Other | Admitting: Internal Medicine

## 2015-09-23 ENCOUNTER — Ambulatory Visit (INDEPENDENT_AMBULATORY_CARE_PROVIDER_SITE_OTHER): Payer: Medicare Other | Admitting: Neurology

## 2015-09-23 ENCOUNTER — Encounter: Payer: Self-pay | Admitting: Neurology

## 2015-09-23 VITALS — BP 123/66 | HR 60 | Ht 64.0 in | Wt 129.0 lb

## 2015-09-23 DIAGNOSIS — I639 Cerebral infarction, unspecified: Secondary | ICD-10-CM

## 2015-09-23 DIAGNOSIS — I10 Essential (primary) hypertension: Secondary | ICD-10-CM

## 2015-09-23 DIAGNOSIS — K922 Gastrointestinal hemorrhage, unspecified: Secondary | ICD-10-CM | POA: Diagnosis not present

## 2015-09-23 DIAGNOSIS — K254 Chronic or unspecified gastric ulcer with hemorrhage: Secondary | ICD-10-CM | POA: Diagnosis not present

## 2015-09-23 DIAGNOSIS — Z950 Presence of cardiac pacemaker: Secondary | ICD-10-CM | POA: Diagnosis not present

## 2015-09-23 NOTE — Patient Instructions (Addendum)
-   will check CBC and BMP to see if we can start ASA or plavix for your stroke prevention - will continue pacemaker interrogation to see if there is afib episode, if so, we need to talk with GI regarding stronger blood thinners. - check BP at home and record - Follow up with your primary care physician for stroke risk factor modification. Recommend maintain blood pressure goal <130/80, diabetes with hemoglobin A1c goal below 6.5% and lipids with LDL cholesterol goal below 70 mg/dL.  - follow up with GI and cardiology  - follow up in 6 months.

## 2015-09-23 NOTE — Progress Notes (Signed)
STROKE NEUROLOGY FOLLOW UP NOTE  NAME: [name] DOB: 25-Jul-1924  REASON FOR VISIT: stroke follow up HISTORY FROM: daughter and chart  Today we had the pleasure of seeing [name] in follow-up at our Neurology Clinic. [name] was accompanied by daughter.   History Summary [name] is a 82 y.o. female with history of HTN, CAD, heart block s/p pacer, hx of GIB admitted on 12/20/14 for new GIB. [name] was admitted 02/2014 for duodenal ulcer. [name] still on ASA at home prior to this admission as per RN. [name] was found to have large gastric ulcer this time. However, developed "aphasia" and "left facial droop" but quickly resolved. CT negative as well as CTA head and neck. However, [name] stated that [name] has vision changes even sicne and was found to have right hemianopia. CT repeat showed left PCA infarct. Other stroke work up all negative including 2D echo, LDL and A1C. Her stroke was suspicious for embolic pattern. However, as [name] is not anticoagulation candidate, no embolic work up was performed.    02/11/15 follow up - the patient has been doing well. Right hemianopia improved and followed up with ophthal showed only right lower quadrantanopsia. [name] follows with GI and will do EGD again on 02/14/15. Currently still not cleared for antiplatelet yet. [name] denies any heart palpitatoin. BP 141/69 today.   05/21/15 follow up - the [name] has been doing well. No recurrent stroke like symptoms. [name] follows with [facility] Dr. [name] for gastric ulcer. As per daughter, [name] had last EGD done in 03/2015 and was told heals well. However, [name] is still not on any antiplatelet or anticoagulation for her stroke prevention. Will need to obtain further info about her gastric ulcer status. Otherwise, [name] has no complains. Follows with ophthalmology and was told visual field is the same but cleared for driving by ophthalmology. BP 155/80.  Interval History During the interval time, [name] has been doing well.  No stroke like symptoms. Followed with GI and was told ulcer healed well. However, [name] still stated that [name] has dark stool everyday. On detailed questioning, [name] is taking iron pills and her stool in toilet has no blood around it. Her Hb was normal at 14.0 2-3 months ago. [name] is still not on antiplatelet now. [name] followed with cardiology and pacemaker seems not showing afib but PACs. BP today 123/66.  REVIEW OF SYSTEMS: Full 14 system review of systems performed and notable only for those listed below and in HPI above, all others are negative:  Constitutional:   Cardiovascular:  Ear/Nose/Throat:   Skin:  Eyes:    Respiratory:   Gastroitestinal:  Black stools Genitourinary:  Hematology/Lymphatic:   Endocrine:  Musculoskeletal:  Walking difficulties Allergy/Immunology:   Neurological:   Psychiatric:  Sleep:   The following represents the patient's updated allergies and side effects list: Allergies  Allergen Reactions  . Clindamycin/Lincomycin Rash  . Antihistamines, Chlorpheniramine-Type     nightmares  . Ativan [Lorazepam]     Possible: agitation  . Imiquimod   . Micardis Hct [Telmisartan-Hctz]     fatigue  . Thiazide-Type Diuretics     Possible photosensitivity  . Ultracet [Tramadol-Acetaminophen]     nightmares    The neurologically relevant items on the patient's problem list were reviewed on today's visit.  Neurologic Examination  A problem focused neurological exam (12 or more points of the single system neurologic examination, vital signs counts as 1 point, cranial nerves count for 8 points) was  performed.  Blood pressure 123/66, pulse 60, height 5\' 4"  (1.626 m), weight 129 lb (58.514 kg).  General - Well nourished, well developed, in no apparent distress.  Ophthalmologic - Fundi not visualized due to eye movement.  Cardiovascular - Regular rate and rhythm.  Mental Status -  Level of arousal and orientation to time, place, and person were intact. Language  including expression, naming, repetition, comprehension was assessed and found intact. Fund of Knowledge was assessed and was intact.  Cranial Nerves II - XII - II - Visual field exam showed right lower quandratanopia. III, IV, VI - Extraocular movements intact. V - Facial sensation intact bilaterally. VII - Facial movement intact bilaterally. VIII - Hearing & vestibular intact bilaterally. X - Palate elevates symmetrically. XI - Chin turning & shoulder shrug intact bilaterally. XII - Tongue protrusion intact.  Motor Strength - The patient's strength was normal in all extremities and pronator drift was absent.  Bulk was normal and fasciculations were absent.   Motor Tone - Muscle tone was assessed at the neck and appendages and was normal.  Reflexes - The patient's reflexes were 1+ in all extremities and [name] had no pathological reflexes.  Sensory - Light touch, temperature/pinprick were assessed and were normal.    Coordination - The patient had normal movements in the hands with no ataxia or dysmetria.  Tremor was absent.  Gait and Station - difficulty getting up from chair, slow, and small stride with mild stooped posturing.  Data reviewed: I personally reviewed the images and agree with the radiology interpretations.  Dg Chest 2 View  12/20/2014 IMPRESSION: Patchy left basilar opacity -favor atelectasis over airspace disease. Consider radiographic follow-up in 2-3 weeks. T6 and L1 compression fractures of uncertain chronicity but new since 2008. Correlate clinically.   Ct Head Wo Contrast  12/22/2014 IMPRESSION: 1. No evidence of acute intracranial hemorrhage or acute stroke. 2. Atrophy and chronic small vessel ischemic changes.  CTA head and neck 12/23/14 - Prominent small vessel disease type changes. No CT evidence of large acute infarct although evaluation for detection of small or medium size infarct is limited by the white matter changes. Global atrophy without  hydrocephalus. 60% diameter stenosis proximal left internal carotid artery. Minimal plaque proximal right internal carotid artery. Codominant vertebral arteries. Slight narrowing distal right vertebral artery. Anterior and posterior intracranial circulation without medium or large size vessel significant stenosis or occlusion. Mild intracranial branch vessel irregularity. By my reading, left ICA no significant stenosis per NASCET criteria.   CTA head 12/27/14 -  1. Newly seen nonhemorrhagic left occipital pole infarct. 2. No acute arterial finding or change from 12/23/2014. No flow limiting stenosis or embolic source detected. 3. Extensive chronic small vessel disease.  2D Echocardiogram  - Left ventricle: The cavity size was normal. Wall thickness was normal. Systolic function was vigorous. The estimated ejection fraction was in the range of 65% to 70%. There is hypokinesis of the inferior myocardium. Doppler parameters are consistent with abnormal left ventricular relaxation (grade 1 diastolic dysfunction). - Right ventricle: Akinesis of the RV mid anterior free wall. Systolic pressure was increased. - Pulmonary arteries: Systolic pressure was mildly increased. PA peak pressure: 35 mm Hg (S). Impressions: - No cardiac source of emboli was indentified.  Component     Latest Ref Rng 12/20/2014 12/21/2014  Cholesterol     0 - 200 mg/dL  104  Triglycerides     <150 mg/dL  126  HDL Cholesterol     >40 mg/dL  31 (L)  Total CHOL/HDL Ratio       3.4  VLDL     0 - 40 mg/dL  25  LDL (calc)     0 - 99 mg/dL  48  Hemoglobin A1C     4.8 - 5.6 % 5.7 (H)   Mean Plasma Glucose      117   Vitamin B-12     180 - 914 pg/mL 1814 (H)   TSH     0.350 - 4.500 uIU/mL 1.037     Assessment: As you may recall, [name] is a 82 y.o. Caucasian female with PMH of history of HTN, CAD, heart block s/p pacer, hx of GIB admitted on 12/20/14 for new GIB due to large gastric ulcer. However,  developed "aphasia" and "left facial droop" but quickly resolved. CT negative as well as CTA head and neck. However, [name] was found to have right hemianopia. CT repeat showed left PCA infarct. Other stroke work up all negative including 2D echo, LDL and A1C. Her stroke was suspicious for embolic pattern. However, as [name] is not anticoagulation candidate, no embolic work up was performed. During the interval time, right hemianopia improved to only right lower quadrantanopsia and ophthalmology has cleared her for driving. [name] follows with GI and had EGD again in 03/2015, was told healing well. However, currently still not on antiplatelet yet. Although [name] complains of daily dark stool but [name] has no anemia and on iron pills which could explain dark stool without blood in toilet. Will repeat CBC and BMP. If no anemia, will consider ASA or plavix.  Plan:  - will check CBC and BMP to see if we can start ASA or plavix for stroke prevention - will continue pacemaker interrogation to see if there is afib episode, if so, we need to talk with GI regarding anticoagulation. - check BP at home and record - Follow up with your primary care physician for stroke risk factor modification. Recommend maintain blood pressure goal <130/80, diabetes with hemoglobin A1c goal below 6.5% and lipids with LDL cholesterol goal below 70 mg/dL.  - follow up with GI and cardiology  - follow up in 6 months.   I spent more than 25 minutes of face to face time with the patient. Greater than 50% of time was spent in counseling and coordination of care. We have discussed about GI status, further work up and options of antiplatelet and anticoagulation.   Orders Placed This Encounter  Procedures  . CBC (no diff)  . Basic metabolic panel  . Lipid panel    No orders of the defined types were placed in this encounter.    Patient Instructions  - will check CBC and BMP to see if we can start ASA or plavix for your stroke prevention -  will continue pacemaker interrogation to see if there is afib episode, if so, we need to talk with GI regarding stronger blood thinners. - check BP at home and record - Follow up with your primary care physician for stroke risk factor modification. Recommend maintain blood pressure goal <130/80, diabetes with hemoglobin A1c goal below 6.5% and lipids with LDL cholesterol goal below 70 mg/dL.  - follow up with GI and cardiology  - follow up in 6 months.     Rosalin Hawking, MD PhD [facility] [address] [place], [place] [address] [phone]

## 2015-09-24 LAB — LIPID PANEL
CHOLESTEROL TOTAL: 260 mg/dL — AB (ref 100–199)
Chol/HDL Ratio: 3.5 ratio units (ref 0.0–4.4)
HDL: 75 mg/dL (ref >39)
LDL CALC: 140 mg/dL — AB (ref 0–99)
TRIGLYCERIDES: 224 mg/dL — AB (ref 0–149)
VLDL CHOLESTEROL CAL: 45 mg/dL — AB (ref 5–40)

## 2015-09-24 LAB — CBC
HEMOGLOBIN: 14.5 g/dL (ref 11.1–15.9)
Hematocrit: 44 % (ref 34.0–46.6)
MCH: 30.7 pg (ref 26.6–33.0)
MCHC: 33 g/dL (ref 31.5–35.7)
MCV: 93 fL (ref 79–97)
PLATELETS: 168 10*3/uL (ref 150–379)
RBC: 4.73 x10E6/uL (ref 3.77–5.28)
RDW: 15.9 % — ABNORMAL HIGH (ref 12.3–15.4)
WBC: 6.1 10*3/uL (ref 3.4–10.8)

## 2015-09-24 LAB — BASIC METABOLIC PANEL
BUN/Creatinine Ratio: 23 (ref 12–28)
BUN: 24 mg/dL (ref 8–27)
CALCIUM: 11.2 mg/dL — AB (ref 8.7–10.3)
CHLORIDE: 102 mmol/L (ref 96–106)
CO2: 27 mmol/L (ref 18–29)
CREATININE: 1.05 mg/dL — AB (ref 0.57–1.00)
GFR calc Af Amer: 55 mL/min/{1.73_m2} — ABNORMAL LOW (ref >59)
GFR calc non Af Amer: 48 mL/min/{1.73_m2} — ABNORMAL LOW (ref >59)
GLUCOSE: 110 mg/dL — AB (ref 65–99)
Potassium: 5.2 mmol/L (ref 3.5–5.2)
Sodium: 144 mmol/L (ref 134–144)

## 2015-10-02 DIAGNOSIS — J309 Allergic rhinitis, unspecified: Secondary | ICD-10-CM | POA: Diagnosis not present

## 2015-10-02 DIAGNOSIS — Z79899 Other long term (current) drug therapy: Secondary | ICD-10-CM | POA: Diagnosis not present

## 2015-10-02 DIAGNOSIS — K922 Gastrointestinal hemorrhage, unspecified: Secondary | ICD-10-CM | POA: Diagnosis not present

## 2015-10-02 DIAGNOSIS — I69398 Other sequelae of cerebral infarction: Secondary | ICD-10-CM | POA: Diagnosis not present

## 2015-10-02 DIAGNOSIS — E559 Vitamin D deficiency, unspecified: Secondary | ICD-10-CM | POA: Diagnosis not present

## 2015-10-02 DIAGNOSIS — I10 Essential (primary) hypertension: Secondary | ICD-10-CM | POA: Diagnosis not present

## 2015-10-02 DIAGNOSIS — Z4802 Encounter for removal of sutures: Secondary | ICD-10-CM | POA: Diagnosis not present

## 2015-10-02 DIAGNOSIS — D509 Iron deficiency anemia, unspecified: Secondary | ICD-10-CM | POA: Diagnosis not present

## 2015-10-02 DIAGNOSIS — M545 Low back pain: Secondary | ICD-10-CM | POA: Diagnosis not present

## 2015-10-02 DIAGNOSIS — I6930 Unspecified sequelae of cerebral infarction: Secondary | ICD-10-CM | POA: Diagnosis not present

## 2015-10-27 ENCOUNTER — Telehealth: Payer: Self-pay | Admitting: Cardiology

## 2015-10-27 ENCOUNTER — Encounter: Payer: Medicare Other | Admitting: *Deleted

## 2015-10-27 NOTE — Telephone Encounter (Signed)
Confirmed remote transmission w/ pt daughter.   

## 2015-10-31 ENCOUNTER — Encounter: Payer: Self-pay | Admitting: Cardiology

## 2015-12-17 DIAGNOSIS — R51 Headache: Secondary | ICD-10-CM | POA: Diagnosis not present

## 2015-12-17 DIAGNOSIS — I69398 Other sequelae of cerebral infarction: Secondary | ICD-10-CM | POA: Diagnosis not present

## 2015-12-17 DIAGNOSIS — R269 Unspecified abnormalities of gait and mobility: Secondary | ICD-10-CM | POA: Diagnosis not present

## 2015-12-17 DIAGNOSIS — I6930 Unspecified sequelae of cerebral infarction: Secondary | ICD-10-CM | POA: Diagnosis not present

## 2015-12-17 DIAGNOSIS — I10 Essential (primary) hypertension: Secondary | ICD-10-CM | POA: Diagnosis not present

## 2015-12-17 DIAGNOSIS — M545 Low back pain: Secondary | ICD-10-CM | POA: Diagnosis not present

## 2016-02-18 DIAGNOSIS — H524 Presbyopia: Secondary | ICD-10-CM | POA: Diagnosis not present

## 2016-02-18 DIAGNOSIS — H5310 Unspecified subjective visual disturbances: Secondary | ICD-10-CM | POA: Diagnosis not present

## 2016-02-18 DIAGNOSIS — Z961 Presence of intraocular lens: Secondary | ICD-10-CM | POA: Diagnosis not present

## 2016-02-25 DIAGNOSIS — M79605 Pain in left leg: Secondary | ICD-10-CM | POA: Diagnosis not present

## 2016-03-25 ENCOUNTER — Ambulatory Visit: Payer: Medicare Other | Admitting: Neurology

## 2016-04-01 DIAGNOSIS — Z0001 Encounter for general adult medical examination with abnormal findings: Secondary | ICD-10-CM | POA: Diagnosis not present

## 2016-04-01 DIAGNOSIS — I6930 Unspecified sequelae of cerebral infarction: Secondary | ICD-10-CM | POA: Diagnosis not present

## 2016-04-01 DIAGNOSIS — R269 Unspecified abnormalities of gait and mobility: Secondary | ICD-10-CM | POA: Diagnosis not present

## 2016-04-01 DIAGNOSIS — Z1389 Encounter for screening for other disorder: Secondary | ICD-10-CM | POA: Diagnosis not present

## 2016-04-01 DIAGNOSIS — E559 Vitamin D deficiency, unspecified: Secondary | ICD-10-CM | POA: Diagnosis not present

## 2016-04-01 DIAGNOSIS — M25562 Pain in left knee: Secondary | ICD-10-CM | POA: Diagnosis not present

## 2016-04-01 DIAGNOSIS — K921 Melena: Secondary | ICD-10-CM | POA: Diagnosis not present

## 2016-04-01 DIAGNOSIS — K259 Gastric ulcer, unspecified as acute or chronic, without hemorrhage or perforation: Secondary | ICD-10-CM | POA: Diagnosis not present

## 2016-04-01 DIAGNOSIS — Z79899 Other long term (current) drug therapy: Secondary | ICD-10-CM | POA: Diagnosis not present

## 2016-04-01 DIAGNOSIS — D509 Iron deficiency anemia, unspecified: Secondary | ICD-10-CM | POA: Diagnosis not present

## 2016-04-01 DIAGNOSIS — I10 Essential (primary) hypertension: Secondary | ICD-10-CM | POA: Diagnosis not present

## 2016-04-01 DIAGNOSIS — M545 Low back pain: Secondary | ICD-10-CM | POA: Diagnosis not present

## 2016-04-01 DIAGNOSIS — R51 Headache: Secondary | ICD-10-CM | POA: Diagnosis not present

## 2016-04-20 ENCOUNTER — Other Ambulatory Visit: Payer: Self-pay | Admitting: Internal Medicine

## 2016-04-20 DIAGNOSIS — I493 Ventricular premature depolarization: Secondary | ICD-10-CM

## 2016-04-21 ENCOUNTER — Encounter: Payer: Self-pay | Admitting: Internal Medicine

## 2016-04-27 ENCOUNTER — Telehealth: Payer: Self-pay

## 2016-04-27 DIAGNOSIS — I493 Ventricular premature depolarization: Secondary | ICD-10-CM

## 2016-04-27 MED ORDER — CARVEDILOL 6.25 MG PO TABS: 6.2500 mg | ORAL_TABLET | Freq: Two times a day (BID) | ORAL | 1 refills | Status: DC

## 2016-04-27 MED ORDER — FLECAINIDE ACETATE 50 MG PO TABS
50.0000 mg | ORAL_TABLET | Freq: Two times a day (BID) | ORAL | 1 refills | Status: DC
Start: 2016-04-27 — End: 2016-07-29

## 2016-04-27 NOTE — Telephone Encounter (Signed)
Called, spoke with pt's daughter, Melody (on [place]). Informed we will send in Rx of Carvedilol and Flecainide to last until 1 year follow-up recall in 3/18. Melody requested to send the prescriptions to [facility].  Melody verbalized understanding.

## 2016-05-08 DIAGNOSIS — S9031XA Contusion of right foot, initial encounter: Secondary | ICD-10-CM | POA: Diagnosis not present

## 2016-05-08 DIAGNOSIS — S92351A Displaced fracture of fifth metatarsal bone, right foot, initial encounter for closed fracture: Secondary | ICD-10-CM | POA: Diagnosis not present

## 2016-05-09 DIAGNOSIS — S92351A Displaced fracture of fifth metatarsal bone, right foot, initial encounter for closed fracture: Secondary | ICD-10-CM | POA: Diagnosis not present

## 2016-05-21 DIAGNOSIS — S92351D Displaced fracture of fifth metatarsal bone, right foot, subsequent encounter for fracture with routine healing: Secondary | ICD-10-CM | POA: Diagnosis not present

## 2016-06-11 DIAGNOSIS — S92351D Displaced fracture of fifth metatarsal bone, right foot, subsequent encounter for fracture with routine healing: Secondary | ICD-10-CM | POA: Diagnosis not present

## 2016-07-09 DIAGNOSIS — S92351D Displaced fracture of fifth metatarsal bone, right foot, subsequent encounter for fracture with routine healing: Secondary | ICD-10-CM | POA: Diagnosis not present

## 2016-07-29 ENCOUNTER — Encounter: Payer: Self-pay | Admitting: Internal Medicine

## 2016-07-29 ENCOUNTER — Ambulatory Visit (INDEPENDENT_AMBULATORY_CARE_PROVIDER_SITE_OTHER): Payer: Medicare Other | Admitting: Internal Medicine

## 2016-07-29 VITALS — BP 152/82 | HR 67 | Ht 64.5 in | Wt 129.0 lb

## 2016-07-29 DIAGNOSIS — I442 Atrioventricular block, complete: Secondary | ICD-10-CM | POA: Diagnosis not present

## 2016-07-29 DIAGNOSIS — I493 Ventricular premature depolarization: Secondary | ICD-10-CM | POA: Diagnosis not present

## 2016-07-29 DIAGNOSIS — Z950 Presence of cardiac pacemaker: Secondary | ICD-10-CM

## 2016-07-29 LAB — CUP PACEART INCLINIC DEVICE CHECK
Battery Impedance: 134 Ohm
Battery Voltage: 2.78 V
Date Time Interrogation Session: 20180405101302
Implantable Lead Implant Date: 20080204
Implantable Lead Location: 753859
Implantable Lead Location: 753860
Implantable Lead Model: 5076
Lead Channel Impedance Value: 537 Ohm
Lead Channel Impedance Value: 537 Ohm
Lead Channel Impedance Value: 697 Ohm
Lead Channel Pacing Threshold Amplitude: 0.75 V
Lead Channel Pacing Threshold Pulse Width: 0.4 ms
Lead Channel Sensing Intrinsic Amplitude: 2 mV
Lead Channel Setting Pacing Pulse Width: 0.4 ms
MDC IDC LEAD IMPLANT DT: 20080204
MDC IDC MSMT BATTERY REMAINING LONGEVITY: 126 mo
MDC IDC MSMT LEADCHNL RA PACING THRESHOLD PULSEWIDTH: 0.4 ms
MDC IDC MSMT LEADCHNL RV IMPEDANCE VALUE: 697 Ohm
MDC IDC MSMT LEADCHNL RV PACING THRESHOLD AMPLITUDE: 0.5 V
MDC IDC PG IMPLANT DT: 20170202
MDC IDC SET LEADCHNL RA PACING AMPLITUDE: 2 V
MDC IDC SET LEADCHNL RV PACING AMPLITUDE: 2.5 V
MDC IDC SET LEADCHNL RV SENSING SENSITIVITY: 5.6 mV
MDC IDC STAT BRADY AP VP PERCENT: 58 %
MDC IDC STAT BRADY AP VS PERCENT: 0 %
MDC IDC STAT BRADY AS VP PERCENT: 42 %
MDC IDC STAT BRADY AS VS PERCENT: 0 %

## 2016-07-29 MED ORDER — FLECAINIDE ACETATE 50 MG PO TABS
50.0000 mg | ORAL_TABLET | Freq: Two times a day (BID) | ORAL | 3 refills | Status: AC
Start: 2016-07-29 — End: ?

## 2016-07-29 MED ORDER — CARVEDILOL 6.25 MG PO TABS: 6.2500 mg | ORAL_TABLET | Freq: Two times a day (BID) | ORAL | 3 refills | Status: AC

## 2016-07-29 NOTE — Progress Notes (Signed)
HPI [name] returns today for ongoing evaluation and management of her palpitations. She is a pleasant 83 yo woman, with a h/o CHB, s/p PPM insertion and HTN. The patient denies anginal symptoms. No sob or syncope. Minimal peripheral edema. In the interim, she has been stable. She has begun to slow down however and admits to being not as active.  Allergies  Allergen Reactions  . Clindamycin/Lincomycin Rash  . Antihistamines, Chlorpheniramine-Type     nightmares  . Ativan [Lorazepam]     Possible: agitation  . Imiquimod     unknown  . Micardis Hct [Telmisartan-Hctz]     fatigue  . Thiazide-Type Diuretics     Possible photosensitivity  . Ultracet [Tramadol-Acetaminophen]     nightmares     Current Outpatient Prescriptions  Medication Sig Dispense Refill  . amLODipine (NORVASC) 5 MG tablet Take 2.5 mg by mouth daily.    . Ascorbic Acid (VITAMIN C PO) Take 1 tablet by mouth daily.    Marland Kitchen aspirin EC 81 MG tablet Take 81 mg by mouth daily.    . Calcium Carb-Cholecalciferol (CALCIUM 600+D3) 600-800 MG-UNIT TABS Take 1 tablet by mouth daily.    . carvedilol (COREG) 6.25 MG tablet Take 1 tablet (6.25 mg total) by mouth 2 (two) times daily. 180 tablet 1  . cholecalciferol (VITAMIN D) 400 UNITS TABS tablet Take 2,000 Units by mouth daily.     . ferrous sulfate 325 (65 FE) MG tablet Take 325 mg by mouth daily with breakfast.    . flecainide (TAMBOCOR) 50 MG tablet Take 1 tablet (50 mg total) by mouth 2 (two) times daily. 180 tablet 1  . losartan (COZAAR) 25 MG tablet Take 25 mg by mouth daily.    . Multiple Vitamin (MULTIVITAMIN) capsule Take 1 capsule by mouth daily.    . Omega-3 Fatty Acids (FISH OIL) 1200 MG CAPS Take 1,200 mg by mouth daily.    Marland Kitchen omeprazole (PRILOSEC) 40 MG capsule Take 40 mg by mouth 3 (three) times a week.     . traMADol (ULTRAM) 50 MG tablet Take 50 mg by mouth every 6 (six) hours as needed for moderate pain.     Marland Kitchen triamterene-hydrochlorothiazide  (MAXZIDE-25) 37.5-25 MG tablet Take 0.5 tablets by mouth daily.     . vitamin B-12 (CYANOCOBALAMIN) 500 MCG tablet Take 500 mcg by mouth daily.     No current facility-administered medications for this visit.      Past Medical History:  Diagnosis Date  . Allergic rhinitis   . Anxiety   . Arthritis    Left AC joint  . Complete heart block (HCC)    a. s/p MDT dual chamber pacemaker  . Complication of anesthesia    states allergy to a med used with pacemaker - made her wild  . Compression fracture of L1 lumbar vertebra (HCC)   . Depression    situational  . Facial rash   . Fibrocystic disease of breast   . GI bleed    a. while on ASA  . Hypertension   . Microscopic hematuria    with negative workup   . Seborrheic keratoses   . Stroke ([place])   . Vision abnormalities     ROS:   All systems reviewed and negative except as noted in the HPI.   Past Surgical History:  Procedure Laterality Date  . EP IMPLANTABLE DEVICE [name] 05/29/2015   Procedure: PPM/BIV PPM Generator Changeout;  Surgeon: Evans Lance, MD;  Location: [place] [facility];  Service: Cardiovascular;  Laterality: [name];  . ESOPHAGOGASTRODUODENOSCOPY Left 03/22/2014   Procedure: ESOPHAGOGASTRODUODENOSCOPY (EGD);  Surgeon: Arta Silence, MD;  Location: [facility] ENDOSCOPY;  Service: Endoscopy;  Laterality: Left;  . ESOPHAGOGASTRODUODENOSCOPY [name] 12/20/2014   Procedure: ESOPHAGOGASTRODUODENOSCOPY (EGD);  Surgeon: Teena Irani, MD;  Location: [facility] ENDOSCOPY;  Service: Endoscopy;  Laterality: [name];  . INSERT / REPLACE / REMOVE PACEMAKER       Family History  Problem Relation Age of Onset  . Diabetes Mother   . Stroke Mother   . Coronary artery disease Mother   . Cancer - Lung Father   . Hypertension Sister   . Hypertension Brother   . Diabetes Brother   . Cancer Brother   . Hypertension Brother   . Diabetes Brother   . Cancer Brother      Social History   Social History  . Marital status: Widowed    Spouse name: [name]   . Number of children: [name]  . Years of education: [name]   Occupational History  . Not on file.   Social History Main Topics  . Smoking status: Never Smoker  . Smokeless tobacco: Never Used  . Alcohol use No  . Drug use: No  . Sexual activity: Not on file   Other Topics Concern  . Not on file   Social History Narrative  . No narrative on file     BP (!) 152/82   Pulse 67   Ht 5' 4.5" (1.638 m)   Wt 129 lb (58.5 kg)   SpO2 97%   BMI 21.80 kg/m   Physical Exam:  stable appearing elderly woman, NAD HEENT: Unremarkable Neck:  6 cm JVD, no thyromegally Back:  No CVA tenderness Lungs:  Clear with no wheezes, rales, or rhonchi HEART:  Regular rate rhythm, no murmurs, no rubs, no clicks Abd:  soft, positive bowel sounds, no organomegally, no rebound, no guarding Ext:  2 plus pulses, no edema, no cyanosis, no clubbing Skin:  No rashes no nodules Neuro:  CN II through XII intact, motor grossly intact  ECG - NSR with AV pacing. Since prior tracing, PVC's have resolved.  DEVICE  Normal device function.  See PaceArt for details.   Assess/Plan: 1. PVC's - these are much improved since starting flecainide. Will continue.  2. CHB - she is s/p PPM and stable. No escape 3. PPM - Her medtronic DDD PM is working normally. Will recheck in several months. 4. HTN - her blood pressure is still elevated on coreg. No change in dose. I would rather she be a little hight than a little low.   Mikle Bosworth.D.

## 2016-07-29 NOTE — Patient Instructions (Signed)
Medication Instructions:  Your physician recommends that you continue on your current medications as directed. Please refer to the Current Medication list given to you today.   Labwork: none  Testing/Procedures: none  Follow-Up: Remote monitoring is used to monitor your Pacemaker of ICD from home. This monitoring reduces the number of office visits required to check your device to one time per year. It allows us to keep an eye on the functioning of your device to ensure it is working properly. You are scheduled for a device check from home on 10/28/16. You may send your transmission at any time that day. If you have a wireless device, the transmission will be sent automatically. After your physician reviews your transmission, you will receive a postcard with your next transmission date.  Your physician wants you to follow-up in: 12 months.  You will receive a reminder letter in the mail two months in advance. If you don't receive a letter, please call our office to schedule the follow-up appointment.   Any Other Special Instructions Will Be Listed Below (If Applicable).     If you need a refill on your cardiac medications before your next appointment, please call your pharmacy.   

## 2016-07-29 NOTE — Addendum Note (Signed)
Addended by: Thompson Grayer on: 07/29/2016 09:16 AM   Modules accepted: Orders

## 2016-10-07 DIAGNOSIS — I872 Venous insufficiency (chronic) (peripheral): Secondary | ICD-10-CM | POA: Diagnosis not present

## 2016-10-07 DIAGNOSIS — L03116 Cellulitis of left lower limb: Secondary | ICD-10-CM | POA: Diagnosis not present

## 2016-10-12 DIAGNOSIS — I872 Venous insufficiency (chronic) (peripheral): Secondary | ICD-10-CM | POA: Diagnosis not present

## 2016-10-12 DIAGNOSIS — L03116 Cellulitis of left lower limb: Secondary | ICD-10-CM | POA: Diagnosis not present

## 2016-10-12 DIAGNOSIS — I69398 Other sequelae of cerebral infarction: Secondary | ICD-10-CM | POA: Diagnosis not present

## 2016-10-12 DIAGNOSIS — L989 Disorder of the skin and subcutaneous tissue, unspecified: Secondary | ICD-10-CM | POA: Diagnosis not present

## 2016-10-12 DIAGNOSIS — I10 Essential (primary) hypertension: Secondary | ICD-10-CM | POA: Diagnosis not present

## 2016-10-12 DIAGNOSIS — E559 Vitamin D deficiency, unspecified: Secondary | ICD-10-CM | POA: Diagnosis not present

## 2016-10-12 DIAGNOSIS — D509 Iron deficiency anemia, unspecified: Secondary | ICD-10-CM | POA: Diagnosis not present

## 2016-10-12 DIAGNOSIS — D0439 Carcinoma in situ of skin of other parts of face: Secondary | ICD-10-CM | POA: Diagnosis not present

## 2016-10-28 ENCOUNTER — Encounter: Payer: Medicare Other | Admitting: *Deleted

## 2016-10-28 ENCOUNTER — Telehealth: Payer: Self-pay | Admitting: Cardiology

## 2016-10-28 NOTE — Telephone Encounter (Signed)
Confirmed remote transmission w/ pt daughter.   

## 2016-10-29 ENCOUNTER — Encounter: Payer: Self-pay | Admitting: Cardiology

## 2016-10-29 NOTE — Progress Notes (Signed)
Letter  

## 2016-11-03 DIAGNOSIS — Z85828 Personal history of other malignant neoplasm of skin: Secondary | ICD-10-CM | POA: Diagnosis not present

## 2016-11-03 DIAGNOSIS — C44329 Squamous cell carcinoma of skin of other parts of face: Secondary | ICD-10-CM | POA: Diagnosis not present

## 2017-02-05 DIAGNOSIS — W19XXXA Unspecified fall, initial encounter: Secondary | ICD-10-CM | POA: Diagnosis not present

## 2017-02-05 DIAGNOSIS — M545 Low back pain: Secondary | ICD-10-CM | POA: Diagnosis not present

## 2017-02-08 DIAGNOSIS — H524 Presbyopia: Secondary | ICD-10-CM | POA: Diagnosis not present

## 2017-02-08 DIAGNOSIS — H353132 Nonexudative age-related macular degeneration, bilateral, intermediate dry stage: Secondary | ICD-10-CM | POA: Diagnosis not present

## 2017-02-08 DIAGNOSIS — Z961 Presence of intraocular lens: Secondary | ICD-10-CM | POA: Diagnosis not present

## 2017-02-24 ENCOUNTER — Emergency Department (HOSPITAL_COMMUNITY): Payer: Medicare Other

## 2017-02-24 ENCOUNTER — Encounter (HOSPITAL_COMMUNITY): Payer: Self-pay

## 2017-02-24 ENCOUNTER — Emergency Department (HOSPITAL_COMMUNITY)
Admission: EM | Admit: 2017-02-24 | Discharge: 2017-02-24 | Disposition: A | Discharge status: Home / Self Care | Payer: Medicare Other | Attending: Emergency Medicine | Admitting: Emergency Medicine

## 2017-02-24 DIAGNOSIS — M549 Dorsalgia, unspecified: Secondary | ICD-10-CM | POA: Diagnosis not present

## 2017-02-24 DIAGNOSIS — Y929 Unspecified place or not applicable: Secondary | ICD-10-CM | POA: Diagnosis not present

## 2017-02-24 DIAGNOSIS — N390 Urinary tract infection, site not specified: Secondary | ICD-10-CM | POA: Diagnosis not present

## 2017-02-24 DIAGNOSIS — R112 Nausea with vomiting, unspecified: Secondary | ICD-10-CM | POA: Diagnosis not present

## 2017-02-24 DIAGNOSIS — Y939 Activity, unspecified: Secondary | ICD-10-CM | POA: Insufficient documentation

## 2017-02-24 DIAGNOSIS — Z79899 Other long term (current) drug therapy: Secondary | ICD-10-CM | POA: Diagnosis not present

## 2017-02-24 DIAGNOSIS — R404 Transient alteration of awareness: Secondary | ICD-10-CM | POA: Diagnosis not present

## 2017-02-24 DIAGNOSIS — S32030A Wedge compression fracture of third lumbar vertebra, initial encounter for closed fracture: Secondary | ICD-10-CM | POA: Insufficient documentation

## 2017-02-24 DIAGNOSIS — I10 Essential (primary) hypertension: Secondary | ICD-10-CM | POA: Insufficient documentation

## 2017-02-24 DIAGNOSIS — I2510 Atherosclerotic heart disease of native coronary artery without angina pectoris: Secondary | ICD-10-CM | POA: Insufficient documentation

## 2017-02-24 DIAGNOSIS — Z7982 Long term (current) use of aspirin: Secondary | ICD-10-CM | POA: Diagnosis not present

## 2017-02-24 DIAGNOSIS — X58XXXA Exposure to other specified factors, initial encounter: Secondary | ICD-10-CM | POA: Diagnosis not present

## 2017-02-24 DIAGNOSIS — Y999 Unspecified external cause status: Secondary | ICD-10-CM | POA: Insufficient documentation

## 2017-02-24 DIAGNOSIS — R531 Weakness: Secondary | ICD-10-CM | POA: Diagnosis not present

## 2017-02-24 DIAGNOSIS — R0602 Shortness of breath: Secondary | ICD-10-CM | POA: Diagnosis not present

## 2017-02-24 LAB — COMPREHENSIVE METABOLIC PANEL
ALBUMIN: 3 g/dL — AB (ref 3.5–5.0)
ALK PHOS: 58 U/L (ref 38–126)
ALT: 19 U/L (ref 14–54)
ANION GAP: 10 (ref 5–15)
AST: 31 U/L (ref 15–41)
BUN: 30 mg/dL — AB (ref 6–20)
CALCIUM: 9.7 mg/dL (ref 8.9–10.3)
CO2: 27 mmol/L (ref 22–32)
CREATININE: 1.07 mg/dL — AB (ref 0.44–1.00)
Chloride: 104 mmol/L (ref 101–111)
GFR calc Af Amer: 51 mL/min — ABNORMAL LOW (ref >60)
GFR calc non Af Amer: 44 mL/min — ABNORMAL LOW (ref >60)
GLUCOSE: 132 mg/dL — AB (ref 65–99)
Potassium: 3.5 mmol/L (ref 3.5–5.1)
SODIUM: 141 mmol/L (ref 135–145)
Total Bilirubin: 1.1 mg/dL (ref 0.3–1.2)
Total Protein: 6.3 g/dL — ABNORMAL LOW (ref 6.5–8.1)

## 2017-02-24 LAB — CBC WITH DIFFERENTIAL/PLATELET
BASOS ABS: 0 10*3/uL (ref 0.0–0.1)
BASOS PCT: 0 %
EOS ABS: 0 10*3/uL (ref 0.0–0.7)
EOS PCT: 0 %
HCT: 39.5 % (ref 36.0–46.0)
HEMOGLOBIN: 13.5 g/dL (ref 12.0–15.0)
LYMPHS ABS: 0.4 10*3/uL — AB (ref 0.7–4.0)
Lymphocytes Relative: 4 %
MCH: 30.8 pg (ref 26.0–34.0)
MCHC: 34.2 g/dL (ref 30.0–36.0)
MCV: 90 fL (ref 78.0–100.0)
Monocytes Absolute: 0.3 10*3/uL (ref 0.1–1.0)
Monocytes Relative: 3 %
NEUTROS PCT: 93 %
Neutro Abs: 10.3 10*3/uL — ABNORMAL HIGH (ref 1.7–7.7)
PLATELETS: 121 10*3/uL — AB (ref 150–400)
RBC: 4.39 MIL/uL (ref 3.87–5.11)
RDW: 13.6 % (ref 11.5–15.5)
WBC: 11.1 10*3/uL — AB (ref 4.0–10.5)

## 2017-02-24 LAB — URINALYSIS, ROUTINE W REFLEX MICROSCOPIC
BILIRUBIN URINE: NEGATIVE
GLUCOSE, UA: NEGATIVE mg/dL
KETONES UR: NEGATIVE mg/dL
NITRITE: NEGATIVE
PH: 5 (ref 5.0–8.0)
Protein, ur: 30 mg/dL — AB
SPECIFIC GRAVITY, URINE: 1.014 (ref 1.005–1.030)
Squamous Epithelial / LPF: NONE SEEN

## 2017-02-24 LAB — LIPASE, BLOOD: Lipase: 13 U/L (ref 11–51)

## 2017-02-24 MED ORDER — SODIUM CHLORIDE 0.9 % IV BOLUS (SEPSIS)
500.0000 mL | Freq: Once | INTRAVENOUS | Status: AC
  Administered 2017-02-24: 500 mL via INTRAVENOUS

## 2017-02-24 MED ORDER — CEPHALEXIN 500 MG PO CAPS: 500.0000 mg | ORAL_CAPSULE | Freq: Four times a day (QID) | ORAL | 0 refills | Status: AC

## 2017-02-24 NOTE — ED Notes (Signed)
Pt given ice water for po challenge

## 2017-02-24 NOTE — ED Notes (Signed)
Bed: WA06 Expected date:  Expected time:  Means of arrival:  Comments: EMS nvd 83 yo

## 2017-02-24 NOTE — ED Triage Notes (Signed)
Patient BIB EMS from home with c/o nausea/vomiting/diarrhea starting at 0200 this morning. Patient also complained of shortness of breath- EMS placed patient on [facility] and patient oxygen saturations went from 90% RA to 98% 2LNC. Patient has permanent, non-demand pacemaker and is V-paced. Patient has no other complaints at this time. 20G Right AC PIV.

## 2017-02-24 NOTE — ED Provider Notes (Signed)
[facility] DEPT Provider Note   CSN: [id number] Arrival date & time: 02/24/17  1003     History   Chief Complaint Chief Complaint  Patient presents with  . Nausea  . Emesis  . Diarrhea    HPI [name] is a 83 y.o. female.  83 year old female presents by EMS for evaluation of nausea and vomiting. Upon arrival patient is without acute complaint. 44 of hx obtained from Family (Daughter and Granddaughter). Patient apparently was complaining of feeling "cold" early this morning. She apparently vomited twice at around 0400. No fever documented at home. No reported chest pain, shortness of breath, abdominal pain, urinary symptoms, or BM changes.   She received 250ML of NS enroute to the ED from EMS.   PMD is Mertha Finders.    The history is provided by the patient and a relative. History limited by: mild dementia.  Emesis   This is a new problem. The current episode started 3 to 5 hours ago. The problem occurs 2 to 4 times per day. The problem has been gradually improving. The emesis has an appearance of stomach contents. There has been no fever. Pertinent negatives include no fever.    Past Medical History:  Diagnosis Date  . Allergic rhinitis   . Anxiety   . Arthritis    Left AC joint  . Complete heart block (HCC)    a. s/p MDT dual chamber pacemaker  . Complication of anesthesia    states allergy to a med used with pacemaker - made her wild  . Compression fracture of L1 lumbar vertebra (HCC)   . Depression    situational  . Facial rash   . Fibrocystic disease of breast   . GI bleed    a. while on ASA  . Hypertension   . Microscopic hematuria    with negative workup   . Seborrheic keratoses   . Stroke ([place])   . Vision abnormalities     Patient Active Problem List   Diagnosis Date Noted  . Cerebrovascular accident (CVA) due to embolism of left posterior cerebral artery ([place]) 02/11/2015  . AVB (atrioventricular  block) 02/11/2015  . Cardiac pacemaker in situ 02/11/2015  . Cerebral infarction due to embolism of left posterior cerebral artery ([place])   . Acute ischemic stroke ([place])   . Acute upper GI bleed   . Acute blood loss anemia   . Acute gastric ulcer   . Accelerated hypertension   . Dyslipidemia 12/20/2014  . Bleeding gastrointestinal   . Renal insufficiency   . Faintness   . Chronic headaches 06/28/2014  . Bilateral lower extremity edema 06/28/2014  . CAD (coronary artery disease), CAth 04/2004 non obst 03/22/2014  . Essential hypertension 11/21/2013  . Pacemaker 05/19/2013  . Complete heart block s/p PPM 2009 05/19/2013    Past Surgical History:  Procedure Laterality Date  . EP IMPLANTABLE DEVICE [name] 05/29/2015   Procedure: PPM/BIV PPM Generator Changeout;  Surgeon: Evans Lance, MD;  Location: [facility];  Service: Cardiovascular;  Laterality: [name];  . ESOPHAGOGASTRODUODENOSCOPY Left 03/22/2014   Procedure: ESOPHAGOGASTRODUODENOSCOPY (EGD);  Surgeon: Arta Silence, MD;  Location: [facility] ENDOSCOPY;  Service: Endoscopy;  Laterality: Left;  . ESOPHAGOGASTRODUODENOSCOPY [name] 12/20/2014   Procedure: ESOPHAGOGASTRODUODENOSCOPY (EGD);  Surgeon: Teena Irani, MD;  Location[facility] ENDOSCOPY;  Service: Endoscopy;  Laterality: [name];  . INSERT / REPLACE / REMOVE PACEMAKER      OB History    No data available  Home Medications    Prior to Admission medications   Medication Sig Start Date End Date Taking? Authorizing Provider  amLODipine (NORVASC) 5 MG tablet Take 2.5 mg by mouth daily.    [provider]  Ascorbic Acid (VITAMIN C PO) Take 1 tablet by mouth daily.    [provider]  aspirin EC 81 MG tablet Take 81 mg by mouth daily.    [provider]  Calcium Carb-Cholecalciferol (CALCIUM 600+D3) 600-800 MG-UNIT TABS Take 1 tablet by mouth daily.    [provider]  carvedilol (COREG) 6.25 MG tablet Take 1 tablet (6.25 mg total) by mouth 2 (two)  times daily. 07/29/16   Evans Lance, MD  cholecalciferol (VITAMIN D) 400 UNITS TABS tablet Take 2,000 Units by mouth daily.     [provider]  ferrous sulfate 325 (65 FE) MG tablet Take 325 mg by mouth daily with breakfast.    [provider]  flecainide (TAMBOCOR) 50 MG tablet Take 1 tablet (50 mg total) by mouth 2 (two) times daily. 07/29/16   Evans Lance, MD  losartan (COZAAR) 25 MG tablet Take 25 mg by mouth daily.    [provider]  Multiple Vitamin (MULTIVITAMIN) capsule Take 1 capsule by mouth daily.    [provider]  Omega-3 Fatty Acids (FISH OIL) 1200 MG CAPS Take 1,200 mg by mouth daily.    [provider]  omeprazole (PRILOSEC) 40 MG capsule Take 40 mg by mouth 3 (three) times a week.     [provider]  traMADol (ULTRAM) 50 MG tablet Take 50 mg by mouth every 6 (six) hours as needed for moderate pain.     [provider]  triamterene-hydrochlorothiazide (MAXZIDE-25) 37.5-25 MG tablet Take 0.5 tablets by mouth daily.     [provider]  vitamin B-12 (CYANOCOBALAMIN) 500 MCG tablet Take 500 mcg by mouth daily.    [provider]    Family History Family History  Problem Relation Age of Onset  . Diabetes Mother   . Stroke Mother   . Coronary artery disease Mother   . Cancer - Lung Father   . Hypertension Sister   . Hypertension Brother   . Diabetes Brother   . Cancer Brother   . Hypertension Brother   . Diabetes Brother   . Cancer Brother     Social History Social History  Substance Use Topics  . Smoking status: Never Smoker  . Smokeless tobacco: Never Used  . Alcohol use No     Allergies   Clindamycin/lincomycin; Antihistamines, chlorpheniramine-type; Ativan [lorazepam]; Imiquimod; Micardis hct [telmisartan-hctz]; Thiazide-type diuretics; and Ultracet [tramadol-acetaminophen]   Review of Systems Review of Systems  Constitutional: Negative for fever.  Gastrointestinal:  Positive for nausea.  All other systems reviewed and are negative.    Physical Exam Updated Vital Signs There were no vitals taken for this visit.  Physical Exam  Constitutional: She is oriented to person, place, and time. She appears well-developed and well-nourished. No distress.  HENT:  Head: Normocephalic and atraumatic.  Mouth/Throat: Oropharynx is clear and moist.  Eyes: Pupils are equal, round, and reactive to light. Conjunctivae and EOM are normal.  Neck: Normal range of motion. Neck supple.  Cardiovascular: Normal rate, regular rhythm and normal heart sounds.   No murmur heard. Pulmonary/Chest: Effort normal and breath sounds normal. No respiratory distress.  Abdominal: Soft. Bowel sounds are normal. She exhibits no distension. There is no tenderness. There is no guarding.  Musculoskeletal: Normal  range of motion. She exhibits no deformity.  Neurological: She is alert and oriented to person, place, and time.  Skin: Skin is warm and dry.  Psychiatric: She has a normal mood and affect.  Nursing note and vitals reviewed.    ED Treatments / Results  Labs (all labs ordered are listed, but only abnormal results are displayed) Labs Reviewed  COMPREHENSIVE METABOLIC PANEL  LIPASE, BLOOD  CBC WITH DIFFERENTIAL/PLATELET    EKG  1039  EKG Ventricular Paced Rate 91 No acute ischemic changes No changes compared to prior    Radiology No results found.  Procedures Procedures (including critical care time)  Medications Ordered in ED Medications  sodium chloride 0.9 % bolus 500 mL (not administered)     Initial Impression / Assessment and Plan / ED Course  I have reviewed the triage vital signs and the nursing notes.  Pertinent labs & imaging results that were available during my care of the patient were reviewed by me and considered in my medical decision making (see chart for details).   1200 - Patient remains comfortable. Will Trial PO Fluids and likely DC home.  Patient desires DC home and declines further workup/treatment. Family at bedside aware of ED findings (Likely UTI and L3 Compression fracture.    1245 - Pt tolerating PO and still desires DC home. Family comfortable with taking Pt home.     MSE - Screening Complete. Suspect pt's symptoms today are secondary to early UTI and will treat with outpatient antibiotics. Back pain appears to be chronic in nature and L3 compression fracture does not appear to be acute. Family and patient are aware of need for close FU. Strict return precautions given and understood.   Final Clinical Impressions(s) / ED Diagnoses   Final diagnoses:  Urinary tract infection without hematuria, site unspecified  Closed compression fracture of third lumbar vertebra, initial encounter [facility])    New Prescriptions New Prescriptions   No medications on file     Valarie Merino, MD 02/24/17 1249

## 2017-02-24 NOTE — ED Notes (Signed)
Patient transported to x-ray. ?

## 2017-03-01 DIAGNOSIS — N390 Urinary tract infection, site not specified: Secondary | ICD-10-CM | POA: Diagnosis not present

## 2017-03-01 DIAGNOSIS — S32000D Wedge compression fracture of unspecified lumbar vertebra, subsequent encounter for fracture with routine healing: Secondary | ICD-10-CM | POA: Diagnosis not present

## 2017-03-01 DIAGNOSIS — Z23 Encounter for immunization: Secondary | ICD-10-CM | POA: Diagnosis not present

## 2017-03-02 ENCOUNTER — Other Ambulatory Visit: Payer: Self-pay | Admitting: Internal Medicine

## 2017-03-02 DIAGNOSIS — S32030D Wedge compression fracture of third lumbar vertebra, subsequent encounter for fracture with routine healing: Secondary | ICD-10-CM

## 2017-03-04 ENCOUNTER — Other Ambulatory Visit: Payer: Self-pay | Admitting: Internal Medicine

## 2017-03-04 DIAGNOSIS — S32030A Wedge compression fracture of third lumbar vertebra, initial encounter for closed fracture: Secondary | ICD-10-CM

## 2017-03-08 ENCOUNTER — Encounter: Payer: Self-pay | Admitting: Internal Medicine

## 2017-03-09 ENCOUNTER — Ambulatory Visit
Admission: RE | Admit: 2017-03-09 | Discharge: 2017-03-09 | Disposition: A | Discharge status: Home / Self Care | Payer: Medicare Other | Source: Ambulatory Visit | Attending: Internal Medicine | Admitting: Internal Medicine

## 2017-03-09 DIAGNOSIS — S32000A Wedge compression fracture of unspecified lumbar vertebra, initial encounter for closed fracture: Secondary | ICD-10-CM | POA: Diagnosis not present

## 2017-03-09 DIAGNOSIS — S32030A Wedge compression fracture of third lumbar vertebra, initial encounter for closed fracture: Secondary | ICD-10-CM

## 2017-03-09 DIAGNOSIS — M545 Low back pain: Secondary | ICD-10-CM | POA: Diagnosis not present

## 2017-03-09 DIAGNOSIS — N390 Urinary tract infection, site not specified: Secondary | ICD-10-CM | POA: Diagnosis not present

## 2017-03-14 ENCOUNTER — Other Ambulatory Visit (HOSPITAL_COMMUNITY): Payer: Self-pay | Admitting: Internal Medicine

## 2017-03-14 DIAGNOSIS — M5416 Radiculopathy, lumbar region: Secondary | ICD-10-CM

## 2017-03-21 ENCOUNTER — Encounter (HOSPITAL_COMMUNITY): Payer: Medicare Other

## 2017-03-21 ENCOUNTER — Encounter (HOSPITAL_COMMUNITY): Payer: Self-pay

## 2017-03-21 ENCOUNTER — Encounter (HOSPITAL_COMMUNITY): Payer: Medicare Other | Attending: Internal Medicine

## 2017-04-20 DIAGNOSIS — M810 Age-related osteoporosis without current pathological fracture: Secondary | ICD-10-CM | POA: Diagnosis not present

## 2017-04-25 DIAGNOSIS — M546 Pain in thoracic spine: Secondary | ICD-10-CM | POA: Diagnosis not present

## 2017-04-25 DIAGNOSIS — R413 Other amnesia: Secondary | ICD-10-CM | POA: Diagnosis not present

## 2017-04-25 DIAGNOSIS — S32000A Wedge compression fracture of unspecified lumbar vertebra, initial encounter for closed fracture: Secondary | ICD-10-CM | POA: Diagnosis not present

## 2017-05-06 DIAGNOSIS — G8929 Other chronic pain: Secondary | ICD-10-CM | POA: Diagnosis not present

## 2017-05-06 DIAGNOSIS — R451 Restlessness and agitation: Secondary | ICD-10-CM | POA: Diagnosis not present

## 2017-05-06 DIAGNOSIS — R350 Frequency of micturition: Secondary | ICD-10-CM | POA: Diagnosis not present

## 2017-05-06 DIAGNOSIS — M545 Low back pain: Secondary | ICD-10-CM | POA: Diagnosis not present

## 2017-05-07 IMAGING — DX DG CHEST 2V
2 series · 2 of 2 positions shown · non-contrast
Comparison: 05/31/2006 chest radiograph

CLINICAL DATA: Chest pain for 1 day and acute syncope today.

EXAM:
CHEST  2 VIEW

[w chest lat]
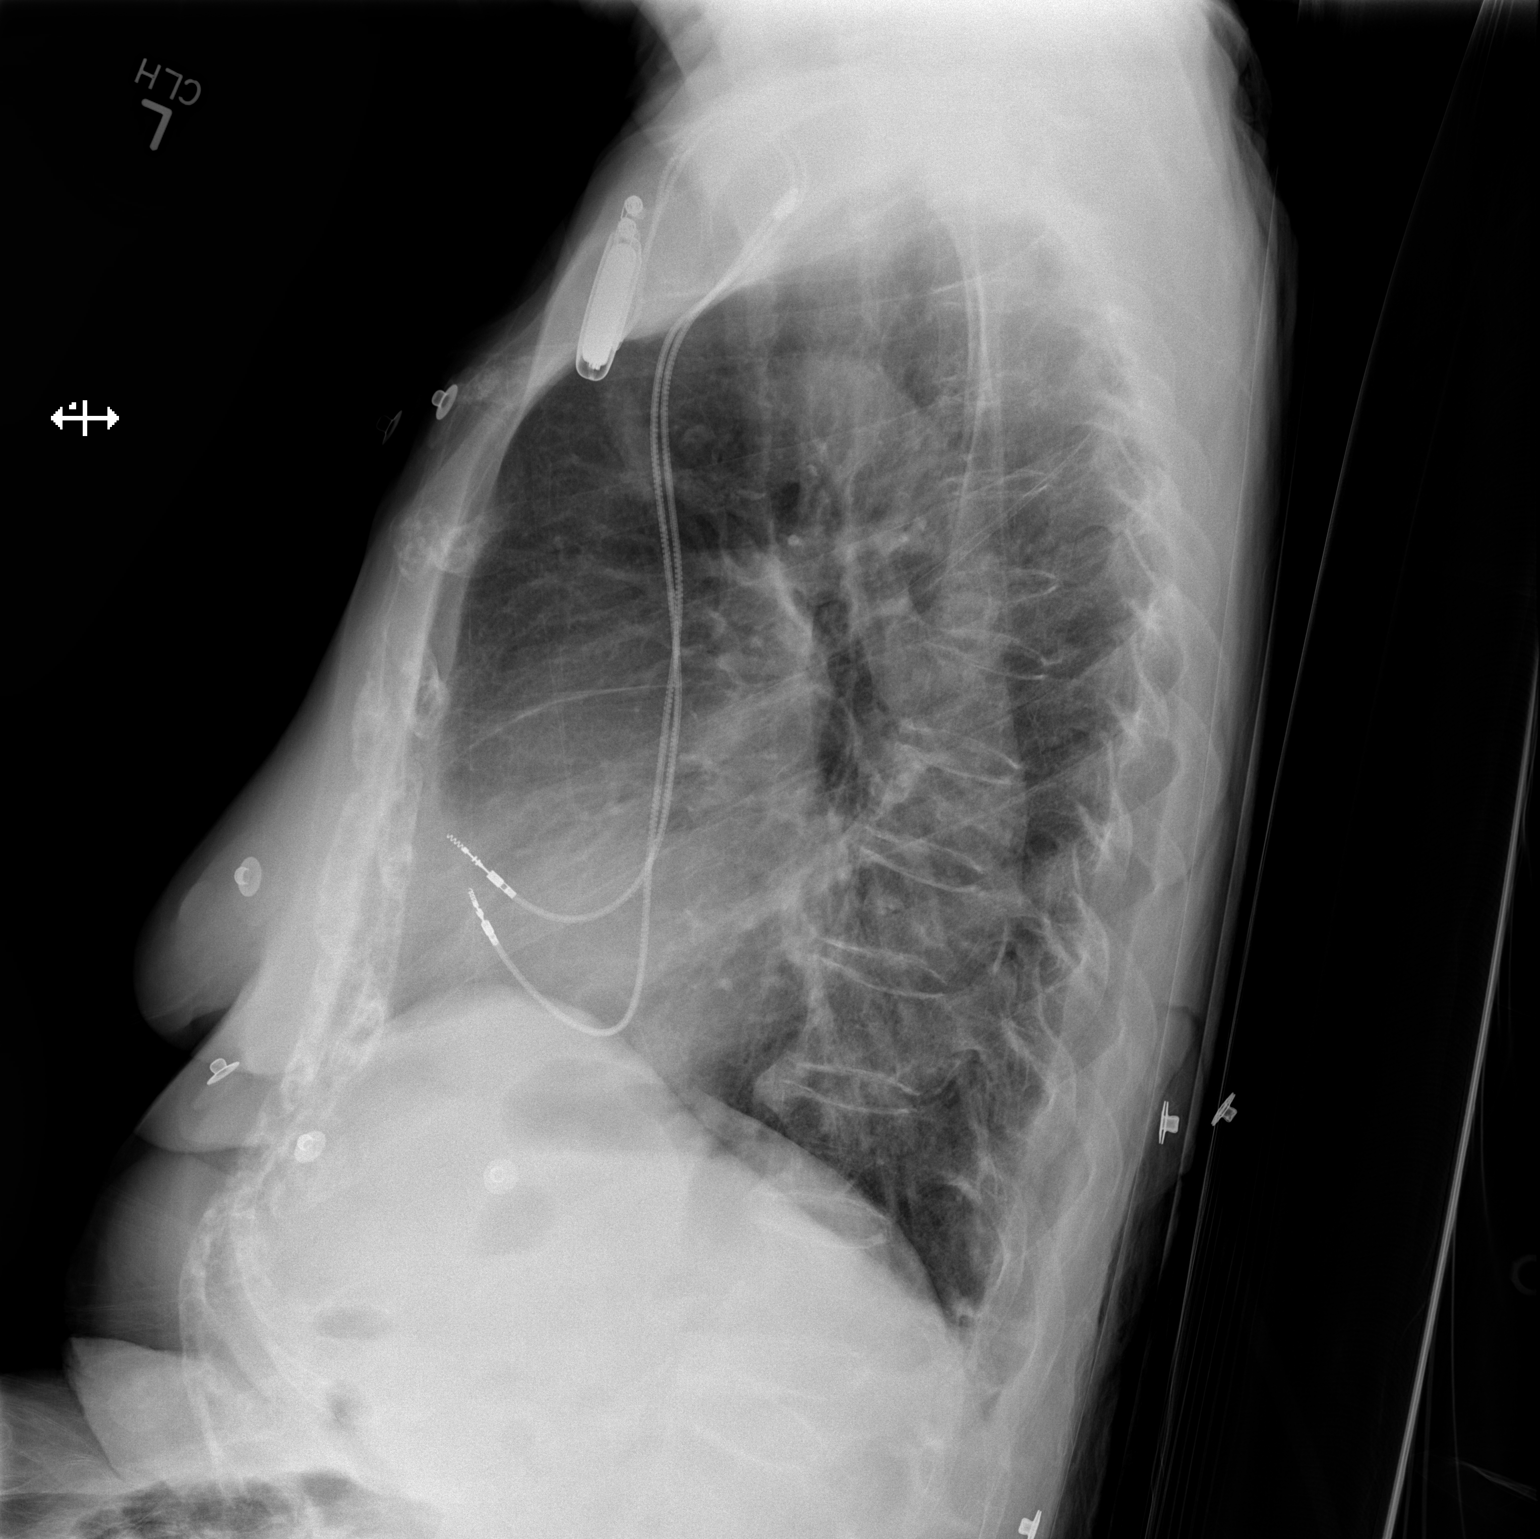

[x chest ap]
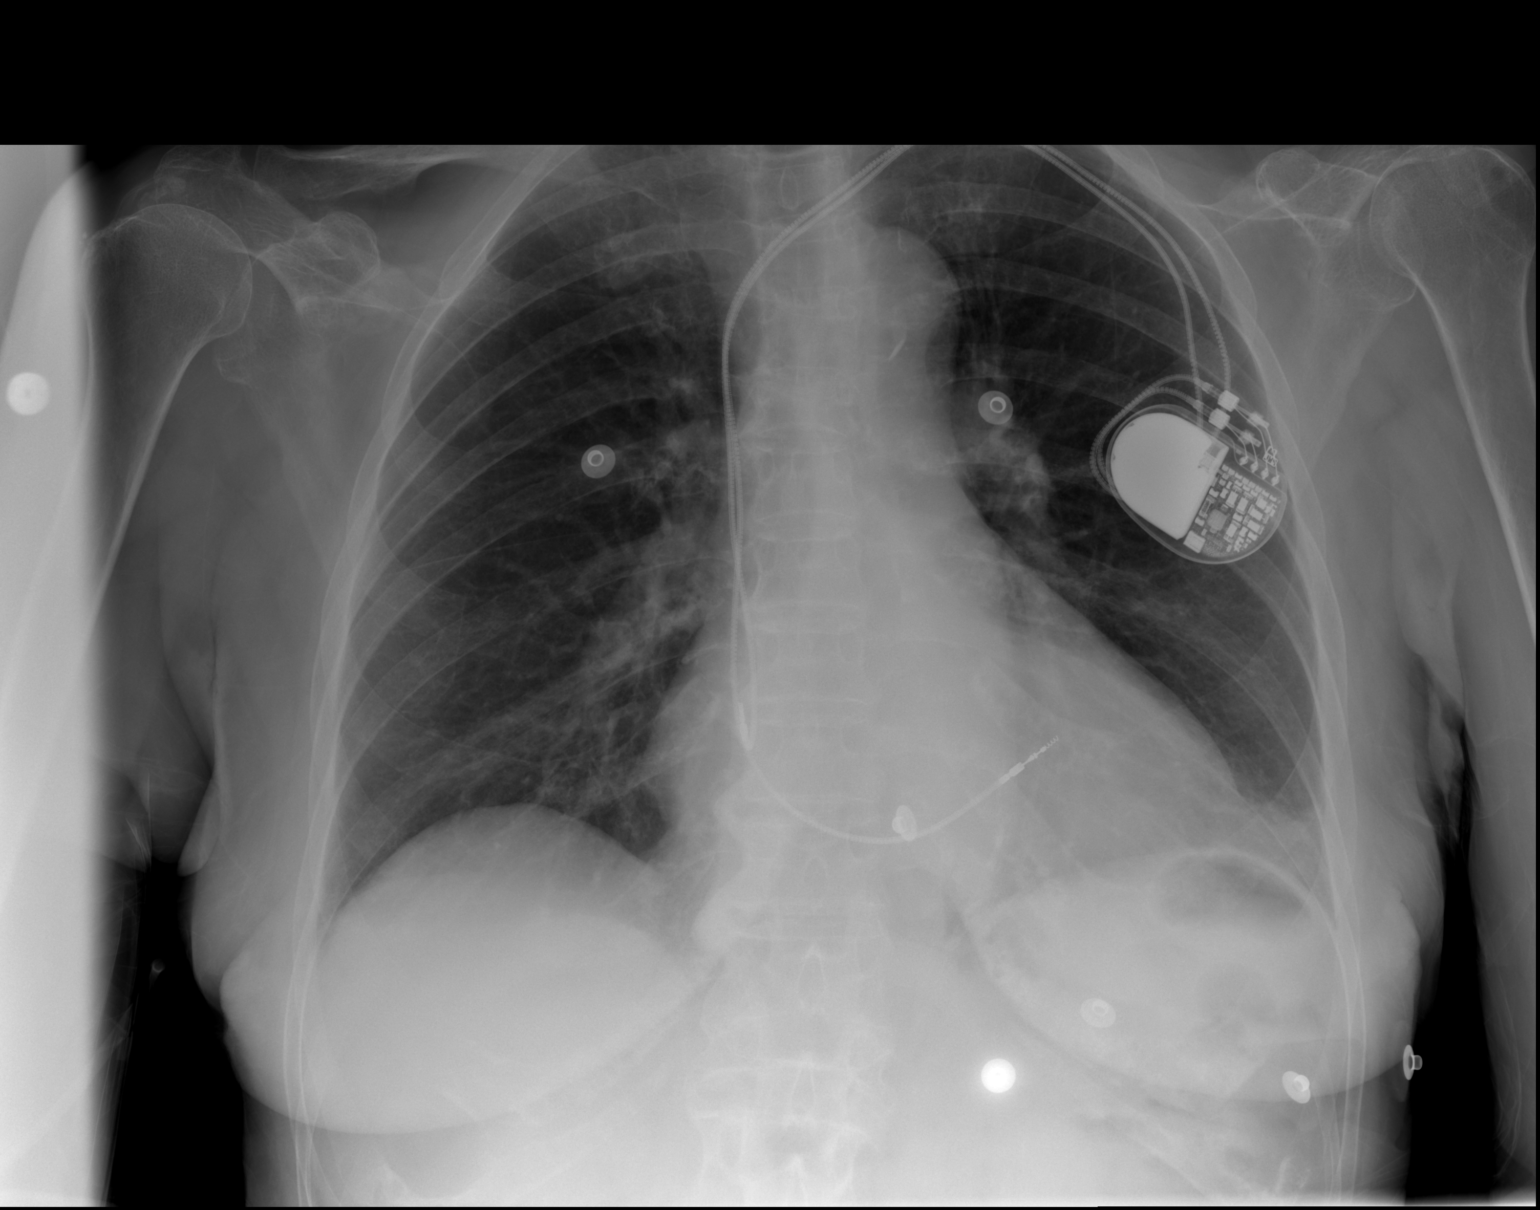

[2 of 2 positions shown; findings below may reference images not displayed]

FINDINGS: Cardiomegaly and left-sided pacemaker again noted.

Patchy left basilar opacity is noted and may represent atelectasis
or airspace disease.

There is no evidence of pneumothorax, pleural effusion or
consolidation.

A 60% T6 compression fracture and 85% L1 compression fracture are
age indeterminate but new since 7222.
IMPRESSION: Patchy left basilar opacity -favor atelectasis over airspace
disease. Consider radiographic follow-up in 2-3 weeks.

T6 and L1 compression fractures of uncertain chronicity but new
since 7222. Correlate clinically.

## 2017-05-08 ENCOUNTER — Emergency Department (HOSPITAL_COMMUNITY): Payer: Medicare Other

## 2017-05-08 ENCOUNTER — Emergency Department (HOSPITAL_COMMUNITY)
Admission: EM | Admit: 2017-05-08 | Discharge: 2017-05-08 | Disposition: A | Discharge status: Home / Self Care | Payer: Medicare Other | Attending: Emergency Medicine | Admitting: Emergency Medicine

## 2017-05-08 ENCOUNTER — Encounter (HOSPITAL_COMMUNITY): Payer: Self-pay | Admitting: Emergency Medicine

## 2017-05-08 DIAGNOSIS — I2510 Atherosclerotic heart disease of native coronary artery without angina pectoris: Secondary | ICD-10-CM | POA: Diagnosis not present

## 2017-05-08 DIAGNOSIS — Z79899 Other long term (current) drug therapy: Secondary | ICD-10-CM | POA: Diagnosis not present

## 2017-05-08 DIAGNOSIS — R05 Cough: Secondary | ICD-10-CM | POA: Diagnosis not present

## 2017-05-08 DIAGNOSIS — F0390 Unspecified dementia without behavioral disturbance: Secondary | ICD-10-CM | POA: Insufficient documentation

## 2017-05-08 DIAGNOSIS — M546 Pain in thoracic spine: Secondary | ICD-10-CM | POA: Diagnosis not present

## 2017-05-08 DIAGNOSIS — Z950 Presence of cardiac pacemaker: Secondary | ICD-10-CM | POA: Diagnosis not present

## 2017-05-08 DIAGNOSIS — I10 Essential (primary) hypertension: Secondary | ICD-10-CM | POA: Insufficient documentation

## 2017-05-08 DIAGNOSIS — Z7982 Long term (current) use of aspirin: Secondary | ICD-10-CM | POA: Diagnosis not present

## 2017-05-08 DIAGNOSIS — R1111 Vomiting without nausea: Secondary | ICD-10-CM | POA: Diagnosis not present

## 2017-05-08 DIAGNOSIS — R059 Cough, unspecified: Secondary | ICD-10-CM

## 2017-05-08 DIAGNOSIS — G8929 Other chronic pain: Secondary | ICD-10-CM | POA: Diagnosis not present

## 2017-05-08 LAB — CBC WITH DIFFERENTIAL/PLATELET
BASOS PCT: 0 %
Basophils Absolute: 0 10*3/uL (ref 0.0–0.1)
Eosinophils Absolute: 0 10*3/uL (ref 0.0–0.7)
Eosinophils Relative: 0 %
HEMATOCRIT: 44.6 % (ref 36.0–46.0)
Hemoglobin: 15.4 g/dL — ABNORMAL HIGH (ref 12.0–15.0)
LYMPHS ABS: 1.8 10*3/uL (ref 0.7–4.0)
Lymphocytes Relative: 19 %
MCH: 31.4 pg (ref 26.0–34.0)
MCHC: 34.5 g/dL (ref 30.0–36.0)
MCV: 90.8 fL (ref 78.0–100.0)
MONOS PCT: 8 %
Monocytes Absolute: 0.8 10*3/uL (ref 0.1–1.0)
NEUTROS ABS: 6.8 10*3/uL (ref 1.7–7.7)
NEUTROS PCT: 73 %
Platelets: 132 10*3/uL — ABNORMAL LOW (ref 150–400)
RBC: 4.91 MIL/uL (ref 3.87–5.11)
RDW: 15 % (ref 11.5–15.5)
WBC: 9.4 10*3/uL (ref 4.0–10.5)

## 2017-05-08 LAB — LIPASE, BLOOD: Lipase: 18 U/L (ref 11–51)

## 2017-05-08 LAB — URINALYSIS, ROUTINE W REFLEX MICROSCOPIC
BILIRUBIN URINE: NEGATIVE
Glucose, UA: NEGATIVE mg/dL
Hgb urine dipstick: NEGATIVE
Ketones, ur: NEGATIVE mg/dL
Leukocytes, UA: NEGATIVE
NITRITE: NEGATIVE
PH: 5 (ref 5.0–8.0)
Protein, ur: NEGATIVE mg/dL
SPECIFIC GRAVITY, URINE: 1.025 (ref 1.005–1.030)

## 2017-05-08 LAB — COMPREHENSIVE METABOLIC PANEL
ALBUMIN: 3.2 g/dL — AB (ref 3.5–5.0)
ALT: 18 U/L (ref 14–54)
AST: 19 U/L (ref 15–41)
Alkaline Phosphatase: 57 U/L (ref 38–126)
Anion gap: 9 (ref 5–15)
BUN: 37 mg/dL — ABNORMAL HIGH (ref 6–20)
CO2: 25 mmol/L (ref 22–32)
Calcium: 10.2 mg/dL (ref 8.9–10.3)
Chloride: 106 mmol/L (ref 101–111)
Creatinine, Ser: 1.3 mg/dL — ABNORMAL HIGH (ref 0.44–1.00)
GFR calc Af Amer: 41 mL/min — ABNORMAL LOW (ref >60)
GFR calc non Af Amer: 35 mL/min — ABNORMAL LOW (ref >60)
GLUCOSE: 122 mg/dL — AB (ref 65–99)
POTASSIUM: 4.2 mmol/L (ref 3.5–5.1)
SODIUM: 140 mmol/L (ref 135–145)
Total Bilirubin: 0.8 mg/dL (ref 0.3–1.2)
Total Protein: 6.4 g/dL — ABNORMAL LOW (ref 6.5–8.1)

## 2017-05-08 LAB — I-STAT TROPONIN, ED: Troponin i, poc: 0.01 ng/mL (ref 0.00–0.08)

## 2017-05-08 NOTE — ED Provider Notes (Signed)
[facility] EMERGENCY DEPARTMENT Provider Note   CSN: [id number] Arrival date & time: 05/08/17  2017     History   Chief Complaint Chief Complaint  Patient presents with  . Cough    HPI [name] is a 84 y.o. female.  Level V caveat: Dementia.   [name] is a 84 y.o. Female who presents to the emergency department from home via EMS with her daughter and granddaughter who reported the patient had an episode of vomiting tonight.  They report the patient ate some of her dinner tonight and then immediately began vomiting up some mucus.  They report there was no food contents in her vomit.  They report she had no trouble breathing during this time.  The reports she has had may be a slight cough intermittently that is not out of the norm for the patient.  Patient denies any new complaints.  She complains of her ongoing right upper back pain that is been present for many years.  Family reports that she has a history of vascular dementia, but has seemed more confused than normal recently.  She has chronic loose stools.  No changes to her bowel habits.  No changes to her urination.  She has had no abdominal complaints and no previous abdominal surgeries.  No fevers, trouble breathing, wheezing, abdominal pain, hematemesis, chest pain, rashes, urinary symptoms.    The history is provided by the patient, medical records and a relative. No language interpreter was used.  Cough  Pertinent negatives include no chest pain, no chills, no headaches, no sore throat, no shortness of breath and no wheezing.    Past Medical History:  Diagnosis Date  . Allergic rhinitis   . Anxiety   . Arthritis    Left AC joint  . Complete heart block (HCC)    a. s/p MDT dual chamber pacemaker  . Complication of anesthesia    states allergy to a med used with pacemaker - made her wild  . Compression fracture of L1 lumbar vertebra (HCC)   . Depression    situational  .  Facial rash   . Fibrocystic disease of breast   . GI bleed    a. while on ASA  . Hypertension   . Microscopic hematuria    with negative workup   . Seborrheic keratoses   . Stroke ([place])   . Vision abnormalities     Patient Active Problem List   Diagnosis Date Noted  . Cerebrovascular accident (CVA) due to embolism of left posterior cerebral artery ([place]) 02/11/2015  . AVB (atrioventricular block) 02/11/2015  . Cardiac pacemaker in situ 02/11/2015  . Cerebral infarction due to embolism of left posterior cerebral artery ([place])   . Acute ischemic stroke ([place])   . Acute upper GI bleed   . Acute blood loss anemia   . Acute gastric ulcer   . Accelerated hypertension   . Dyslipidemia 12/20/2014  . Bleeding gastrointestinal   . Renal insufficiency   . Faintness   . Chronic headaches 06/28/2014  . Bilateral lower extremity edema 06/28/2014  . CAD (coronary artery disease), CAth 04/2004 non obst 03/22/2014  . Essential hypertension 11/21/2013  . Pacemaker 05/19/2013  . Complete heart block s/p PPM 2009 05/19/2013    Past Surgical History:  Procedure Laterality Date  . EP IMPLANTABLE DEVICE [name] 05/29/2015   Procedure: PPM/BIV PPM Generator Changeout;  Surgeon: Evans Lance, MD;  Location: [facility] LAB;  Service: Cardiovascular;  Laterality: [name];  . ESOPHAGOGASTRODUODENOSCOPY Left 03/22/2014   Procedure: ESOPHAGOGASTRODUODENOSCOPY (EGD);  Surgeon: Arta Silence, MD;  Location: [facility] ENDOSCOPY;  Service: Endoscopy;  Laterality: Left;  . ESOPHAGOGASTRODUODENOSCOPY [name] 12/20/2014   Procedure: ESOPHAGOGASTRODUODENOSCOPY (EGD);  Surgeon: Teena Irani, MD;  Location: [facility] ENDOSCOPY;  Service: Endoscopy;  Laterality: [name];  . INSERT / REPLACE / REMOVE PACEMAKER      OB History    No data available       Home Medications    Prior to Admission medications   Medication Sig Start Date End Date Taking? Authorizing Provider  Alendronate Sodium (FOSAMAX PO) Take 1 tablet by mouth once a  week. On Wednesday   Yes [provider]  amLODipine (NORVASC) 5 MG tablet Take 2.5 mg by mouth daily.   Yes [provider]  Ascorbic Acid (VITAMIN C PO) Take 1 tablet by mouth daily.   Yes [provider]  aspirin EC 81 MG tablet Take 81 mg by mouth every other day.    Yes [provider]  Calcium Carb-Cholecalciferol (CALCIUM 600+D3) 600-800 MG-UNIT TABS Take 1 tablet by mouth daily.   Yes [provider]  carvedilol (COREG) 6.25 MG tablet Take 1 tablet (6.25 mg total) by mouth 2 (two) times daily. 07/29/16  Yes Evans Lance, MD  cholecalciferol (VITAMIN D) 400 UNITS TABS tablet Take 2,000 Units by mouth daily.    Yes [provider]  Ciprofloxacin (CIPRO PO) Take 1 tablet by mouth 2 (two) times daily.   Yes [provider]  clonazePAM (KLONOPIN) 1 MG tablet Take 1 mg by mouth daily.   Yes [provider]  ferrous sulfate 325 (65 FE) MG tablet Take 325 mg by mouth every other day.    Yes [provider]  flecainide (TAMBOCOR) 50 MG tablet Take 1 tablet (50 mg total) by mouth 2 (two) times daily. 07/29/16  Yes Evans Lance, MD  losartan (COZAAR) 25 MG tablet Take 25 mg by mouth daily.   Yes [provider]  Multiple Vitamin (MULTIVITAMIN) capsule Take 1 capsule by mouth daily.   Yes [provider]  Multiple Vitamins-Minerals (OCUVITE PRESERVISION PO) Take 1 tablet by mouth 2 (two) times daily.   Yes [provider]  Omega-3 Fatty Acids (FISH OIL) 1200 MG CAPS Take 1,200 mg by mouth daily.   Yes [provider]  omeprazole (PRILOSEC) 40 MG capsule Take 40 mg by mouth every Monday, Wednesday, and Friday.    Yes [provider]  triamterene-hydrochlorothiazide (MAXZIDE-25) 37.5-25 MG tablet Take 0.5 tablets by mouth daily.    Yes [provider]  vitamin B-12 (CYANOCOBALAMIN) 500 MCG tablet Take 500 mcg by mouth daily.   Yes [provider]  cephALEXin  (KEFLEX) 500 MG capsule Take 1 capsule (500 mg total) by mouth 4 (four) times daily. Patient not taking: Reported on 05/08/2017 02/24/17   Valarie Merino, MD    Family History Family History  Problem Relation Age of Onset  . Diabetes Mother   . Stroke Mother   . Coronary artery disease Mother   . Cancer - Lung Father   . Hypertension Sister   . Hypertension Brother   . Diabetes Brother   . Cancer Brother   . Hypertension Brother   . Diabetes Brother   . Cancer Brother     Social History Social History   Tobacco Use  . Smoking status: Never Smoker  . Smokeless tobacco: Never Used  Substance Use Topics  .  Alcohol use: No  . Drug use: No     Allergies   Clindamycin/lincomycin; Antihistamines, chlorpheniramine-type; Ativan [lorazepam]; Imiquimod; Lisinopril; Micardis hct [telmisartan-hctz]; Strawberry flavor; Thiazide-type diuretics; and Ultracet [tramadol-acetaminophen]   Review of Systems Review of Systems  Constitutional: Negative for chills and fever.  HENT: Negative for congestion and sore throat.   Eyes: Negative for visual disturbance.  Respiratory: Positive for cough. Negative for shortness of breath and wheezing.   Cardiovascular: Negative for chest pain and palpitations.  Gastrointestinal: Positive for vomiting. Negative for abdominal pain, diarrhea and nausea.  Genitourinary: Negative for difficulty urinating and dysuria.  Musculoskeletal: Positive for back pain (chronic ). Negative for neck pain.  Skin: Negative for rash.  Neurological: Negative for headaches.     Physical Exam Updated Vital Signs BP (!) 151/63   Pulse 68   Temp 98.8 F (37.1 C) (Oral)   Resp 17   SpO2 98%   Physical Exam  Constitutional: She appears well-developed and well-nourished. No distress.  Nontoxic-appearing.  HENT:  Head: Normocephalic and atraumatic.  Mouth/Throat: Oropharynx is clear and moist.  Throat is clear.  Eyes: Conjunctivae are normal. Pupils are equal,  round, and reactive to light. Right eye exhibits no discharge. Left eye exhibits no discharge.  Neck: Neck supple.  Cardiovascular: Normal rate, normal heart sounds and intact distal pulses.  Pulmonary/Chest: Effort normal. No stridor. No respiratory distress. She has no wheezes. She has no rales.  Crackles noted bilaterally.  No increased work of breathing.  Symmetric chest expansion bilaterally.  Abdominal: Soft. Bowel sounds are normal. She exhibits no mass. There is no tenderness. There is no guarding.  Abdomen is soft and nontender to palpation.  Musculoskeletal: She exhibits no edema.  Lymphadenopathy:    She has no cervical adenopathy.  Neurological: She is alert. Coordination normal.  Alert and oriented to person and place only.  Skin: Skin is warm and dry. No rash noted. She is not diaphoretic. No erythema. No pallor.  Psychiatric: She has a normal mood and affect. Her behavior is normal.  Nursing note and vitals reviewed.    ED Treatments / Results  Labs (all labs ordered are listed, but only abnormal results are displayed) Labs Reviewed  COMPREHENSIVE METABOLIC PANEL - Abnormal; Notable for the following components:      Result Value   Glucose, Bld 122 (*)    BUN 37 (*)    Creatinine, Ser 1.30 (*)    Total Protein 6.4 (*)    Albumin 3.2 (*)    GFR calc non Af Amer 35 (*)    GFR calc Af Amer 41 (*)    All other components within normal limits  CBC WITH DIFFERENTIAL/PLATELET - Abnormal; Notable for the following components:   Hemoglobin 15.4 (*)    Platelets 132 (*)    All other components within normal limits  LIPASE, BLOOD  URINALYSIS, ROUTINE W REFLEX MICROSCOPIC  I-STAT TROPONIN, ED    EKG  EKG Interpretation  Date/Time:  Sunday May 08 2017 21:32:32 EST Ventricular Rate:  76 PR Interval:    QRS Duration: 132 QT Interval:  434 QTC Calculation: 498 R Axis:   78 Text Interpretation:  A-V dual-paced complexes w/ some inhibition multiple pvcs similar  pattern to prior 3/17 Confirmed by Aletta Edouard [phone]) on 05/08/2017 9:46:55 PM       Radiology Dg Chest 2 View  Result Date: 05/08/2017 CLINICAL DATA:  Cough and vomiting EXAM: CHEST  2 VIEW COMPARISON:  02/24/2017 chest radiograph. FINDINGS: Stable  configuration of 2 lead left subclavian pacemaker. Stable cardiomediastinal silhouette with mild cardiomegaly and aortic atherosclerosis. No pneumothorax. No pleural effusion. Slightly low lung volumes. No pulmonary edema. Mild left basilar scarring versus atelectasis. No acute consolidative airspace disease. IMPRESSION: Mild left basilar scarring versus atelectasis. Stable mild cardiomegaly without pulmonary edema. Electronically Signed   By: Ilona Sorrel M.D.   On: 05/08/2017 22:24    Procedures Procedures (including critical care time)  Medications Ordered in ED Medications - No data to display   Initial Impression / Assessment and Plan / ED Course  I have reviewed the triage vital signs and the nursing notes.  Pertinent labs & imaging results that were available during my care of the patient were reviewed by me and considered in my medical decision making (see chart for details).  Clinical Course as of May 08 2241  Nancy Fetter May 08, 7736  988 84 year old female brought in by family for being more somnolent all day today and then tonight they got her up to eat dinner and she coughed or vomited up a bunch of mucus.  Patient is a very poor historian.  Her only complaint is her right sided thoracic back pain with been there for years per family.  Heart regular rate and rhythm lungs are clear abdomen is soft.  We are checking some screening labs EKG chest x-ray.  [MB]    Clinical Course User Index [MB] Hayden Rasmussen, MD   This is a 84 y.o. Female who presents to the emergency department from home via EMS with her daughter and granddaughter who reported the patient had an episode of vomiting tonight.  They report the patient ate some of her  dinner tonight and then immediately began vomiting up some mucus.  They report there was no food contents in her vomit.  They report she had no trouble breathing during this time.  The reports she has had may be a slight cough intermittently that is not out of the norm for the patient.  Patient denies any new complaints.  She complains of her ongoing right upper back pain that is been present for many years.  Family reports that she has a history of vascular dementia, but has seemed more confused than normal recently.  She has chronic loose stools.  No changes to her bowel habits.  No changes to her urination.  She has had no abdominal complaints and no previous abdominal surgeries. On exam the patient is afebrile and nontoxic-appearing.  She is alert and oriented to person and place only.  Speech is clear and coherent.  Abdomen is soft and nontender.  She has slight crackles noted bilaterally on lung exam.  No increased work of breathing.  No rales or rhonchi. EKG is generally unchanged from her last tracing.  Troponin is not elevated.  CMP shows a creatinine around her baseline of 1.30.  Lipase is within normal limits.  CBC is unremarkable.  No leukocytosis.  Urinalysis without sign of infection.  Chest x-ray is unremarkable. At reevaluation patient is resting comfortably with her family.  She is drinking ginger ale without any difficulty.  We discussed test results.  Workup here is reassuring.  Unsure to the cause of this episode of vomiting mucus.  Her abdomen is soft and non-tender.  She is now tolerating p.o.  Will discharge with close follow-up by PCP.  Return precautions discussed.  I encouraged him to follow-up with their primary care doctor next week.  This patient was discussed with  and evaluated by Dr. Melina Copa who agrees with assessment and plan.   Final Clinical Impressions(s) / ED Diagnoses   Final diagnoses:  Non-intractable vomiting without nausea, unspecified vomiting type  Cough    ED  Discharge Orders    None       Sharmaine Base 05/08/17 2245    Hayden Rasmussen, MD 05/08/17 2312

## 2017-05-08 NOTE — ED Notes (Signed)
Patient transported to X-ray 

## 2017-05-08 NOTE — ED Triage Notes (Signed)
Per [facility], Pt from home with family. Pt complains of cough for 2 weeks. Pt also reports 1 episode of vomiting tonight. Pt of complaining of pain to upper R back. Pt has medication patch in place to back. Pt alert and oriented, in no acute distress at this time.

## 2017-05-12 ENCOUNTER — Emergency Department (HOSPITAL_COMMUNITY): Payer: Medicare Other

## 2017-05-12 ENCOUNTER — Emergency Department (HOSPITAL_COMMUNITY)
Admission: EM | Admit: 2017-05-12 | Discharge: 2017-05-12 | Disposition: A | Discharge status: Home / Self Care | Payer: Medicare Other | Attending: Emergency Medicine | Admitting: Emergency Medicine

## 2017-05-12 DIAGNOSIS — S199XXA Unspecified injury of neck, initial encounter: Secondary | ICD-10-CM | POA: Diagnosis not present

## 2017-05-12 DIAGNOSIS — T148XXA Other injury of unspecified body region, initial encounter: Secondary | ICD-10-CM | POA: Diagnosis not present

## 2017-05-12 DIAGNOSIS — M25561 Pain in right knee: Secondary | ICD-10-CM | POA: Insufficient documentation

## 2017-05-12 DIAGNOSIS — I10 Essential (primary) hypertension: Secondary | ICD-10-CM | POA: Diagnosis not present

## 2017-05-12 DIAGNOSIS — Z79899 Other long term (current) drug therapy: Secondary | ICD-10-CM | POA: Diagnosis not present

## 2017-05-12 DIAGNOSIS — R51 Headache: Secondary | ICD-10-CM | POA: Insufficient documentation

## 2017-05-12 DIAGNOSIS — Z7982 Long term (current) use of aspirin: Secondary | ICD-10-CM | POA: Diagnosis not present

## 2017-05-12 DIAGNOSIS — S0990XA Unspecified injury of head, initial encounter: Secondary | ICD-10-CM | POA: Diagnosis not present

## 2017-05-12 DIAGNOSIS — I119 Hypertensive heart disease without heart failure: Secondary | ICD-10-CM | POA: Insufficient documentation

## 2017-05-12 DIAGNOSIS — W19XXXA Unspecified fall, initial encounter: Secondary | ICD-10-CM

## 2017-05-12 DIAGNOSIS — Y92009 Unspecified place in unspecified non-institutional (private) residence as the place of occurrence of the external cause: Secondary | ICD-10-CM

## 2017-05-12 DIAGNOSIS — Z950 Presence of cardiac pacemaker: Secondary | ICD-10-CM | POA: Diagnosis not present

## 2017-05-12 DIAGNOSIS — R001 Bradycardia, unspecified: Secondary | ICD-10-CM | POA: Diagnosis not present

## 2017-05-12 DIAGNOSIS — M542 Cervicalgia: Secondary | ICD-10-CM | POA: Diagnosis not present

## 2017-05-12 DIAGNOSIS — S8991XA Unspecified injury of right lower leg, initial encounter: Secondary | ICD-10-CM | POA: Diagnosis not present

## 2017-05-12 LAB — BASIC METABOLIC PANEL
ANION GAP: 11 (ref 5–15)
BUN: 31 mg/dL — AB (ref 6–20)
CO2: 24 mmol/L (ref 22–32)
Calcium: 10.2 mg/dL (ref 8.9–10.3)
Chloride: 106 mmol/L (ref 101–111)
Creatinine, Ser: 1.06 mg/dL — ABNORMAL HIGH (ref 0.44–1.00)
GFR calc Af Amer: 52 mL/min — ABNORMAL LOW (ref >60)
GFR calc non Af Amer: 45 mL/min — ABNORMAL LOW (ref >60)
GLUCOSE: 106 mg/dL — AB (ref 65–99)
POTASSIUM: 4.2 mmol/L (ref 3.5–5.1)
Sodium: 141 mmol/L (ref 135–145)

## 2017-05-12 LAB — URINALYSIS, ROUTINE W REFLEX MICROSCOPIC
Bilirubin Urine: NEGATIVE
Glucose, UA: NEGATIVE mg/dL
Hgb urine dipstick: NEGATIVE
KETONES UR: NEGATIVE mg/dL
LEUKOCYTES UA: NEGATIVE
NITRITE: NEGATIVE
PH: 5 (ref 5.0–8.0)
Protein, ur: NEGATIVE mg/dL
SPECIFIC GRAVITY, URINE: 1.025 (ref 1.005–1.030)

## 2017-05-12 LAB — I-STAT CHEM 8, ED
BUN: 34 mg/dL — AB (ref 6–20)
CHLORIDE: 105 mmol/L (ref 101–111)
Calcium, Ion: 1.35 mmol/L (ref 1.15–1.40)
Creatinine, Ser: 1 mg/dL (ref 0.44–1.00)
Glucose, Bld: 105 mg/dL — ABNORMAL HIGH (ref 65–99)
HEMATOCRIT: 47 % — AB (ref 36.0–46.0)
Hemoglobin: 16 g/dL — ABNORMAL HIGH (ref 12.0–15.0)
POTASSIUM: 4 mmol/L (ref 3.5–5.1)
SODIUM: 141 mmol/L (ref 135–145)
TCO2: 28 mmol/L (ref 22–32)

## 2017-05-12 LAB — CBC
HEMATOCRIT: 48 % — AB (ref 36.0–46.0)
HEMOGLOBIN: 16.3 g/dL — AB (ref 12.0–15.0)
MCH: 31.5 pg (ref 26.0–34.0)
MCHC: 34 g/dL (ref 30.0–36.0)
MCV: 92.7 fL (ref 78.0–100.0)
Platelets: 122 10*3/uL — ABNORMAL LOW (ref 150–400)
RBC: 5.18 MIL/uL — ABNORMAL HIGH (ref 3.87–5.11)
RDW: 15.3 % (ref 11.5–15.5)
WBC: 12.3 10*3/uL — ABNORMAL HIGH (ref 4.0–10.5)

## 2017-05-12 LAB — I-STAT TROPONIN, ED: Troponin i, poc: 0 ng/mL (ref 0.00–0.08)

## 2017-05-12 LAB — CBG MONITORING, ED: GLUCOSE-CAPILLARY: 96 mg/dL (ref 65–99)

## 2017-05-12 MED ORDER — SODIUM CHLORIDE 0.9 % IV BOLUS (SEPSIS)
500.0000 mL | Freq: Once | INTRAVENOUS | Status: AC
  Administered 2017-05-12: 500 mL via INTRAVENOUS

## 2017-05-12 NOTE — ED Notes (Signed)
Patient denies pain and is resting comfortably.  

## 2017-05-12 NOTE — ED Notes (Signed)
CBG: 96. RN notified. 

## 2017-05-12 NOTE — ED Notes (Signed)
Pt ambulated in hallway  approx 500 ft with minimal standby assist. Pt encouraged to always use walker or assistive device to prevent future falls.

## 2017-05-12 NOTE — Discharge Instructions (Signed)
You have been evaluated for your recent fall.  No significant signs of injury during this visit.  We will help schedule home health aid to further assist you at home.

## 2017-05-12 NOTE — ED Triage Notes (Signed)
Patient fell while standing still, according to daughter. Per EMS patient states right knee pain and left sided facial abrasion, cheek area.

## 2017-05-12 NOTE — ED Provider Notes (Signed)
[facility] ] Baseline    Rhythm strip brady  V pace - is it not picking up approrpiately   Loletha Grayer true --- admitted  Rep is coming to get pace evaluted   Plan Dc wit home health if no true brady  If not normal admit   Care assumed from  Domenic Moras, PA-C at shift change with pacemaker interrogation pending.   In brief, this patient is a 84 y.o. F who presents after fall. Please see note from previous provider for full H&P.   PLAN: Patient has ambulated in the department without any difficulty. Workup has been unremarkable so far. When supervising attending went to re-evaluate pateint, there was concern about some bradycardia. If pacemaker interrogation is negative plan for discharge. If there is concern for bradycardia, recommend admission for further workup.   MDM:  Pacemaker interrogation reveal no arrhythmias. Patient had one episode of bradycardia in the 60s but otherwise had appropriate pace firing. Discussed results with patient an family. Vitals stable. Home Health order has been initiated by previous provider. Patient had ample opportunity for questions and discussion. All patient's questions were answered with full understanding. Strict return precautions discussed. Patient expresses understanding and agreement to plan.     1. Fall at home, initial encounter      [name] 05-17-2017 Conni Elliot    Noemi Chapel, MD 2017/05/17 1455

## 2017-05-12 NOTE — ED Triage Notes (Signed)
Family left and unable to reach Family by phone

## 2017-05-12 NOTE — Care Management Note (Signed)
Case Management Note  Patient Details  Name: [name] MRN: [id number] Date of Birth: 11/08/1924  CM contacted by Georgina Snell with [facility] who reports they were able to accept the pt for [facility].  Contacted Dr. Billy Fischer for HHS orders and to advised of acceptance.  No further CM needs noted at this time.  Expected Discharge Date:   05/12/2017               Expected Discharge Plan:  [place]  Discharge planning Services  CM Consult  Post Acute Care Choice:  Home Health Choice offered to:  Patient, Adult Children  HH Arranged:  RN, PT, PCS/Personal Care Services, Nurse's Aide, Social Work, OT [place] Agency:  [place]  Status of Service:  Completed, signed off  Taiki Buckwalter, Benjaman Lobe, RN 05/12/2017, 3:02 PM

## 2017-05-12 NOTE — ED Provider Notes (Signed)
[facility] EMERGENCY DEPARTMENT Provider Note   CSN: [id number] Arrival date & time: 05/12/17  1031     History   Chief Complaint Chief Complaint  Patient presents with  . Fall    HPI [name] is a 84 y.o. female.  HPI   84 year old female with history of complete heart block status post MDT dual chamber pacemaker, prior stroke, vision problem, history of GI bleed brought in via EMS from home for evaluation of a fall.  Patient reports she is normally wheelchair bound.  She was on her way to the bathroom and while trying to get up from the wheelchair, she fell forward and struck her knee against the ground and hits her forehead.  She denies any loss of consciousness but was on the ground for approximately 30 minutes until her daughter arrived home and found patient on the floor.  She denies any precipitating symptoms prior to the fall.  She is complaining of mild pain to her right knee and mild headache.  No complaints of neck pain, URI symptoms, chest pain, trouble breathing, abdominal pain, back pain, or pain to her other extremities.  She is not on any blood thinner medication.  Past Medical History:  Diagnosis Date  . Allergic rhinitis   . Anxiety   . Arthritis    Left AC joint  . Complete heart block (HCC)    a. s/p MDT dual chamber pacemaker  . Complication of anesthesia    states allergy to a med used with pacemaker - made her wild  . Compression fracture of L1 lumbar vertebra (HCC)   . Depression    situational  . Facial rash   . Fibrocystic disease of breast   . GI bleed    a. while on ASA  . Hypertension   . Microscopic hematuria    with negative workup   . Seborrheic keratoses   . Stroke ([place])   . Vision abnormalities     Patient Active Problem List   Diagnosis Date Noted  . Cerebrovascular accident (CVA) due to embolism of left posterior cerebral artery ([place]) 02/11/2015  . AVB (atrioventricular block) 02/11/2015  . Cardiac  pacemaker in situ 02/11/2015  . Cerebral infarction due to embolism of left posterior cerebral artery ([place])   . Acute ischemic stroke ([place])   . Acute upper GI bleed   . Acute blood loss anemia   . Acute gastric ulcer   . Accelerated hypertension   . Dyslipidemia 12/20/2014  . Bleeding gastrointestinal   . Renal insufficiency   . Faintness   . Chronic headaches 06/28/2014  . Bilateral lower extremity edema 06/28/2014  . CAD (coronary artery disease), CAth 04/2004 non obst 03/22/2014  . Essential hypertension 11/21/2013  . Pacemaker 05/19/2013  . Complete heart block s/p PPM 2009 05/19/2013    Past Surgical History:  Procedure Laterality Date  . EP IMPLANTABLE DEVICE [name] 05/29/2015   Procedure: PPM/BIV PPM Generator Changeout;  Surgeon: Evans Lance, MD;  Location: [facility];  Service: Cardiovascular;  Laterality: [name];  . ESOPHAGOGASTRODUODENOSCOPY Left 03/22/2014   Procedure: ESOPHAGOGASTRODUODENOSCOPY (EGD);  Surgeon: Arta Silence, MD;  Location[facility] ENDOSCOPY;  Service: Endoscopy;  Laterality: Left;  . ESOPHAGOGASTRODUODENOSCOPY [name] 12/20/2014   Procedure: ESOPHAGOGASTRODUODENOSCOPY (EGD);  Surgeon: Teena Irani, MD;  Location: [facility] ENDOSCOPY;  Service: Endoscopy;  Laterality: [name];  . INSERT / REPLACE / REMOVE PACEMAKER      OB History    No data available  Home Medications    Prior to Admission medications   Medication Sig Start Date End Date Taking? Authorizing Provider  Alendronate Sodium (FOSAMAX PO) Take 1 tablet by mouth once a week. On Wednesday    [provider]  amLODipine (NORVASC) 5 MG tablet Take 2.5 mg by mouth daily.    [provider]  Ascorbic Acid (VITAMIN C PO) Take 1 tablet by mouth daily.    [provider]  aspirin EC 81 MG tablet Take 81 mg by mouth every other day.     [provider]  Calcium Carb-Cholecalciferol (CALCIUM 600+D3) 600-800 MG-UNIT TABS Take 1 tablet by mouth daily.    [provider]  carvedilol (COREG) 6.25 MG tablet Take 1 tablet (6.25 mg total) by mouth 2 (two) times daily. 07/29/16   Evans Lance, MD  cephALEXin (KEFLEX) 500 MG capsule Take 1 capsule (500 mg total) by mouth 4 (four) times daily. Patient not taking: Reported on 05/08/2017 02/24/17   Valarie Merino, MD  cholecalciferol (VITAMIN D) 400 UNITS TABS tablet Take 2,000 Units by mouth daily.     [provider]  Ciprofloxacin (CIPRO PO) Take 1 tablet by mouth 2 (two) times daily.    [provider]  clonazePAM (KLONOPIN) 1 MG tablet Take 1 mg by mouth daily.    [provider]  ferrous sulfate 325 (65 FE) MG tablet Take 325 mg by mouth every other day.     [provider]  flecainide (TAMBOCOR) 50 MG tablet Take 1 tablet (50 mg total) by mouth 2 (two) times daily. 07/29/16   Evans Lance, MD  losartan (COZAAR) 25 MG tablet Take 25 mg by mouth daily.    [provider]  Multiple Vitamin (MULTIVITAMIN) capsule Take 1 capsule by mouth daily.    [provider]  Multiple Vitamins-Minerals (OCUVITE PRESERVISION PO) Take 1 tablet by mouth 2 (two) times daily.    [provider]  Omega-3 Fatty Acids (FISH OIL) 1200 MG CAPS Take 1,200 mg by mouth daily.    [provider]  omeprazole (PRILOSEC) 40 MG capsule Take 40 mg by mouth every Monday, Wednesday, and Friday.     [provider]  triamterene-hydrochlorothiazide (MAXZIDE-25) 37.5-25 MG tablet Take 0.5 tablets by mouth daily.     [provider]  vitamin B-12 (CYANOCOBALAMIN) 500 MCG tablet Take 500 mcg by mouth daily.    [provider]    Family History Family History  Problem Relation Age of Onset  . Diabetes Mother   . Stroke Mother   . Coronary artery disease Mother   . Cancer - Lung Father   . Hypertension Sister   . Hypertension Brother   . Diabetes Brother   . Cancer Brother   . Hypertension Brother   . Diabetes Brother   . Cancer  Brother     Social History Social History   Tobacco Use  . Smoking status: Never Smoker  . Smokeless tobacco: Never Used  Substance Use Topics  . Alcohol use: No  . Drug use: No     Allergies   Clindamycin/lincomycin; Antihistamines, chlorpheniramine-type; Ativan [lorazepam]; Imiquimod; Lisinopril; Micardis hct [telmisartan-hctz]; Strawberry flavor; Thiazide-type diuretics; and Ultracet [tramadol-acetaminophen]   Review of Systems Review of Systems  All other systems reviewed and are negative.    Physical Exam Updated Vital Signs BP (!) 131/112 (BP Location: Right Arm)   Pulse 74   Temp 98.6 F (37 C) (Oral)   Resp Marland Kitchen)  22   SpO2 96%   Physical Exam  Constitutional: She appears well-developed and well-nourished. No distress.  Elderly female resting comfortably in bed in no acute discomfort.  HENT:  Head: Atraumatic.  No scalp tenderness, no face tenderness, no hemotympanum, septal hematoma, malocclusion, or midface tenderness  Eyes: Conjunctivae and EOM are normal. Pupils are equal, round, and reactive to light.  Neck: Normal range of motion. Neck supple.  No cervical midline spine tenderness  Cardiovascular: Normal rate and regular rhythm.  Pulmonary/Chest: Effort normal and breath sounds normal. No respiratory distress. She has no rales.  Abdominal: Soft. She exhibits no distension. There is no tenderness.  Musculoskeletal: She exhibits tenderness (Right knee is mildly tender to the anterior knee with normal knee flexion and extension, no bruising or crepitus noted.).  Neurological: She is alert.  5 out of 5 strength to all 4 extremities with intact distal pulses.  Patient is alert to self, and place but mistakenly thought it is October, and the year is 25.  Skin: No rash noted.  Psychiatric: She has a normal mood and affect.  Nursing note and vitals reviewed.    ED Treatments / Results  Labs (all labs ordered are listed, but only abnormal results are  displayed) Labs Reviewed  BASIC METABOLIC PANEL - Abnormal; Notable for the following components:      Result Value   Glucose, Bld 106 (*)    BUN 31 (*)    Creatinine, Ser 1.06 (*)    GFR calc non Af Amer 45 (*)    GFR calc Af Amer 52 (*)    All other components within normal limits  CBC - Abnormal; Notable for the following components:   WBC 12.3 (*)    RBC 5.18 (*)    Hemoglobin 16.3 (*)    HCT 48.0 (*)    Platelets 122 (*)    All other components within normal limits  I-STAT CHEM 8, ED - Abnormal; Notable for the following components:   BUN 34 (*)    Glucose, Bld 105 (*)    Hemoglobin 16.0 (*)    HCT 47.0 (*)    All other components within normal limits  URINALYSIS, ROUTINE W REFLEX MICROSCOPIC  CBG MONITORING, ED  I-STAT TROPONIN, ED    EKG  EKG Interpretation  Date/Time:  Thursday May 12 2017 10:40:38 EST Ventricular Rate:  73 PR Interval:    QRS Duration: 172 QT Interval:  475 QTC Calculation: 628 R Axis:   104 Text Interpretation:  A-V dual-paced complexes w/ some inhibition No further analysis attempted due to paced rhythm Baseline wander in lead(s) V5 No significant change since last tracing Confirmed by Gareth Morgan [phone]) on 05/12/2017 10:53:18 AM       Radiology Ct Head Wo Contrast  Result Date: 05/12/2017 CLINICAL DATA:  Fall. EXAM: CT HEAD WITHOUT CONTRAST CT CERVICAL SPINE WITHOUT CONTRAST TECHNIQUE: Multidetector CT imaging of the head and cervical spine was performed following the standard protocol without intravenous contrast. Multiplanar CT image reconstructions of the cervical spine were also generated. COMPARISON:  CT scan of July 15, 2015. Chest radiograph of May 08, 2017. FINDINGS: CT HEAD FINDINGS Brain: Mild diffuse cortical atrophy is noted. Mild chronic ischemic white matter disease is noted. Old left occipital infarction is noted. No mass effect or midline shift is noted. Ventricular size is within normal limits. There is no  evidence of mass lesion, hemorrhage or acute infarction. Vascular: No hyperdense vessel or unexpected calcification. Skull: Normal. Negative for  fracture or focal lesion. Sinuses/Orbits: Left ethmoid and sphenoid sinusitis is noted. Other: None. CT CERVICAL SPINE FINDINGS Alignment: Normal. Skull base and vertebrae: Interval development of moderate compression deformity involving C7 vertebral body which was present on prior radiograph of May 08, 2017, and therefore is felt to most likely represent old fracture. No acute fracture is noted. Soft tissues and spinal canal: No prevertebral fluid or swelling. No visible canal hematoma. Disc levels:  Disc spaces are well-maintained. Upper chest: Negative. Other: Degenerative changes are seen involving the left-sided posterior facet joints. IMPRESSION: Mild diffuse cortical atrophy. Mild chronic ischemic white matter disease. Left ethmoid and sphenoid sinusitis. No acute intracranial abnormality seen. Probable old C7 compression fracture is noted. No acute abnormality seen in the cervical spine. Electronically Signed   By: Marijo Conception, M.D.   On: 05/12/2017 11:53   Ct Cervical Spine Wo Contrast  Result Date: 05/12/2017 CLINICAL DATA:  Fall. EXAM: CT HEAD WITHOUT CONTRAST CT CERVICAL SPINE WITHOUT CONTRAST TECHNIQUE: Multidetector CT imaging of the head and cervical spine was performed following the standard protocol without intravenous contrast. Multiplanar CT image reconstructions of the cervical spine were also generated. COMPARISON:  CT scan of July 15, 2015. Chest radiograph of May 08, 2017. FINDINGS: CT HEAD FINDINGS Brain: Mild diffuse cortical atrophy is noted. Mild chronic ischemic white matter disease is noted. Old left occipital infarction is noted. No mass effect or midline shift is noted. Ventricular size is within normal limits. There is no evidence of mass lesion, hemorrhage or acute infarction. Vascular: No hyperdense vessel or unexpected  calcification. Skull: Normal. Negative for fracture or focal lesion. Sinuses/Orbits: Left ethmoid and sphenoid sinusitis is noted. Other: None. CT CERVICAL SPINE FINDINGS Alignment: Normal. Skull base and vertebrae: Interval development of moderate compression deformity involving C7 vertebral body which was present on prior radiograph of May 08, 2017, and therefore is felt to most likely represent old fracture. No acute fracture is noted. Soft tissues and spinal canal: No prevertebral fluid or swelling. No visible canal hematoma. Disc levels:  Disc spaces are well-maintained. Upper chest: Negative. Other: Degenerative changes are seen involving the left-sided posterior facet joints. IMPRESSION: Mild diffuse cortical atrophy. Mild chronic ischemic white matter disease. Left ethmoid and sphenoid sinusitis. No acute intracranial abnormality seen. Probable old C7 compression fracture is noted. No acute abnormality seen in the cervical spine. Electronically Signed   By: Marijo Conception, M.D.   On: 05/12/2017 11:53   Dg Knee Complete 4 Views Right  Result Date: 05/12/2017 CLINICAL DATA:  Status post fall while standing. EXAM: RIGHT KNEE - COMPLETE 4+ VIEW COMPARISON:  None. FINDINGS: No acute fracture or dislocation. Generalized osteopenia. Chondrocalcinosis of the medial and lateral femorotibial compartments as can be seen with CPPD. IMPRESSION: No acute osseous injury of the right knee. Electronically Signed   By: Kathreen Devoid   On: 05/12/2017 11:40    Procedures Procedures (including critical care time)  Medications Ordered in ED Medications  sodium chloride 0.9 % bolus 500 mL (0 mLs Intravenous Stopped 05/12/17 1145)     Initial Impression / Assessment and Plan / ED Course  I have reviewed the triage vital signs and the nursing notes.  Pertinent labs & imaging results that were available during my care of the patient were reviewed by me and considered in my medical decision making (see chart for  details).     BP (!) 163/73   Pulse 60   Temp 98.6 F (37 C) (Oral)  Resp (!) 27   SpO2 95%    Final Clinical Impressions(s) / ED Diagnoses   Final diagnoses:  Fall at home, initial encounter    ED Discharge Orders        [place]     05/12/17 1514    Face-to-face encounter (required for Medicare/Medicaid patients)    Comments:  I Domenic Moras certify that this patient is under my care and that I, or a nurse practitioner or physician's assistant working with me, had a face-to-face encounter that meets the physician face-to-face encounter requirements with this patient on 05/12/2017. The encounter with the patient was in whole, or in part for the following medical condition(s) which is the primary reason for home health care (List medical condition): recurrent falls, dementia, weakness   05/12/17 1514     11:10 AM Patient fell earlier today while trying to get off her wheelchair to go to the bathroom.  She did hit her head and complaining of right knee pain.  Given her age, will obtain appropriate imaging.  Workup initiated which includes EKG, UA, and basic labs.  She is in no acute discomfort and no significant signs of injury noted on exam.  2:32 PM Head CT and cervical spine CT without acute abnormalities.  Chronic cervical spine fracture noted.  X-ray of right knee without acute fracture or dislocation.  Patient's labs are at her baseline.  She was able to ambulate with some assist.  Appreciate evaluation by social worker who will help coordinate outpatient care, likely with a 24 hours nurse while family will work on finding placement for her.  Otherwise she does not meet inpatient criteria. Care discussed with DR. Schlossman.  3:16 PM  Social worker recommend PT/OT/RN/homehealth aid and social work face-to-face.  Order have been placed.  Pt stable for discharge with family.  4:17 PM Prior to discharge Dr Billy Fischer evaluated pt and noted pt was bradycardic.  Pt  does have a dual pacer, and when reviewing the rhythm strip it is unclear if she is bradycardic or if the vpace is not being picked up appropriately.  Currently awaits pace maker interrogator to interrogate the pacer.  If pt does have bradycardia then she will need to be admitted for further management.  Care discussed with oncoming provider who will f/u on the result.  If no complication, then pt can be discharge.        Domenic Moras, PA-C 05/12/17 1619    Gareth Morgan, MD 05/12/17 [phone]

## 2017-05-12 NOTE — ED Notes (Signed)
Placed patient on the bedpan got urine sample patient is resting with family at bedside and call bell in reach

## 2017-05-13 ENCOUNTER — Emergency Department (HOSPITAL_COMMUNITY): Payer: Medicare Other

## 2017-05-13 ENCOUNTER — Other Ambulatory Visit: Payer: Self-pay

## 2017-05-13 ENCOUNTER — Emergency Department (HOSPITAL_COMMUNITY)
Admission: EM | Admit: 2017-05-13 | Discharge: 2017-05-27 | Disposition: E | Payer: Medicare Other | Attending: Emergency Medicine | Admitting: Emergency Medicine

## 2017-05-13 DIAGNOSIS — S0990XA Unspecified injury of head, initial encounter: Secondary | ICD-10-CM | POA: Diagnosis not present

## 2017-05-13 DIAGNOSIS — I469 Cardiac arrest, cause unspecified: Secondary | ICD-10-CM

## 2017-05-13 DIAGNOSIS — S199XXA Unspecified injury of neck, initial encounter: Secondary | ICD-10-CM | POA: Diagnosis not present

## 2017-05-13 DIAGNOSIS — R4020 Unspecified coma: Secondary | ICD-10-CM | POA: Insufficient documentation

## 2017-05-13 DIAGNOSIS — R079 Chest pain, unspecified: Secondary | ICD-10-CM | POA: Diagnosis not present

## 2017-05-13 DIAGNOSIS — W19XXXA Unspecified fall, initial encounter: Secondary | ICD-10-CM

## 2017-05-13 LAB — CBG MONITORING, ED: GLUCOSE-CAPILLARY: 222 mg/dL — AB (ref 65–99)

## 2017-05-13 MED ORDER — MORPHINE SULFATE (PF) 4 MG/ML IV SOLN
2.0000 mg | INTRAVENOUS | Status: DC | PRN
Start: 2017-05-13 — End: 2017-05-14
  Administered 2017-05-13: 2 mg via INTRAVENOUS
  Filled 2017-05-13: qty 1

## 2017-05-13 MED ORDER — MORPHINE SULFATE (PF) 4 MG/ML IV SOLN
INTRAVENOUS | Status: AC
  Administered 2017-05-13: 13:00:00
  Filled 2017-05-13: qty 1

## 2017-05-13 MED FILL — Medication: Qty: 1 | Status: AC

## 2017-05-27 NOTE — ED Provider Notes (Signed)
[facility] EMERGENCY DEPARTMENT Provider Note   CSN: [id number] Arrival date & time: May 16, 2017  9381     History   Chief Complaint Chief Complaint  Patient presents with  . Cardiac Arrest    HPI [name] is a 84 y.o. female.  HPI  EMS in extremis. EMS reports that the patient lives at home with family, and today was found outside down a hill in the woods upside down by family members. Reportedly the patient was last seen normal, at least 8 hours prior to being found. Overnight the patient seemingly went outside, and was found on the ground, unresponsive. EMS notes that on arrival the patient had agonal respiration, but no sustained interactivity, and CPR commenced soon after transport began. Level 5 caveat secondary to the patient's acuity condition. EMS is unsure of the patient's medical history, note that family states that she has a pacemaker in place.   No past medical history on file.  There are no active problems to display for this patient.    The histories are not reviewed yet. Please review them in the "History" navigator section and refresh this [place].  OB History    No data available       Home Medications    Prior to Admission medications   Not on File    Family History No family history on file.  Social History Social History   Tobacco Use  . Smoking status: Not on file  Substance Use Topics  . Alcohol use: Not on file  . Drug use: Not on file     Allergies   Patient has no allergy information on record.   Review of Systems Review of Systems  Unable to perform ROS: Acuity of condition     Physical Exam Updated Vital Signs Wt 63.5 kg (140 lb)   Physical Exam  Constitutional:  Unresponsive elderly female with substantial leaves, dirt, debris all over, with mottled, and erythematous skin on her arms and legs, multiple abrasions.   HENT:  Head: Atraumatic.  Eyes:  Pupils nonreactive,  enlarged pupils, does not track  Cardiovascular:  Faint pulses minimally appreciable intermittently  Pulmonary/Chest:  Appreciable breath sounds bilaterally with mechanical ventilation.  Abdominal: She exhibits no distension.  Musculoskeletal: She exhibits edema.  No appreciable deformity, but edema, erythema, multiple abrasions, throughout her habitus, specifically her arms and legs.  Neurological:  Unresponsive  Skin:  As above, multiple abrasions, substantial erythema, edema, no obviously open lacerations requiring repair  Psychiatric:  Unresponsive  Nursing note and vitals reviewed.    ED Treatments / Results  Labs (all labs ordered are listed, but only abnormal results are displayed) Labs Reviewed  CBG MONITORING, ED - Abnormal; Notable for the following components:      Result Value   Glucose-Capillary 222 (*)    All other components within normal limits  COMPREHENSIVE METABOLIC PANEL  MAGNESIUM  TROPONIN I  BRAIN NATRIURETIC PEPTIDE  CBC WITH DIFFERENTIAL/PLATELET  PROTIME-INR  URINALYSIS, ROUTINE W REFLEX MICROSCOPIC    EKG  EKG Interpretation  Date/Time:  05/16/2017 09:46:14 EST Ventricular Rate:  65 PR Interval:    QRS Duration: 163 QT Interval:  530 QTC Calculation: 552 R Axis:   -67 Text Interpretation:  Sinus or ectopic atrial rhythm Consider right atrial enlargement Nonspecific IVCD with LAD Inferior infarct, old Abnormal ekg Confirmed by Carmin Muskrat [phone]) on 05/16/17 10:57:49 AM       Radiology Ct Head Wo Contrast  Result  Date: 2017-05-17 CLINICAL DATA:  Polytrauma.  Cervical spine injury suspected. EXAM: CT HEAD WITHOUT CONTRAST CT CERVICAL SPINE WITHOUT CONTRAST TECHNIQUE: Multidetector CT imaging of the head and cervical spine was performed following the standard protocol without intravenous contrast. Multiplanar CT image reconstructions of the cervical spine were also generated. COMPARISON:  None. FINDINGS: CT HEAD FINDINGS  Brain: There is no evidence of acute intracranial hemorrhage, mass lesion, brain edema or extra-axial fluid collection. There is age-appropriate atrophy with prominence of the ventricles and subarachnoid spaces. There is an old infarct in the left PCA distribution and confluent low-density in the periventricular and subcortical white matter, consistent with chronic small vessel ischemic changes. There is no CT evidence of acute cortical infarction. Vascular: Intracranial vascular calcifications. No hyperdense vessel identified. Skull: Negative for fracture or focal lesion. Sinuses/Orbits: The left sphenoid and ethmoid sinuses are opacified, likely chronic. No air-fluid levels identified. The mastoid air cells and middle ears are clear. Other: None. CT CERVICAL SPINE FINDINGS Alignment: Straightening without focal angulation or significant listhesis. Skull base and vertebrae: C7 compression deformity predominantly involves the inferior endplate and results in approximately 30% loss of vertebral body height. No osseous retropulsion or discrete cortical fracture identified at this level. The anterior inferior corner of the C5 vertebral body appears slightly irregular on the sagittal images, probably due to a fragmented osteophyte, although potentially a small teardrop injury. The posterior elements are intact. The left C2-3 facet joint is ankylosed. The posterior arch of C1 is incomplete on the right. Soft tissues and spinal canal: No prevertebral fluid or swelling. No visible canal hematoma. Disc levels: Only mild degenerative changes are present with uncinate spurring and facet hypertrophy. No high-grade spinal stenosis. Degenerative changes are present anteriorly at C1-2. Upper chest: Patient is intubated. Extensive airspace and ground-glass opacities within the visualized upper lobes as seen on preceding radiographs. Patient has a pacemaker. Other: None. IMPRESSION: 1. No acute intracranial or calvarial findings.  2. 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 3. C7 compression fracture, age indeterminate. This could be acute. In addition, the possibility of a tear drop injury involving the anterior inferior corner of C5 is raised (without a clear associated soft tissue swelling). Immobilization and follow-up recommended. 4. These results were called by telephone at the time of interpretation on May 17, 2017 at 10:37 am to Dr. Carmin Muskrat , who verbally acknowledged these results. Electronically Signed   By: Richardean Sale M.D.   On: 2017-05-17 10:38   Ct Cervical Spine Wo Contrast  Result Date: May 17, 2017 CLINICAL DATA:  Polytrauma.  Cervical spine injury suspected. EXAM: CT HEAD WITHOUT CONTRAST CT CERVICAL SPINE WITHOUT CONTRAST TECHNIQUE: Multidetector CT imaging of the head and cervical spine was performed following the standard protocol without intravenous contrast. Multiplanar CT image reconstructions of the cervical spine were also generated. COMPARISON:  None. FINDINGS: CT HEAD FINDINGS Brain: There is no evidence of acute intracranial hemorrhage, mass lesion, brain edema or extra-axial fluid collection. There is age-appropriate atrophy with prominence of the ventricles and subarachnoid spaces. There is an old infarct in the left PCA distribution and confluent low-density in the periventricular and subcortical white matter, consistent with chronic small vessel ischemic changes. There is no CT evidence of acute cortical infarction. Vascular: Intracranial vascular calcifications. No hyperdense vessel identified. Skull: Negative for fracture or focal lesion. Sinuses/Orbits: The left sphenoid and ethmoid sinuses are opacified, likely chronic. No air-fluid levels identified. The mastoid air cells and middle ears are clear. Other: None. CT CERVICAL  SPINE FINDINGS Alignment: Straightening without focal angulation or significant listhesis. Skull base and vertebrae: C7 compression deformity  predominantly involves the inferior endplate and results in approximately 30% loss of vertebral body height. No osseous retropulsion or discrete cortical fracture identified at this level. The anterior inferior corner of the C5 vertebral body appears slightly irregular on the sagittal images, probably due to a fragmented osteophyte, although potentially a small teardrop injury. The posterior elements are intact. The left C2-3 facet joint is ankylosed. The posterior arch of C1 is incomplete on the right. Soft tissues and spinal canal: No prevertebral fluid or swelling. No visible canal hematoma. Disc levels: Only mild degenerative changes are present with uncinate spurring and facet hypertrophy. No high-grade spinal stenosis. Degenerative changes are present anteriorly at C1-2. Upper chest: Patient is intubated. Extensive airspace and ground-glass opacities within the visualized upper lobes as seen on preceding radiographs. Patient has a pacemaker. Other: None. IMPRESSION: 1. No acute intracranial or calvarial findings. 2. 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 3. C7 compression fracture, age indeterminate. This could be acute. In addition, the possibility of a tear drop injury involving the anterior inferior corner of C5 is raised (without a clear associated soft tissue swelling). Immobilization and follow-up recommended. 4. These results were called by telephone at the time of interpretation on 2017/06/08 at 10:37 am to Dr. Carmin Muskrat , who verbally acknowledged these results. Electronically Signed   By: Richardean Sale M.D.   On: June 08, 2017 10:38   Dg Chest Portable 1 View  Result Date: 08-Jun-2017 CLINICAL DATA:  Chest pain.  CPR. EXAM: PORTABLE CHEST 1 VIEW COMPARISON:  None. FINDINGS: Endotracheal tube tip is 3.6 cm from the carina. Double lead left subclavian device and leads are intact. Extensive bilateral upper lung zone consolidation. No pneumothorax. No pleural effusion.  Normal heart size. IMPRESSION: Endotracheal tube. Bilateral upper lobe consolidation. Electronically Signed   By: Marybelle Killings M.D.   On: June 08, 2017 10:03    Procedures Procedures (including critical care time)  Medications Ordered in ED Medications - No data to display Immediately after initial evaluation with concern for respiratory, cardiac arrest, the patient continued to receive CPR. After several rounds of ACLS protocol, with one ultrasound to confirm ongoing cardiac activity, the patient continued to have only episodic periods of sustained spontaneous rhythm.     EMERGENCY DEPARTMENT [place] CARDIAC EXAM "Study: Limited Ultrasound of the Heart and Pericardium"  INDICATIONS:Abnormal vital signs and Cardiac arrest Multiple views of the heart and pericardium were obtained in real-time with a multi-frequency probe.  PERFORMED OE:VOJJKK IMAGES ARCHIVED?: No LIMITATIONS:  Emergent procedure VIEWS USED: Parasternal long axis and Parasternal short axis INTERPRETATION: Decreased contractility   Cardiopulmonary Resuscitation (CPR) Procedure Note Directed/Performed by: Carmin Muskrat I personally directed ancillary staff and/or performed CPR in an effort to regain return of spontaneous circulation and to maintain cardiac, neuro and systemic perfusion.    Subsequent, the patient continued to receive fluids, and after additional CPR had sustained pulses, though this was at a point during which the patient had already received CPR and been unresponsive for at least 30 minutes, and with initial hypothermia of temperature greater than 75, resuscitation efforts were considered for cessation.  Patient at that point had return of spontaneous circulation, with a minimally appreciable pulse that was intermittent, in the 40/50 range, with occasional respiration.  The patient was transferred to the mechanical ventilator.  11:01 AM After lengthy conversation with family members about all findings thus  far, her history, her baseline medical condition, and today's presentation due to cardiac arrest, after being found outside, with substantial hypothermia, they knowledge the patient has previously expressed DNR preferences, including no prolonged resuscitation, no artificial life support. Patient will have extubation, and additional resuscitation measures will be stopped.  1:18 PM TOD 1310 ME aware, given the nature of the patient's presentation, after a fall, with eventual death.  Medical examiner suggest that the patient is a medical death, does not require additional evaluation.  I discussed the patient's death with her primary care team for signing the death certificate.  This elderly female with multiple medical issues presents after being found down, unresponsive, hypothermic. After initial resuscitation, patient did have some return of spontaneous circulation, but no return to substantial interactivity. After discussion with family members, further resuscitation efforts were stopped, and after several hours the patient had no ongoing spontaneous cardiac activity.  Final Clinical Impressions(s) / ED Diagnoses  Cardiac arrest Fall, initial encounter  CRITICAL CARE Performed by: Carmin Muskrat Total critical care time: 40 minutes Critical care time was exclusive of separately billable procedures and treating other patients. Critical care was necessary to treat or prevent imminent or life-threatening deterioration. Critical care was time spent personally by me on the following activities: development of treatment plan with patient and/or surrogate as well as nursing, discussions with consultants, evaluation of patient's response to treatment, examination of patient, obtaining history from patient or surrogate, ordering and performing treatments and interventions, ordering and review of laboratory studies, ordering and review of radiographic studies, pulse oximetry and re-evaluation of  patient's condition.    Carmin Muskrat, MD 25-May-2017 1339

## 2017-05-27 NOTE — ED Notes (Signed)
Dr Vanita Panda at bedside.  Spoke with family resp very agonal and swallow.

## 2017-05-27 NOTE — ED Notes (Signed)
Patient remains with agonal swallow resp. Family at bedside. Watch removed from patient and given to daughter.

## 2017-05-27 NOTE — ED Notes (Addendum)
3716  Arrived to ed via [facility] states cpr in progress, family States they  saw the back unlatched at apporx. 3am  Re latched the  Door and went back to bed not thinking anything abnormal  . This am noticed she wasn't in her room and found her outside down a 15 ft. Enbankment. Per ems patient's walker was at the to of the hill and she was lying in a fetal position at the bottom of the enbankment in a pile of leaves. Agonal resp and several periods of moaning. Once patient was placed in the ems unit she went into PEA,  Patient had 30 minutes of CPR PTA. EMS gave 7 epip and was shocked. X 2. Versed 2 mg.intubated with 7.5 ETT  IO right tib/fib.   Upon arrival CPR in progress via lucas, Dr. Vanita Panda at bedside.  0916am pulse check no pulse felt lucas restarted. Patient has multiple skin tears on both upper and lower extremities. Skin is very cold to touch.  9678 pulse check DR Vanita Panda used u/s saw slight cardiac activity CPR re-started.  [place]. X 1 given 0923 unable to feel pulses Dr. Vanita Panda discontinued all efforts.  0944 patient with agonal resp. Roc. Placed on vent o2 100%- rate 20 , TV 500 5peep.  Heart rhythm wide junctional.  1005 transported to CT 1020 Back to Trauma C. HR  SAT 96% /bp 46/20 Dr. Vanita Panda spoke with family sister, daughter and granddaughter and they stated patient was a DNR and wished for comfort care.  1110 family at bedside.

## 2017-05-27 NOTE — Procedures (Signed)
Extubation Procedure Note  Patient Details:   Name: [name] DOB: 1925-02-13 MRN: [id number]   Airway Documentation:     Evaluation  O2 sats: stable throughout Complications: Terminal Extubation Patient did tolerate procedure well.     No  [name] May 31, 2017, 11:14 AM

## 2017-05-27 NOTE — Progress Notes (Signed)
Patient came in intubated by EMS with a 7.5 ETT taped at 24 cm at the lip, good BBS ausculted, SATS 100%, X-Ray confirmed placement, placed on above vent settings, MD aware.

## 2017-05-27 DEATH — deceased
